# Patient Record
Sex: Female | Born: 1955 | Race: White | Hispanic: No | Marital: Married | Smoking: Former smoker
Health system: Southern US, Community
[De-identification: ages and names within clinical notes are randomized; demographics above are authoritative.]

## PROBLEM LIST (undated history)

## (undated) DIAGNOSIS — L570 Actinic keratosis: Secondary | ICD-10-CM

## (undated) DIAGNOSIS — E785 Hyperlipidemia, unspecified: Secondary | ICD-10-CM

## (undated) DIAGNOSIS — C801 Malignant (primary) neoplasm, unspecified: Secondary | ICD-10-CM

## (undated) DIAGNOSIS — K219 Gastro-esophageal reflux disease without esophagitis: Secondary | ICD-10-CM

## (undated) DIAGNOSIS — G43909 Migraine, unspecified, not intractable, without status migrainosus: Secondary | ICD-10-CM

## (undated) HISTORY — PX: CERVICAL CERCLAGE: SHX1329

## (undated) HISTORY — DX: Gastro-esophageal reflux disease without esophagitis: K21.9

## (undated) HISTORY — DX: Migraine, unspecified, not intractable, without status migrainosus: G43.909

## (undated) HISTORY — DX: Hyperlipidemia, unspecified: E78.5

## (undated) HISTORY — DX: Actinic keratosis: L57.0

---

## 1992-07-09 HISTORY — PX: CHOLECYSTECTOMY: SHX55

## 1994-07-09 HISTORY — PX: HERNIA REPAIR: SHX51

## 1994-07-09 HISTORY — PX: OTHER SURGICAL HISTORY: SHX169

## 1996-07-09 HISTORY — PX: EYE SURGERY: SHX253

## 2003-07-10 HISTORY — PX: LEFT OOPHORECTOMY: SHX1961

## 2004-04-21 ENCOUNTER — Ambulatory Visit: Payer: Self-pay | Admitting: Specialist

## 2004-04-26 ENCOUNTER — Ambulatory Visit: Payer: Self-pay | Admitting: Specialist

## 2004-06-06 ENCOUNTER — Ambulatory Visit: Payer: Self-pay | Admitting: Unknown Physician Specialty

## 2004-06-12 ENCOUNTER — Ambulatory Visit: Payer: Self-pay | Admitting: Unknown Physician Specialty

## 2005-05-22 ENCOUNTER — Ambulatory Visit: Payer: Self-pay | Admitting: Specialist

## 2006-06-06 ENCOUNTER — Ambulatory Visit: Payer: Self-pay | Admitting: Specialist

## 2007-05-13 ENCOUNTER — Ambulatory Visit: Payer: Self-pay | Admitting: Specialist

## 2007-06-10 ENCOUNTER — Ambulatory Visit: Payer: Self-pay | Admitting: Specialist

## 2008-04-13 DIAGNOSIS — C4491 Basal cell carcinoma of skin, unspecified: Secondary | ICD-10-CM

## 2008-04-13 HISTORY — DX: Basal cell carcinoma of skin, unspecified: C44.91

## 2008-06-10 ENCOUNTER — Ambulatory Visit: Payer: Self-pay | Admitting: Specialist

## 2008-06-16 ENCOUNTER — Ambulatory Visit: Payer: Self-pay | Admitting: Specialist

## 2009-03-08 DIAGNOSIS — C44310 Basal cell carcinoma of skin of unspecified parts of face: Secondary | ICD-10-CM

## 2009-03-08 HISTORY — DX: Basal cell carcinoma of skin of unspecified parts of face: C44.310

## 2009-06-14 ENCOUNTER — Ambulatory Visit: Payer: Self-pay | Admitting: Specialist

## 2010-06-27 ENCOUNTER — Ambulatory Visit: Payer: Self-pay | Admitting: Specialist

## 2011-07-05 ENCOUNTER — Ambulatory Visit: Payer: Self-pay | Admitting: Specialist

## 2012-04-11 ENCOUNTER — Ambulatory Visit: Payer: Self-pay

## 2012-07-07 ENCOUNTER — Ambulatory Visit: Payer: Self-pay | Admitting: Specialist

## 2012-07-10 ENCOUNTER — Ambulatory Visit: Payer: Self-pay | Admitting: Specialist

## 2013-07-08 ENCOUNTER — Ambulatory Visit: Payer: Self-pay | Admitting: Specialist

## 2014-03-29 DIAGNOSIS — C44519 Basal cell carcinoma of skin of other part of trunk: Secondary | ICD-10-CM

## 2014-03-29 HISTORY — DX: Basal cell carcinoma of skin of other part of trunk: C44.519

## 2014-07-20 ENCOUNTER — Ambulatory Visit: Payer: Self-pay | Admitting: Specialist

## 2014-09-13 ENCOUNTER — Ambulatory Visit: Payer: Self-pay | Admitting: Specialist

## 2014-11-16 ENCOUNTER — Other Ambulatory Visit: Payer: Self-pay | Admitting: Unknown Physician Specialty

## 2014-11-16 DIAGNOSIS — R140 Abdominal distension (gaseous): Secondary | ICD-10-CM

## 2014-11-17 ENCOUNTER — Other Ambulatory Visit: Payer: Self-pay

## 2014-11-17 ENCOUNTER — Ambulatory Visit
Admission: RE | Admit: 2014-11-17 | Discharge: 2014-11-17 | Disposition: A | Discharge status: Home / Self Care | Payer: PRIVATE HEALTH INSURANCE | Source: Ambulatory Visit | Attending: Unknown Physician Specialty | Admitting: Unknown Physician Specialty

## 2014-11-17 DIAGNOSIS — Z90721 Acquired absence of ovaries, unilateral: Secondary | ICD-10-CM | POA: Diagnosis not present

## 2014-11-17 DIAGNOSIS — R140 Abdominal distension (gaseous): Secondary | ICD-10-CM | POA: Diagnosis not present

## 2014-11-19 ENCOUNTER — Other Ambulatory Visit: Payer: Self-pay

## 2014-11-19 ENCOUNTER — Ambulatory Visit: Payer: Self-pay

## 2014-12-13 ENCOUNTER — Ambulatory Visit: Payer: PRIVATE HEALTH INSURANCE | Admitting: Anesthesiology

## 2014-12-13 ENCOUNTER — Encounter
Admission: RE | Disposition: A | Discharge status: Home / Self Care | Payer: Self-pay | Source: Ambulatory Visit | Attending: Unknown Physician Specialty

## 2014-12-13 ENCOUNTER — Encounter: Payer: Self-pay | Admitting: Anesthesiology

## 2014-12-13 ENCOUNTER — Ambulatory Visit
Admission: RE | Admit: 2014-12-13 | Discharge: 2014-12-13 | Disposition: A | Discharge status: Home / Self Care | Payer: PRIVATE HEALTH INSURANCE | Source: Ambulatory Visit | Attending: Unknown Physician Specialty | Admitting: Unknown Physician Specialty

## 2014-12-13 DIAGNOSIS — D125 Benign neoplasm of sigmoid colon: Secondary | ICD-10-CM | POA: Diagnosis not present

## 2014-12-13 DIAGNOSIS — I10 Essential (primary) hypertension: Secondary | ICD-10-CM | POA: Diagnosis not present

## 2014-12-13 DIAGNOSIS — E785 Hyperlipidemia, unspecified: Secondary | ICD-10-CM | POA: Insufficient documentation

## 2014-12-13 DIAGNOSIS — Z1211 Encounter for screening for malignant neoplasm of colon: Secondary | ICD-10-CM | POA: Insufficient documentation

## 2014-12-13 DIAGNOSIS — Z79899 Other long term (current) drug therapy: Secondary | ICD-10-CM | POA: Insufficient documentation

## 2014-12-13 DIAGNOSIS — K648 Other hemorrhoids: Secondary | ICD-10-CM | POA: Diagnosis not present

## 2014-12-13 DIAGNOSIS — Z87891 Personal history of nicotine dependence: Secondary | ICD-10-CM | POA: Diagnosis not present

## 2014-12-13 DIAGNOSIS — Z800 Family history of malignant neoplasm of digestive organs: Secondary | ICD-10-CM | POA: Insufficient documentation

## 2014-12-13 DIAGNOSIS — Z7982 Long term (current) use of aspirin: Secondary | ICD-10-CM | POA: Diagnosis not present

## 2014-12-13 DIAGNOSIS — Z91041 Radiographic dye allergy status: Secondary | ICD-10-CM | POA: Insufficient documentation

## 2014-12-13 HISTORY — PX: COLONOSCOPY: SHX5424

## 2014-12-13 SURGERY — COLONOSCOPY
Anesthesia: General

## 2014-12-13 MED ORDER — FENTANYL CITRATE (PF) 100 MCG/2ML IJ SOLN
INTRAMUSCULAR | Status: DC | PRN
Start: 2014-12-13 — End: 2014-12-13
  Administered 2014-12-13: 50 ug via INTRAVENOUS

## 2014-12-13 MED ORDER — PROPOFOL 10 MG/ML IV BOLUS
INTRAVENOUS | Status: DC | PRN
  Administered 2014-12-13: 60 mg via INTRAVENOUS

## 2014-12-13 MED ORDER — PROPOFOL INFUSION 10 MG/ML OPTIME
INTRAVENOUS | Status: DC | PRN
  Administered 2014-12-13: 120 ug/kg/min via INTRAVENOUS

## 2014-12-13 MED ORDER — MIDAZOLAM HCL 2 MG/2ML IJ SOLN
INTRAMUSCULAR | Status: DC | PRN
  Administered 2014-12-13 (×2): 1 mg via INTRAVENOUS

## 2014-12-13 MED ORDER — SODIUM CHLORIDE 0.9 % IV SOLN: INTRAVENOUS | Status: DC

## 2014-12-13 MED ORDER — SODIUM CHLORIDE 0.9 % IV SOLN
INTRAVENOUS | Status: DC
  Administered 2014-12-13: 14:00:00 via INTRAVENOUS

## 2014-12-13 MED ORDER — LIDOCAINE HCL (CARDIAC) 20 MG/ML IV SOLN
INTRAVENOUS | Status: DC | PRN
  Administered 2014-12-13: 60 mg via INTRAVENOUS

## 2014-12-13 NOTE — Op Note (Signed)
[facility] Gastroenterology Patient Name: Brianna Calhoun Procedure Date: 12/13/2014 2:09 PM MRN: [id number] Account #: [id number] Date of Birth: 10/26/1955 Admit Type: Outpatient Age: 59 Room: [facility] ENDO ROOM 1 Gender: Female Note Status: Finalized Procedure:         Colonoscopy Indications:       Screening patient at increased risk: Family history of                     1st-degree relative with colorectal cancer at age 22 years                     (or older) Providers:         Manya Silvas, MD Referring MD:      Lucas Mallow, MD (Referring MD) Medicines:         Propofol per Anesthesia Complications:     No immediate complications. Procedure:         Pre-Anesthesia Assessment:                    - After reviewing the risks and benefits, the patient was                     deemed in satisfactory condition to undergo the procedure.                    After obtaining informed consent, the colonoscope was                     passed under direct vision. Throughout the procedure, the                     patient's blood pressure, pulse, and oxygen saturations                     were monitored continuously. The Olympus PCF-H180AL                     colonoscope ( S#: [id number] ) was introduced through the                     anus and advanced to the the cecum, identified by                     appendiceal orifice and ileocecal valve. The colonoscopy                     was performed without difficulty. The patient tolerated                     the procedure well. The quality of the bowel preparation                     was excellent. Findings:      A diminutive polyp was found in the recto-sigmoid colon. The polyp was       sessile. The polyp was removed with a jumbo cold forceps. Resection and       retrieval were complete.      Internal hemorrhoids were found during endoscopy. The hemorrhoids were       medium-sized and Grade I (internal hemorrhoids that do  not prolapse).      The exam was otherwise without abnormality. Impression:        - One diminutive polyp at the recto-sigmoid  colon.                     Resected and retrieved.                    - Internal hemorrhoids.                    - The examination was otherwise normal. Recommendation:    - Await pathology results. Manya Silvas, MD 12/13/2014 2:55:55 PM This report has been signed electronically. Number of Addenda: 0 Note Initiated On: 12/13/2014 2:09 PM Scope Withdrawal Time: 0 hours 16 minutes 9 seconds  Total Procedure Duration: 0 hours 24 minutes 16 seconds       [facility]

## 2014-12-13 NOTE — H&P (Signed)
Primary Care Physician:  No PCP Per Patient Primary Gastroenterologist:  Dr. Vira Agar  Pre-Procedure History & Physical: HPI:  Brianna Calhoun is a 59 y.o. female is here for an colonoscopy.   History reviewed. No pertinent past medical history.  Past Surgical History  Procedure Laterality Date  . Cholecystectomy      Prior to Admission medications   Medication Sig Start Date End Date Taking? Authorizing Provider  ALPRAZolam Duanne Moron) 0.25 MG tablet Take 0.25 mg by mouth at bedtime as needed for anxiety.   Yes Historical Provider, MD  aspirin EC 81 MG tablet Take 81 mg by mouth 2 (two) times daily.   Yes Historical Provider, MD  butalbital-aspirin-caffeine [facility]) 50-325-40 MG per capsule Take 1-2 capsules by mouth every 4 (four) hours as needed for migraine.  12/03/13  Yes Historical Provider, MD  cycloSPORINE (RESTASIS) 0.05 % ophthalmic emulsion Place 1 drop into both eyes 2 (two) times daily. 12/31/13  Yes Historical Provider, MD  escitalopram (LEXAPRO) 10 MG tablet Take 10 mg by mouth daily.   Yes Historical Provider, MD  naproxen sodium (ANAPROX) 220 MG tablet Take 440 mg by mouth 2 (two) times daily as needed (headache).   Yes Historical Provider, MD  NON FORMULARY Take 1 capsule by mouth daily. T3-25, T4-60   Yes Historical Provider, MD  NON FORMULARY Take 25,000 Units by mouth every Monday, Wednesday, and Friday. Vitamin D from compounding pharmacy   Yes Historical Provider, MD  NON FORMULARY Apply 1 application topically 2 (two) times daily. Bi-est 70/30 5mg /ml. Estrogen supplement   Yes Historical Provider, MD  NON FORMULARY Take 1 tablet by mouth daily. Hydroxocobalamin /folic acid/ pyroxidine   Yes Historical Provider, MD  NON FORMULARY Take 2 capsules by mouth 2 (two) times daily. cylysine   Yes Historical Provider, MD  NON FORMULARY Take 6.25 mg by mouth daily. ioradol   Yes Historical Provider, MD  Nutritional Supplements (DHEA PO) Take 5 mg by mouth daily.   Yes Historical  Provider, MD  Olopatadine HCl 0.2 % SOLN Place 1 drop into both eyes daily as needed. 09/14/10  Yes Historical Provider, MD  progesterone (PROMETRIUM) 100 MG capsule Take 100 mg by mouth at bedtime.   Yes Historical Provider, MD  rosuvastatin (CRESTOR) 20 MG tablet Take 20 mg by mouth every evening.   Yes Historical Provider, MD    Allergies as of 11/15/2014  . (Not on File)    History reviewed. No pertinent family history.  History   Social History  . Marital Status: Married    Spouse Name: N/A  . Number of Children: N/A  . Years of Education: N/A   Occupational History  . Not on file.   Social History Main Topics  . Smoking status: Never Smoker   . Smokeless tobacco: Not on file  . Alcohol Use: 2.4 oz/week    4 Glasses of wine per week  . Drug Use: No  . Sexual Activity: Not on file   Other Topics Concern  . Not on file   Social History Narrative  . No narrative on file    Review of Systems: See HPI, otherwise negative ROS, adhesions, mild constipation  Physical Exam: BP 126/73 mmHg  Pulse 66  Temp(Src) 98.3 F (36.8 C) (Tympanic)  Resp 14  Ht 5' 5.5" (1.664 m)  Wt 65.772 kg (145 lb)  BMI 23.75 kg/m2  SpO2 99% General:   Alert,  pleasant and cooperative in NAD Head:  Normocephalic and atraumatic. Neck:  Supple; no masses or thyromegaly. Lungs:  Clear throughout to auscultation.    Heart:  Regular rate and rhythm. Abdomen:  Soft, nontender and nondistended. Normal bowel sounds, without guarding, and without rebound.   Neurologic:  Alert and  oriented x4;  grossly normal neurologically.  Impression/Plan: Brianna Calhoun is here for an colonoscopy to be performed for family history of colon cancer in father.  Risks, benefits, limitations, and alternatives regarding  colonoscopy have been reviewed with the patient.  Questions have been answered.  All parties agreeable.   Gaylyn Cheers, MD  12/13/2014, 2:12 PM

## 2014-12-13 NOTE — Transfer of Care (Signed)
Immediate Anesthesia Transfer of Care Note  Patient: Brianna Calhoun  Procedure(s) Performed: Procedure(s): COLONOSCOPY (N/A)  Patient Location: Endoscopy Unit  Anesthesia Type:General  Level of Consciousness: awake, alert , oriented and patient cooperative  Airway & Oxygen Therapy: Patient Spontanous Breathing and Patient connected to nasal cannula oxygen  Post-op Assessment: Report given to RN, Post -op Vital signs reviewed and stable and Patient moving all extremities X 4  Post vital signs: Reviewed and stable  Last Vitals:  Filed Vitals:   12/13/14 1502  BP: 111/67  Pulse: 69  Temp: 35.7 C  Resp: 16    Complications: No apparent anesthesia complications

## 2014-12-13 NOTE — Anesthesia Postprocedure Evaluation (Signed)
  Anesthesia Post-op Note  Patient: Brianna Calhoun  Procedure(s) Performed: Procedure(s): COLONOSCOPY (N/A)  Anesthesia type:General  Patient location: PACU  Post pain: Pain level controlled  Post assessment: Post-op Vital signs reviewed, Patient's Cardiovascular Status Stable, Respiratory Function Stable, Patent Airway and No signs of Nausea or vomiting  Post vital signs: Reviewed and stable  Last Vitals:  Filed Vitals:   12/13/14 1502  BP: 111/67  Pulse: 69  Temp: 35.7 C  Resp: 16    Level of consciousness: awake, alert  and patient cooperative  Complications: No apparent anesthesia complications

## 2014-12-13 NOTE — Anesthesia Preprocedure Evaluation (Signed)
Anesthesia Evaluation  Patient identified by MRN, date of birth, ID band Patient awake    Reviewed: Allergy & Precautions, NPO status , Patient's Chart, lab work & pertinent test results  Airway Mallampati: II  TM Distance: >3 FB     Dental  (+) Chipped   Pulmonary          Cardiovascular     Neuro/Psych    GI/Hepatic   Endo/Other    Renal/GU      Musculoskeletal   Abdominal   Peds  Hematology   Anesthesia Other Findings   Reproductive/Obstetrics                             Anesthesia Physical Anesthesia Plan  ASA: II  Anesthesia Plan:    Post-op Pain Management:    Induction:   Airway Management Planned:   Additional Equipment:   Intra-op Plan:   Post-operative Plan:   Informed Consent: I have reviewed the patients History and Physical, chart, labs and discussed the procedure including the risks, benefits and alternatives for the proposed anesthesia with the patient or authorized representative who has indicated his/her understanding and acceptance.     Plan Discussed with:   Anesthesia Plan Comments:         Anesthesia Quick Evaluation

## 2014-12-15 LAB — SURGICAL PATHOLOGY

## 2014-12-16 ENCOUNTER — Encounter: Payer: Self-pay | Admitting: Unknown Physician Specialty

## 2015-07-12 ENCOUNTER — Other Ambulatory Visit: Payer: Self-pay | Admitting: Specialist

## 2015-07-12 DIAGNOSIS — Z1231 Encounter for screening mammogram for malignant neoplasm of breast: Secondary | ICD-10-CM

## 2015-07-28 ENCOUNTER — Ambulatory Visit: Payer: PRIVATE HEALTH INSURANCE

## 2015-08-03 ENCOUNTER — Ambulatory Visit
Admission: RE | Admit: 2015-08-03 | Discharge: 2015-08-03 | Disposition: A | Discharge status: Home / Self Care | Payer: PRIVATE HEALTH INSURANCE | Source: Ambulatory Visit | Attending: Specialist | Admitting: Specialist

## 2015-08-03 DIAGNOSIS — Z1231 Encounter for screening mammogram for malignant neoplasm of breast: Secondary | ICD-10-CM | POA: Diagnosis present

## 2016-06-22 ENCOUNTER — Other Ambulatory Visit: Payer: Self-pay | Admitting: Specialist

## 2016-06-22 DIAGNOSIS — Z1231 Encounter for screening mammogram for malignant neoplasm of breast: Secondary | ICD-10-CM

## 2016-08-03 ENCOUNTER — Ambulatory Visit
Admission: RE | Admit: 2016-08-03 | Discharge: 2016-08-03 | Disposition: A | Discharge status: Home / Self Care | Payer: BLUE CROSS/BLUE SHIELD | Source: Ambulatory Visit | Attending: Specialist | Admitting: Specialist

## 2016-08-03 DIAGNOSIS — Z1231 Encounter for screening mammogram for malignant neoplasm of breast: Secondary | ICD-10-CM | POA: Diagnosis not present

## 2016-08-06 DIAGNOSIS — L820 Inflamed seborrheic keratosis: Secondary | ICD-10-CM | POA: Diagnosis not present

## 2016-08-06 DIAGNOSIS — L308 Other specified dermatitis: Secondary | ICD-10-CM | POA: Diagnosis not present

## 2016-08-06 DIAGNOSIS — L570 Actinic keratosis: Secondary | ICD-10-CM | POA: Diagnosis not present

## 2016-08-24 DIAGNOSIS — Z9889 Other specified postprocedural states: Secondary | ICD-10-CM | POA: Diagnosis not present

## 2016-08-28 DIAGNOSIS — G43909 Migraine, unspecified, not intractable, without status migrainosus: Secondary | ICD-10-CM | POA: Diagnosis not present

## 2016-08-28 DIAGNOSIS — M791 Myalgia: Secondary | ICD-10-CM | POA: Diagnosis not present

## 2016-08-28 DIAGNOSIS — M5481 Occipital neuralgia: Secondary | ICD-10-CM | POA: Diagnosis not present

## 2016-09-18 ENCOUNTER — Telehealth: Payer: Self-pay | Admitting: General Practice

## 2016-09-18 NOTE — Telephone Encounter (Signed)
Pt called and is looking to establish as a new patient. Pt is the wife of Dr. Christophe Louis. Please advise, thank you!  Call pt @ [phone].

## 2016-09-18 NOTE — Telephone Encounter (Signed)
Yes I will accept Brianna Calhoun

## 2016-09-19 NOTE — Telephone Encounter (Signed)
Call pt and left a message to call office and make a new patient appt. With Dr. Derrel Nip.

## 2016-09-19 NOTE — Telephone Encounter (Signed)
Do you mind scheduling ? See below , Dr Derrel Nip has approved.   Thanks.

## 2016-10-15 ENCOUNTER — Ambulatory Visit (INDEPENDENT_AMBULATORY_CARE_PROVIDER_SITE_OTHER): Payer: BLUE CROSS/BLUE SHIELD | Admitting: Internal Medicine

## 2016-10-15 ENCOUNTER — Encounter: Payer: Self-pay | Admitting: Internal Medicine

## 2016-10-15 VITALS — BP 138/82 | HR 73 | Temp 97.5°F | Resp 16 | Ht 64.75 in | Wt 142.2 lb

## 2016-10-15 DIAGNOSIS — Z91041 Radiographic dye allergy status: Secondary | ICD-10-CM | POA: Diagnosis not present

## 2016-10-15 DIAGNOSIS — R7303 Prediabetes: Secondary | ICD-10-CM | POA: Diagnosis not present

## 2016-10-15 DIAGNOSIS — F418 Other specified anxiety disorders: Secondary | ICD-10-CM

## 2016-10-15 DIAGNOSIS — R310 Gross hematuria: Secondary | ICD-10-CM | POA: Diagnosis not present

## 2016-10-15 DIAGNOSIS — R1031 Right lower quadrant pain: Secondary | ICD-10-CM | POA: Diagnosis not present

## 2016-10-15 DIAGNOSIS — E785 Hyperlipidemia, unspecified: Secondary | ICD-10-CM | POA: Diagnosis not present

## 2016-10-15 DIAGNOSIS — R4589 Other symptoms and signs involving emotional state: Secondary | ICD-10-CM

## 2016-10-15 NOTE — Patient Instructions (Signed)
It was very nice to meet you1   Please make an appt for fasting labs soon  I will add the CA 125 and arrange the CT of abdomen and pelvis  I will also arrange the cardiac CT through [facility]

## 2016-10-15 NOTE — Progress Notes (Addendum)
Subjective:  Patient ID: Brianna Calhoun, female    DOB: Jul 24, 1955  Age: 61 y.o. MRN: [id number]  CC: The primary encounter diagnosis was Abdominal pain, RLQ (right lower quadrant). 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  HPI Brianna Calhoun presents for establishment of care, referred by her husband Dr. Christophe Louis.   She reports a recent episode of RLQ pain that lasted over 6-7  hours, became quite severe and was accompanied by mild nausea without vomiting that resolved spontaneously.  She reports passing 3 small clots during urination  That she did not feel were due to kidney stones. She has a history of multiple bilateral  ovarian cysts  And is s/p left oophorectomy in 2005 for same. .  A tiny right sided < 1 cm cyst was noted on 2013 CT but not seen on 2016 ultrasound. She is concerned that the bleeding may have been due to a ruptured cyst,  But she has persistent abdominal and pelvic bloating and is requesting a repeat evaluation for ovarian cancer  No FH of ovarian CA . Discussed getting ct ab and pelvis with and without  contrast , has a history of hives which developed after her last CT.     1) Hyperlipidemia:  Unclear what her untreated LDL is,  Has myalgias and brain fog with statin therapy,  Stopped crestor in March .  Muscles feel better without statin . Worried about risk of CAD.  Mother smoked, had diabetes  and had CAD diagnosed in her 30's. Patient follows a careful diet endorsed by weight watchers  Low GI,  No red meats, no sugar.  Exercises vigorously for 60 minutes daily .  2) Loss of height : noticed today during first visit with vital signs. denies lower thoracic and back pain.  Some neck and shoulder pain .  Some mid thoracic back pain.  Remote baseline DEXA 10 yrs ago . Normal.   3)  GAD:  Used lexapro and xanax to  get throught grief of loss of mother , grandmother,  stopped both last year. Does not acknowledge daily anxiety although affect is clearly anxious and she does report that she "holds a lot in , then blows up at family." Sleeping well  One bathroom break.    Social History:  Happily married, husband now retired.  Helps watch grandchildren during the week. Works  part time as a Programme researcher, broadcasting/film/video at [employer]  . Relaxes at the [place] regularly.   Multiple other issues discussed today as well (Patient brought a list)   History Brianna Calhoun has a past medical history of GERD (gastroesophageal reflux disease) and Hyperlipidemia.   She has a past surgical history that includes Colonoscopy (N/A, 12/13/2014); Cholecystectomy (1994); Cesarean section (1984, 1986, [place]); Eye surgery (1998); Hernia repair (1996); and Left oophorectomy (Left, 2005).   Her family history includes Alcohol abuse in her mother; Arthritis in her mother; Breast cancer (age of onset: 55) in her maternal aunt; COPD in her mother; Colon cancer in her father; Depression in her mother; Diabetes in her brother, father, and mother; Hearing loss in her mother; Heart disease in her mother; Hyperlipidemia in her brother, father, and mother; Hypertension in her brother, father, and mother; Kidney disease in her mother; Liver disease in her paternal grandfather; Mental illness in her sister; Skin cancer in her mother; Stroke in her  maternal grandfather; Vision loss in her mother.She reports that she quit smoking about 38 years ago. Her smoking use included Cigarettes. She has never used smokeless tobacco. She reports that she drinks about 2.4 oz of alcohol per week . She reports that she does not use drugs.  Outpatient Medications Prior to Visit  Medication Sig Dispense Refill  . aspirin EC 81 MG tablet Take 81 mg by mouth 2 (two) times daily.    . butalbital-aspirin-caffeine (FIORINAL) 50-325-40 MG per capsule Take 1-2 capsules by mouth every 4  (four) hours as needed for migraine.     . cycloSPORINE (RESTASIS) 0.05 % ophthalmic emulsion Place 1 drop into both eyes 2 (two) times daily.    . naproxen sodium (ANAPROX) 220 MG tablet Take 440 mg by mouth 2 (two) times daily as needed (headache).    . NON FORMULARY Take 6.25 mg by mouth daily. ioradol    . Olopatadine HCl 0.2 % SOLN Place 1 drop into both eyes daily as needed.    . ALPRAZolam (XANAX) 0.25 MG tablet Take 0.25 mg by mouth at bedtime as needed for anxiety.    Marland Kitchen escitalopram (LEXAPRO) 10 MG tablet Take 10 mg by mouth daily.    . NON FORMULARY Take 1 capsule by mouth daily. T3-25, T4-60    . NON FORMULARY Take 25,000 Units by mouth every Monday, Wednesday, and Friday. Vitamin D from compounding pharmacy    . NON FORMULARY Apply 1 application topically 2 (two) times daily. Bi-est 70/30 5mg /ml. Estrogen supplement    . NON FORMULARY Take 1 tablet by mouth daily. Hydroxocobalamin /folic acid/ pyroxidine    . NON FORMULARY Take 2 capsules by mouth 2 (two) times daily. cylysine    . Nutritional Supplements (DHEA PO) Take 5 mg by mouth daily.    . progesterone (PROMETRIUM) 100 MG capsule Take 100 mg by mouth at bedtime.    . rosuvastatin (CRESTOR) 20 MG tablet Take 20 mg by mouth every evening.     No facility-administered medications prior to visit.     Review of Systems:  Patient denies headache, fevers, malaise, unintentional weight loss, skin rash, eye pain, sinus congestion and sinus pain, sore throat, dysphagia,  hemoptysis , cough, dyspnea, wheezing, chest pain, palpitations, orthopnea, edema, abdominal pain, nausea, melena, diarrhea, constipation, flank pain, dysuria, hematuria, urinary  Frequency, nocturia, numbness, tingling, seizures,  Focal weakness, Loss of consciousness,  Tremor, insomnia, depression, anxiety, and suicidal ideation.     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  Will recommend use of SSRI once her health concerns are addressed in full,  Hyperlipidemia LDL goal <130    She has no real risk factors for cad.  Fasting level ordered. If borderline, given her desire to avoid statins,  Discussed referring to [facility] for cardiac CT      Prediabetes    Prior a1c was 5.7 or 5.8,  Repeating now,  No change to diet or exercise,       Right lower quadrant pain    Etiology unclear.  Resolved spontaneously , was accompanied by passing of clots.   Unclear if she had hematuria or vaginal bleeding.  Last ultrasound May 2016 noted normal endometrium, surgically absent L ovary and no abnormality of right ovary. Ct abd and pelvis with and without contrast ordered for evaluation of bladder, kidneys, bowels and reporductive organs along with ca 125 .  She will require pre procedure medication with benadryl and prednisone to prevent hives which occurred with 2013 ct         Other Visit Diagnoses    Abdominal pain, RLQ (right lower quadrant)    -  Primary   Relevant Orders   CT ABDOMEN PELVIS W WO CONTRAST   Hematuria, gross       Relevant Orders   CT ABDOMEN PELVIS W WO CONTRAST     A total of 60 minutes was spent with patient more than half of which was spent in counseling patient on the above mentioned issues , reviewing and explaining recent labs and imaging studies done, and  coordination of care. I have discontinued Ms. Thibeaux's escitalopram, rosuvastatin, progesterone, Nutritional Supplements (DHEA PO), and ALPRAZolam. I am also having her start on predniSONE and diphenhydrAMINE. Additionally, I am having her maintain her aspirin EC, butalbital-aspirin-caffeine, NON FORMULARY, cycloSPORINE, Olopatadine HCl, naproxen sodium, ascorbic acid, vitamin B-12, co-enzyme Q-10, Probiotic Product (PROBIOTIC ADVANCED PO), and cholecalciferol.  Meds ordered this encounter  Medications  . ascorbic acid (VITAMIN C) 250 MG tablet    Sig: Take 250 mg by mouth daily.  . vitamin B-12 (CYANOCOBALAMIN) 1000 MCG tablet    Sig: Take 1,000 mcg by mouth daily.  Marland Kitchen co-enzyme Q-10 30 MG capsule    Sig: Take 100 mg by mouth daily.  . Probiotic Product (PROBIOTIC ADVANCED PO)    Sig: Take by mouth at bedtime.  . cholecalciferol (VITAMIN D) 1000 units tablet    Sig: Take 1,000 Units by mouth daily.  . predniSONE (DELTASONE) 50 MG tablet    Sig: One tablet by mouth 13 hours,   7 hours,  and 1 hour prior to contrasted procedure    Dispense:  3 tablet    Refill:  1  . diphenhydrAMINE (BENADRYL) 25 mg capsule    Sig: 2 capsules by mouth one hour prior to contrasted procedure    Dispense:  30 capsule    Refill:  0    Medications Discontinued During This Encounter  Medication Reason  . ALPRAZolam (XANAX) 0.25 MG tablet Patient has not taken in last 30 days  . escitalopram (LEXAPRO) 10 MG tablet Patient has not taken in last 30 days  . NON FORMULARY Patient has not taken in last 30 days  . NON FORMULARY Patient has not taken in last 30 days  . NON FORMULARY Patient has not taken in last 30 days  . NON FORMULARY Patient has not taken in last 30 days  . NON FORMULARY Patient has not taken in last 30 days  . Nutritional Supplements (DHEA PO) Patient has not taken in last 30 days  . progesterone (Gregory)  100 MG capsule Patient has not taken in last 30 days  . rosuvastatin (CRESTOR) 20 MG  tablet Patient has not taken in last 30 days    Follow-up: Return in about 3 months (around 01/14/2017).   Crecencio Mc, MD

## 2016-10-15 NOTE — Progress Notes (Signed)
Pre visit review using our clinic review tool, if applicable. No additional management support is needed unless otherwise documented below in the visit note. 

## 2016-10-16 ENCOUNTER — Other Ambulatory Visit: Payer: Self-pay | Admitting: Internal Medicine

## 2016-10-16 ENCOUNTER — Encounter: Payer: Self-pay | Admitting: Internal Medicine

## 2016-10-16 ENCOUNTER — Telehealth: Payer: Self-pay | Admitting: *Deleted

## 2016-10-16 ENCOUNTER — Other Ambulatory Visit (INDEPENDENT_AMBULATORY_CARE_PROVIDER_SITE_OTHER): Payer: BLUE CROSS/BLUE SHIELD

## 2016-10-16 DIAGNOSIS — E785 Hyperlipidemia, unspecified: Secondary | ICD-10-CM

## 2016-10-16 DIAGNOSIS — R102 Pelvic and perineal pain: Secondary | ICD-10-CM

## 2016-10-16 DIAGNOSIS — E559 Vitamin D deficiency, unspecified: Secondary | ICD-10-CM | POA: Diagnosis not present

## 2016-10-16 DIAGNOSIS — F411 Generalized anxiety disorder: Secondary | ICD-10-CM | POA: Insufficient documentation

## 2016-10-16 DIAGNOSIS — R1030 Lower abdominal pain, unspecified: Secondary | ICD-10-CM

## 2016-10-16 DIAGNOSIS — R109 Unspecified abdominal pain: Secondary | ICD-10-CM

## 2016-10-16 DIAGNOSIS — R7303 Prediabetes: Secondary | ICD-10-CM | POA: Insufficient documentation

## 2016-10-16 DIAGNOSIS — R7301 Impaired fasting glucose: Secondary | ICD-10-CM | POA: Diagnosis not present

## 2016-10-16 DIAGNOSIS — R1031 Right lower quadrant pain: Secondary | ICD-10-CM | POA: Insufficient documentation

## 2016-10-16 DIAGNOSIS — Z91041 Radiographic dye allergy status: Secondary | ICD-10-CM | POA: Insufficient documentation

## 2016-10-16 DIAGNOSIS — R5383 Other fatigue: Secondary | ICD-10-CM

## 2016-10-16 DIAGNOSIS — R7989 Other specified abnormal findings of blood chemistry: Secondary | ICD-10-CM

## 2016-10-16 LAB — COMPREHENSIVE METABOLIC PANEL
ALT: 19 U/L (ref 0–35)
AST: 19 U/L (ref 0–37)
Albumin: 4.5 g/dL (ref 3.5–5.2)
Alkaline Phosphatase: 67 U/L (ref 39–117)
BILIRUBIN TOTAL: 0.8 mg/dL (ref 0.2–1.2)
BUN: 23 mg/dL (ref 6–23)
CALCIUM: 9.5 mg/dL (ref 8.4–10.5)
CHLORIDE: 106 meq/L (ref 96–112)
CO2: 30 meq/L (ref 19–32)
Creatinine, Ser: 0.8 mg/dL (ref 0.40–1.20)
GFR: 77.02 mL/min (ref >60.00)
Glucose, Bld: 112 mg/dL — ABNORMAL HIGH (ref 70–99)
Potassium: 4.4 mEq/L (ref 3.5–5.1)
Sodium: 142 mEq/L (ref 135–145)
Total Protein: 6.7 g/dL (ref 6.0–8.3)

## 2016-10-16 LAB — LIPID PANEL
CHOL/HDL RATIO: 5
Cholesterol: 262 mg/dL — ABNORMAL HIGH (ref 0–200)
HDL: 51.3 mg/dL (ref >39.00)
LDL Cholesterol: 189 mg/dL — ABNORMAL HIGH (ref 0–99)
NonHDL: 210.41
TRIGLYCERIDES: 107 mg/dL (ref 0.0–149.0)
VLDL: 21.4 mg/dL (ref 0.0–40.0)

## 2016-10-16 LAB — HEMOGLOBIN A1C: HEMOGLOBIN A1C: 5.5 % (ref 4.6–6.5)

## 2016-10-16 LAB — VITAMIN D 25 HYDROXY (VIT D DEFICIENCY, FRACTURES): VITD: 55.72 ng/mL (ref 30.00–100.00)

## 2016-10-16 LAB — CBC WITH DIFFERENTIAL/PLATELET
BASOS ABS: 0 10*3/uL (ref 0.0–0.1)
Basophils Relative: 1 % (ref 0.0–3.0)
EOS PCT: 2.4 % (ref 0.0–5.0)
Eosinophils Absolute: 0.1 10*3/uL (ref 0.0–0.7)
HEMATOCRIT: 45.6 % (ref 36.0–46.0)
Hemoglobin: 15.3 g/dL — ABNORMAL HIGH (ref 12.0–15.0)
LYMPHS PCT: 31.3 % (ref 12.0–46.0)
Lymphs Abs: 1.4 10*3/uL (ref 0.7–4.0)
MCHC: 33.6 g/dL (ref 30.0–36.0)
MCV: 93.1 fl (ref 78.0–100.0)
MONOS PCT: 10.2 % (ref 3.0–12.0)
Monocytes Absolute: 0.4 10*3/uL (ref 0.1–1.0)
NEUTROS ABS: 2.4 10*3/uL (ref 1.4–7.7)
Neutrophils Relative %: 55.1 % (ref 43.0–77.0)
PLATELETS: 269 10*3/uL (ref 150.0–400.0)
RBC: 4.9 Mil/uL (ref 3.87–5.11)
RDW: 13 % (ref 11.5–15.5)
WBC: 4.4 10*3/uL (ref 4.0–10.5)

## 2016-10-16 LAB — TSH: TSH: 5.36 u[IU]/mL — ABNORMAL HIGH (ref 0.35–4.50)

## 2016-10-16 MED ORDER — PREDNISONE 50 MG PO TABS: ORAL_TABLET | ORAL | 1 refills | Status: DC

## 2016-10-16 MED ORDER — DIPHENHYDRAMINE HCL 25 MG PO CAPS: ORAL_CAPSULE | ORAL | 0 refills | Status: DC

## 2016-10-16 NOTE — Assessment & Plan Note (Addendum)
Etiology unclear.  Resolved spontaneously , was accompanied by passing of clots.   Unclear if she had hematuria or vaginal bleeding.  Last ultrasound May 2016 noted normal endometrium, surgically absent L ovary and no abnormality of right ovary. Ct abd and pelvis with and without contrast ordered for evaluation of bladder, kidneys, bowels and reporductive organs along with ca 125 .  She will require pre procedure medication with benadryl and prednisone to prevent hives which occurred with 2013 ct

## 2016-10-16 NOTE — Telephone Encounter (Signed)
Pt came in for labs this morning. Need "future" orders. Pt also mentioned a different type of lipid test that you may order. I drew several labs on her to hopefully cover what you may order. She also mentioned a CA125.

## 2016-10-16 NOTE — Assessment & Plan Note (Signed)
She has no real risk factors for cad.  Fasting level ordered. If borderline, given her desire to avoid statins,  Discussed referring to [place] for cardiac CT

## 2016-10-16 NOTE — Assessment & Plan Note (Signed)
She reports and acute phase reaction to last ct resulting in hives, so we will pre medicate for her upcoming CT scan with 50 mg prednisone taken 13 , 7 and 1 hour prior to CT, along with dipendyramine 50 mg one hour before procedure

## 2016-10-16 NOTE — Assessment & Plan Note (Signed)
Prior a1c was 5.7 or 5.8,  Repeating now,  No change to diet or exercise,

## 2016-10-16 NOTE — Addendum Note (Signed)
Addended by: Crecencio Mc on: 10/16/2016 09:46 AM   Modules accepted: Orders

## 2016-10-16 NOTE — Telephone Encounter (Signed)
Thank you :)

## 2016-10-16 NOTE — Assessment & Plan Note (Signed)
She has a previous trial of lexapro during grief following the death of her other,  And appears to have excessive anxiety regarding her health and the aging process.  Will recommend use of SSRI once her health concerns are addressed in full,

## 2016-10-16 NOTE — Telephone Encounter (Signed)
Labs ordered.  I do not see the utility in the "special " lipid test given her desire to avoid statins unless she has objective evidence of heart disease,  Which the lipoprotein panel will not provide.  The fasting lipid panel is enough.

## 2016-10-17 ENCOUNTER — Encounter: Payer: Self-pay | Admitting: Internal Medicine

## 2016-10-17 LAB — CA 125: CA 125: 13 U/mL (ref <35)

## 2016-10-17 NOTE — Addendum Note (Signed)
Addended by: Crecencio Mc on: 10/17/2016 01:16 PM   Modules accepted: Orders

## 2016-10-17 NOTE — Addendum Note (Signed)
Addended by: Crecencio Mc on: 10/17/2016 01:22 PM   Modules accepted: Orders

## 2016-10-17 NOTE — Assessment & Plan Note (Addendum)
Untreated lipid panel reviewd,  10 yr risk using the FRC is 12.7% . Refer to [facility] for risk stratification with cardiac ct   Lab Results  Component Value Date   CHOL 262 (H) 10/16/2016   HDL 51.30 10/16/2016   LDLCALC 189 (H) 10/16/2016   TRIG 107.0 10/16/2016   CHOLHDL 5 10/16/2016

## 2016-10-22 NOTE — Telephone Encounter (Signed)
Dr. Derrel Nip, Does she need to take the antihistamines prior to her drinking her contrast?

## 2016-10-23 ENCOUNTER — Ambulatory Visit: Payer: BLUE CROSS/BLUE SHIELD | Admitting: Cardiology

## 2016-10-24 ENCOUNTER — Telehealth: Payer: Self-pay

## 2016-10-24 DIAGNOSIS — R310 Gross hematuria: Secondary | ICD-10-CM

## 2016-10-24 DIAGNOSIS — R1031 Right lower quadrant pain: Secondary | ICD-10-CM

## 2016-10-24 NOTE — Telephone Encounter (Signed)
Spoke with Brianna Calhoun at [facility] scheduling patient has been re-scheduled order is complete.

## 2016-10-24 NOTE — Telephone Encounter (Addendum)
Per [place], CT Tech  study needs to be changed to CT /Abdomen Pelvis with Contrast  Only .Please forward message back to me so I can call Peggy at Scheduling back so they can schedule patient they have spot held for patient. Thanks.

## 2016-10-25 DIAGNOSIS — M5481 Occipital neuralgia: Secondary | ICD-10-CM | POA: Diagnosis not present

## 2016-10-28 DIAGNOSIS — J40 Bronchitis, not specified as acute or chronic: Secondary | ICD-10-CM | POA: Diagnosis not present

## 2016-10-31 ENCOUNTER — Ambulatory Visit: Payer: BLUE CROSS/BLUE SHIELD | Admitting: Internal Medicine

## 2016-11-01 ENCOUNTER — Encounter: Payer: Self-pay | Admitting: Cardiology

## 2016-11-01 ENCOUNTER — Ambulatory Visit (INDEPENDENT_AMBULATORY_CARE_PROVIDER_SITE_OTHER): Payer: BLUE CROSS/BLUE SHIELD | Admitting: Cardiology

## 2016-11-01 VITALS — BP 120/82 | HR 71 | Ht 65.0 in | Wt 138.5 lb

## 2016-11-01 DIAGNOSIS — E7849 Other hyperlipidemia: Secondary | ICD-10-CM

## 2016-11-01 DIAGNOSIS — E784 Other hyperlipidemia: Secondary | ICD-10-CM

## 2016-11-01 MED ORDER — ROSUVASTATIN CALCIUM 20 MG PO TABS: 20.0000 mg | ORAL_TABLET | ORAL | 1 refills | Status: DC

## 2016-11-01 NOTE — Patient Instructions (Signed)
Medication Instructions:  Your physician has recommended you make the following change in your medication:  1. START Crestor 20 mg every other day 2. START Coenzyme Q10 (over the counter)  Follow-Up: Your physician recommends that you schedule a follow-up appointment as needed.  It was a pleasure seeing you today here in the office. Please do not hesitate to give [place] a call back if you have any further questions. [place], BSN

## 2016-11-01 NOTE — Progress Notes (Signed)
Cardiology Office Note   Date:  11/02/2016   ID:  Brianna [place], DOB September 05, 1955, MRN [id number]  Referring Doctor:  Crecencio Mc, MD   Cardiologist:   Wende Bushy, MD   Reason for consultation:  Chief Complaint  Patient presents with  . other    Dr. Derrel Nip referred over. Cholesterol has always been elevated and tried almost ever cholesterol medicine.  Concerned for diabetes.  Patient wanting cardiac CT.  Meds verbally reviewed with patient.        History of Present Illness: Brianna [place] is a 61 y.o. female who is being seen today for the evaluation of hyperlipidemia at the request of Crecencio Mc, MD.  Pt wondering if she should do cardiac CT to assess risk  Pt has hyperlipidemia, tried several statins. Crestor 20mg  po qhs prob helped her best. Prob brought her ldl to 90s. However, she is concerned about developing mm pains due to statins. She admits however that they may have been other things going on at that time she was on statins - stress, family issues. Not so sure if symptoms were all statin related. She is also concerned about risk for DM.   She is otherwise ok and no CP, SOB, palpitations, She regularly exercises and has no exertional symptoms. She tries to stick to heart healthy diet.  ROS:  Please see the history of present illness. Aside from mentioned under HPI, all other systems are reviewed and negative.     Past Medical History:  Diagnosis Date  . GERD (gastroesophageal reflux disease)   . Hyperlipidemia     Past Surgical History:  Procedure Laterality Date  . [place]  . CHOLECYSTECTOMY  1994  . COLONOSCOPY N/A 12/13/2014   Procedure: COLONOSCOPY;  Surgeon: Manya Silvas, MD;  Location: [facility] ENDOSCOPY;  Service: Endoscopy;  Laterality: N/A;  . EYE SURGERY  1998  . HERNIA REPAIR  1996   left hernia repair  . LEFT OOPHORECTOMY Left 2005   multiple complex cysts     reports that she quit smoking about 38  years ago. Her smoking use included Cigarettes. She has never used smokeless tobacco. She reports that she drinks about 2.4 oz of alcohol per week . She reports that she does not use drugs.   family history includes Alcohol abuse in her mother; Arthritis in her mother; Breast cancer (age of onset: 20) in her maternal aunt; COPD in her mother; Colon cancer in her father; Depression in her mother; Diabetes in her brother, father, and mother; Hearing loss in her mother; Heart disease in her mother; Hyperlipidemia in her brother, father, and mother; Hypertension in her brother, father, and mother; Kidney disease in her mother; Liver disease in her paternal grandfather; Mental illness in her sister; Skin cancer in her mother; Stroke in her maternal grandfather; Vision loss in her mother.   Outpatient Medications Prior to Visit  Medication Sig Dispense Refill  . ascorbic acid (VITAMIN C) 250 MG tablet Take 250 mg by mouth daily.    Marland Kitchen aspirin EC 81 MG tablet Take 81 mg by mouth 2 (two) times daily.    . butalbital-aspirin-caffeine (FIORINAL) 50-325-40 MG per capsule Take 1-2 capsules by mouth every 4 (four) hours as needed for migraine.     . cholecalciferol (VITAMIN D) 1000 units tablet Take 1,000 Units by mouth daily.    Marland Kitchen co-enzyme Q-10 30 MG capsule Take 100 mg by mouth daily.    Marland Kitchen  cycloSPORINE (RESTASIS) 0.05 % ophthalmic emulsion Place 1 drop into both eyes 2 (two) times daily.    . diphenhydrAMINE (BENADRYL) 25 mg capsule 2 capsules by mouth one hour prior to contrasted procedure 30 capsule 0  . naproxen sodium (ANAPROX) 220 MG tablet Take 440 mg by mouth 2 (two) times daily as needed (headache).    . NON FORMULARY Take 6.25 mg by mouth daily. ioradol    . Olopatadine HCl 0.2 % SOLN Place 1 drop into both eyes daily as needed.    . predniSONE (DELTASONE) 50 MG tablet One tablet by mouth 13 hours,   7 hours,  and 1 hour prior to contrasted procedure 3 tablet 1  . Probiotic Product (PROBIOTIC ADVANCED  PO) Take by mouth at bedtime.    . vitamin B-12 (CYANOCOBALAMIN) 1000 MCG tablet Take 1,000 mcg by mouth daily.     No facility-administered medications prior to visit.      Allergies: Amoxicillin and Contrast media [iodinated diagnostic agents]    PHYSICAL EXAM: VS:  BP 120/82 (BP Location: Left Arm, Patient Position: Sitting, Cuff Size: Normal)   Pulse 71   Ht 5\' 5"  (1.651 m)   Wt 138 lb 8 oz (62.8 kg)   BMI 23.05 kg/m  , Body mass index is 23.05 kg/m. Wt Readings from Last 3 Encounters:  11/01/16 138 lb 8 oz (62.8 kg)  10/15/16 142 lb 3.2 oz (64.5 kg)  12/13/14 145 lb (65.8 kg)    GENERAL:  well developed, well nourished, not in acute distress HEENT: normocephalic, pink conjunctivae, anicteric sclerae, no xanthelasma, normal dentition, oropharynx clear NECK:  no neck vein engorgement, JVP normal, no hepatojugular reflux, carotid upstroke brisk and symmetric, no bruit, no thyromegaly, no lymphadenopathy LUNGS:  good respiratory effort, clear to auscultation bilaterally CV:  PMI not displaced, no thrills, no lifts, S1 and S2 within normal limits, no palpable S3 or S4, no murmurs, no rubs, no gallops ABD:  Soft, nontender, nondistended, normoactive bowel sounds, no abdominal aortic bruit, no hepatomegaly, no splenomegaly MS: nontender back, no kyphosis, no scoliosis, no joint deformities EXT:  2+ DP/PT pulses, no edema, no varicosities, no cyanosis, no clubbing SKIN: warm, nondiaphoretic, normal turgor, no ulcers NEUROPSYCH: alert, oriented to person, place, and time, sensory/motor grossly intact, normal mood, appropriate affect  Recent Labs: 10/16/2016: ALT 19; BUN 23; Creatinine, Ser 0.80; Hemoglobin 15.3; Platelets 269.0; Potassium 4.4; Sodium 142; TSH 5.36   Lipid Panel    Component Value Date/Time   CHOL 262 (H) 10/16/2016 0904   TRIG 107.0 10/16/2016 0904   HDL 51.30 10/16/2016 0904   CHOLHDL 5 10/16/2016 0904   VLDL 21.4 10/16/2016 0904   LDLCALC 189 (H) 10/16/2016  0904     Other studies Reviewed:  EKG:  The ekg from 11/01/2016 was personally reviewed by me and it revealed SR 71.   Additional studies/ records that were reviewed personally reviewed by me today include: none available   ASSESSMENT AND PLAN: Hyperlipidemia LDL is 189 untreated. Already on lifestyle changes. BMI is wnl. Other Risk factors for CAD include family hx, smoking hx Benefit of statin therapy outweigh risk; lab monitoring for liver tests, glucose levels will be done as standard monitoring. Previous experience of pt consistent with good response to crestor, even on qod dosing.                       Do not forsee great benefit in doing cardiac CT calcium scoring since even if  no calcium ,will still rec statin therapy.  Discussed with pt that it can stll be done, it will be out of pocket cost anyway since not covered by insurance.  Pt verbalized understanding and agreed with retrial with rosuvastatin 20mg  po qhs. Pt will be ffup with pcp, rec cmp and flp in 3 months. May take otc coenzyme q10. Continue lifestyle changes/heart healthy lifestyle.   Current medicines are reviewed at length with the patient today.  The patient does not have concerns regarding medicines.  Labs/ tests ordered today include: No orders of the defined types were placed in this encounter.   I had a lengthy and detailed discussion with the patient regarding diagnoses, prognosis, diagnostic options, treatment options , and side effects of medications.    Disposition:   FU with Cardiology after tests   Thank you for this consultation. We will forwarding this consultation to referring physician.   I spent at least 60 minutes with the patient today and more than 50% of the time was spent counseling the patient and coordinating care.    Signed, Wende Bushy, MD  11/02/2016 12:11 AM    [place]  This note was generated in part with voice recognition software and I  apologize for any typographical errors that were not detected and corrected.

## 2016-11-02 ENCOUNTER — Ambulatory Visit: Payer: BLUE CROSS/BLUE SHIELD

## 2016-11-02 ENCOUNTER — Ambulatory Visit
Admission: RE | Admit: 2016-11-02 | Discharge: 2016-11-02 | Disposition: A | Discharge status: Home / Self Care | Payer: BLUE CROSS/BLUE SHIELD | Source: Ambulatory Visit | Attending: Internal Medicine | Admitting: Internal Medicine

## 2016-11-02 DIAGNOSIS — R1031 Right lower quadrant pain: Secondary | ICD-10-CM | POA: Diagnosis not present

## 2016-11-02 DIAGNOSIS — N200 Calculus of kidney: Secondary | ICD-10-CM | POA: Insufficient documentation

## 2016-11-02 DIAGNOSIS — R310 Gross hematuria: Secondary | ICD-10-CM | POA: Diagnosis present

## 2016-11-02 HISTORY — DX: Malignant (primary) neoplasm, unspecified: C80.1

## 2016-11-02 MED ORDER — IOPAMIDOL (ISOVUE-300) INJECTION 61%
100.0000 mL | Freq: Once | INTRAVENOUS | Status: AC | PRN
  Administered 2016-11-02: 100 mL via INTRAVENOUS

## 2016-11-02 NOTE — Addendum Note (Signed)
Addended by: Kittie Plater on: 11/02/2016 11:10 AM   Modules accepted: Orders

## 2016-11-04 ENCOUNTER — Encounter: Payer: Self-pay | Admitting: Internal Medicine

## 2016-11-05 DIAGNOSIS — L821 Other seborrheic keratosis: Secondary | ICD-10-CM | POA: Diagnosis not present

## 2016-11-05 DIAGNOSIS — L578 Other skin changes due to chronic exposure to nonionizing radiation: Secondary | ICD-10-CM | POA: Diagnosis not present

## 2016-11-05 DIAGNOSIS — L570 Actinic keratosis: Secondary | ICD-10-CM | POA: Diagnosis not present

## 2016-11-05 DIAGNOSIS — L820 Inflamed seborrheic keratosis: Secondary | ICD-10-CM | POA: Diagnosis not present

## 2016-11-12 ENCOUNTER — Ambulatory Visit: Payer: BLUE CROSS/BLUE SHIELD | Admitting: Internal Medicine

## 2016-11-28 ENCOUNTER — Other Ambulatory Visit (INDEPENDENT_AMBULATORY_CARE_PROVIDER_SITE_OTHER): Payer: BLUE CROSS/BLUE SHIELD

## 2016-11-28 DIAGNOSIS — R946 Abnormal results of thyroid function studies: Secondary | ICD-10-CM

## 2016-11-28 DIAGNOSIS — R7989 Other specified abnormal findings of blood chemistry: Secondary | ICD-10-CM

## 2016-11-29 LAB — T4 AND TSH
T4 TOTAL: 5.1 ug/dL (ref 4.5–12.0)
TSH: 2.89 u[IU]/mL (ref 0.450–4.500)

## 2016-11-30 ENCOUNTER — Encounter: Payer: Self-pay | Admitting: Internal Medicine

## 2017-01-16 ENCOUNTER — Ambulatory Visit: Payer: BLUE CROSS/BLUE SHIELD | Admitting: Internal Medicine

## 2017-01-23 ENCOUNTER — Other Ambulatory Visit: Payer: Self-pay | Admitting: Neurology

## 2017-01-23 DIAGNOSIS — G43909 Migraine, unspecified, not intractable, without status migrainosus: Secondary | ICD-10-CM | POA: Diagnosis not present

## 2017-01-23 DIAGNOSIS — M791 Myalgia: Secondary | ICD-10-CM | POA: Diagnosis not present

## 2017-01-23 DIAGNOSIS — M5481 Occipital neuralgia: Secondary | ICD-10-CM | POA: Diagnosis not present

## 2017-01-23 DIAGNOSIS — M5412 Radiculopathy, cervical region: Secondary | ICD-10-CM

## 2017-01-31 ENCOUNTER — Ambulatory Visit: Payer: BLUE CROSS/BLUE SHIELD

## 2017-03-01 ENCOUNTER — Encounter: Payer: Self-pay | Admitting: Internal Medicine

## 2017-03-01 ENCOUNTER — Ambulatory Visit (INDEPENDENT_AMBULATORY_CARE_PROVIDER_SITE_OTHER): Payer: BLUE CROSS/BLUE SHIELD | Admitting: Internal Medicine

## 2017-03-01 VITALS — BP 126/78 | HR 76 | Temp 98.3°F | Resp 15 | Ht 65.0 in | Wt 139.8 lb

## 2017-03-01 DIAGNOSIS — Z13820 Encounter for screening for osteoporosis: Secondary | ICD-10-CM | POA: Diagnosis not present

## 2017-03-01 DIAGNOSIS — M6289 Other specified disorders of muscle: Secondary | ICD-10-CM

## 2017-03-01 DIAGNOSIS — Z79899 Other long term (current) drug therapy: Secondary | ICD-10-CM | POA: Diagnosis not present

## 2017-03-01 DIAGNOSIS — Z91041 Radiographic dye allergy status: Secondary | ICD-10-CM | POA: Diagnosis not present

## 2017-03-01 DIAGNOSIS — E7800 Pure hypercholesterolemia, unspecified: Secondary | ICD-10-CM | POA: Diagnosis not present

## 2017-03-01 DIAGNOSIS — R7303 Prediabetes: Secondary | ICD-10-CM

## 2017-03-01 DIAGNOSIS — R1031 Right lower quadrant pain: Secondary | ICD-10-CM | POA: Diagnosis not present

## 2017-03-01 DIAGNOSIS — R7301 Impaired fasting glucose: Secondary | ICD-10-CM | POA: Diagnosis not present

## 2017-03-01 DIAGNOSIS — E785 Hyperlipidemia, unspecified: Secondary | ICD-10-CM | POA: Diagnosis not present

## 2017-03-01 DIAGNOSIS — E2839 Other primary ovarian failure: Secondary | ICD-10-CM | POA: Diagnosis not present

## 2017-03-01 LAB — CK: Total CK: 100 U/L (ref 7–177)

## 2017-03-01 LAB — COMPREHENSIVE METABOLIC PANEL
ALK PHOS: 58 U/L (ref 39–117)
ALT: 21 U/L (ref 0–35)
AST: 22 U/L (ref 0–37)
Albumin: 4.5 g/dL (ref 3.5–5.2)
BUN: 14 mg/dL (ref 6–23)
CO2: 31 mEq/L (ref 19–32)
CREATININE: 0.82 mg/dL (ref 0.40–1.20)
Calcium: 9.6 mg/dL (ref 8.4–10.5)
Chloride: 105 mEq/L (ref 96–112)
GFR: 74.77 mL/min (ref >60.00)
GLUCOSE: 97 mg/dL (ref 70–99)
POTASSIUM: 4.2 meq/L (ref 3.5–5.1)
SODIUM: 141 meq/L (ref 135–145)
TOTAL PROTEIN: 6.9 g/dL (ref 6.0–8.3)
Total Bilirubin: 0.5 mg/dL (ref 0.2–1.2)

## 2017-03-01 LAB — LIPID PANEL
Cholesterol: 201 mg/dL — ABNORMAL HIGH (ref 0–200)
HDL: 53.8 mg/dL (ref >39.00)
LDL Cholesterol: 130 mg/dL — ABNORMAL HIGH (ref 0–99)
NONHDL: 146.74
Total CHOL/HDL Ratio: 4
Triglycerides: 85 mg/dL (ref 0.0–149.0)
VLDL: 17 mg/dL (ref 0.0–40.0)

## 2017-03-01 NOTE — Progress Notes (Addendum)
Subjective:  Patient ID: Brianna Calhoun, female    DOB: [birth year]  Age: 61 y.o. MRN: [id number]  CC: The primary encounter diagnosis was Pure hypercholesterolemia. Diagnoses of Long-term use of high-risk medication, Screening for osteoporosis, Impaired fasting glucose, Muscle fatigue, Hyperlipidemia LDL goal <130, Prediabetes, Right lower quadrant pain, and Allergy to iodinated contrast media were also pertinent to this visit.  HPI Brianna Calhoun presents for  Follow up on multiple chronic issues and concerns including GAD, mild hyperlipidemia , and history of impaired fasting glucose   She had a contrasted CT I n [place] because of concern about ovarian cancer , which was normal except for a tiny left renal stone . No ovarian masses.  No atherosclerosis . Had a mild contrast reaction despite the pretreatment with prednisone and benadryl    Thyroid function was  normal in May  fasting glucose 112   But A1c normal  In [place]   Taking Crestor but concerned about effects on muscles .  Exercising regularly , BMI normal   Lab Results  Component Value Date   HGBA1C 5.5 10/16/2016      Outpatient Medications Prior to Visit  Medication Sig Dispense Refill  . ascorbic acid (VITAMIN C) 250 MG tablet Take 250 mg by mouth daily.    . cholecalciferol (VITAMIN D) 1000 units tablet Take 1,000 Units by mouth daily.    Marland Kitchen co-enzyme Q-10 30 MG capsule Take 100 mg by mouth daily.    . cycloSPORINE (RESTASIS) 0.05 % ophthalmic emulsion Place 1 drop into both eyes 2 (two) times daily.    . meloxicam (MOBIC) 15 MG tablet   2  . methocarbamol (ROBAXIN) 500 MG tablet   2  . Olopatadine HCl 0.2 % SOLN Place 1 drop into both eyes daily as needed.    . Probiotic Product (PROBIOTIC ADVANCED PO) Take by mouth at bedtime.    . rizatriptan (MAXALT) 10 MG tablet rizatriptan 10 mg tablet    . traMADol (ULTRAM) 50 MG tablet   0  . vitamin B-12 (CYANOCOBALAMIN) 1000 MCG tablet Take 1,000 mcg by mouth daily.    Marland Kitchen  aspirin EC 81 MG tablet Take 81 mg by mouth 2 (two) times daily.    . butalbital-aspirin-caffeine (FIORINAL) 50-325-40 MG per capsule Take 1-2 capsules by mouth every 4 (four) hours as needed for migraine.     . naproxen sodium (ANAPROX) 220 MG tablet Take 440 mg by mouth 2 (two) times daily as needed (headache).    . rosuvastatin (CRESTOR) 20 MG tablet Take 1 tablet (20 mg total) by mouth every other day. 30 tablet 1  . diphenhydrAMINE (BENADRYL) 25 mg capsule 2 capsules by mouth one hour prior to contrasted procedure (Patient not taking: Reported on 03/01/2017) 30 capsule 0  . NON FORMULARY Take 6.25 mg by mouth daily. ioradol    . predniSONE (DELTASONE) 50 MG tablet One tablet by mouth 13 hours,   7 hours,  and 1 hour prior to contrasted procedure (Patient not taking: Reported on 03/01/2017) 3 tablet 1   No facility-administered medications prior to visit.     Review of Systems;  Patient denies headache, fevers, malaise, unintentional weight loss, skin rash, eye pain, sinus congestion and sinus pain, sore throat, dysphagia,  hemoptysis , cough, dyspnea, wheezing, chest pain, palpitations, orthopnea, edema, abdominal pain, nausea, melena, diarrhea, constipation, flank pain, dysuria, hematuria, urinary  Frequency, nocturia, numbness, tingling, seizures,  Focal weakness, Loss of consciousness,  Tremor, insomnia, depression, anxiety, and  suicidal ideation.      Objective:  BP 126/78 (BP Location: Left Arm, Patient Position: Sitting, Cuff Size: Normal)   Pulse 76   Temp 98.3 F (36.8 C) (Oral)   Resp 15   Ht 5\' 5"  (1.651 m)   Wt 139 lb 12.8 oz (63.4 kg)   SpO2 98%   BMI 23.26 kg/m   BP Readings from Last 3 Encounters:  03/01/17 126/78  11/01/16 120/82  10/15/16 138/82    Wt Readings from Last 3 Encounters:  03/01/17 139 lb 12.8 oz (63.4 kg)  11/01/16 138 lb 8 oz (62.8 kg)  10/15/16 142 lb 3.2 oz (64.5 kg)    General appearance: alert, cooperative and appears stated age Ears:  normal TM's and external ear canals both ears Throat: lips, mucosa, and tongue normal; teeth and gums normal Neck: no adenopathy, no carotid bruit, supple, symmetrical, trachea midline and thyroid not enlarged, symmetric, no tenderness/mass/nodules Back: symmetric, no curvature. ROM normal. No CVA tenderness. Lungs: clear to auscultation bilaterally Heart: regular rate and rhythm, S1, S2 normal, no murmur, click, rub or gallop Abdomen: soft, non-tender; bowel sounds normal; no masses,  no organomegaly Pulses: 2+ and symmetric Skin: Skin color, texture, turgor normal. No rashes or lesions Lymph nodes: Cervical, supraclavicular, and axillary nodes normal.  Lab Results  Component Value Date   HGBA1C 5.5 10/16/2016    Lab Results  Component Value Date   CREATININE 0.82 03/01/2017   CREATININE 0.80 10/16/2016    Lab Results  Component Value Date   WBC 4.4 10/16/2016   HGB 15.3 (H) 10/16/2016   HCT 45.6 10/16/2016   PLT 269.0 10/16/2016   GLUCOSE 97 03/01/2017   CHOL 201 (H) 03/01/2017   TRIG 85.0 03/01/2017   HDL 53.80 03/01/2017   LDLCALC 130 (H) 03/01/2017   ALT 21 03/01/2017   AST 22 03/01/2017   NA 141 03/01/2017   K 4.2 03/01/2017   CL 105 03/01/2017   CREATININE 0.82 03/01/2017   BUN 14 03/01/2017   CO2 31 03/01/2017   TSH 2.890 11/28/2016   HGBA1C 5.5 10/16/2016    Ct Abdomen Pelvis W Contrast  Result Date: 11/02/2016 CLINICAL DATA:  Chronic right lower quadrant pain EXAM: CT ABDOMEN AND PELVIS WITH CONTRAST TECHNIQUE: Multidetector CT imaging of the abdomen and pelvis was performed using the standard protocol following bolus administration of intravenous contrast. CONTRAST:  133mL ISOVUE-300 IOPAMIDOL (ISOVUE-300) INJECTION 61% Mild hives were noted following the contrast injection although improved over approximately 1 hour. This was despite prior pretreatment for contrast allergy. COMPARISON:  04/11/2012 FINDINGS: Lower chest: No acute abnormality. Hepatobiliary:  No focal liver abnormality is seen. Status post cholecystectomy. No biliary dilatation. Pancreas: Unremarkable. No pancreatic ductal dilatation or surrounding inflammatory changes. Spleen: Normal in size without focal abnormality. Adrenals/Urinary Tract: Adrenal glands are unremarkable. Bladder is decompressed. Tiny upper pole nonobstructing left renal stone is seen. No obstructive changes are noted. Stomach/Bowel: Stomach is within normal limits. Appendix appears normal. No evidence of bowel wall thickening, distention, or inflammatory changes. Vascular/Lymphatic: No significant vascular findings are present. No enlarged abdominal or pelvic lymph nodes. 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 The left ovary has been surgically removed. Other: Changes of prior hernia repair are noted anteriorly. Musculoskeletal: Degenerative changes of lumbar spine are seen. IMPRESSION: Tiny nonobstructing left renal stone. No acute abnormality is noted. Electronically Signed   By: Inez Catalina M.D.   On: 11/02/2016 12:27  Assessment & Plan:   Problem List Items Addressed This Visit    Allergy to iodinated contrast media    Despite pretreatment with benadryl and prednisone, she had a mild reaction (hives only)      Hyperlipidemia LDL goal <130    LDL and triglycerides are at goal on current medications. Muscle and  liver enzymes are normal. No changes today.  Lab Results  Component Value Date   CHOL 201 (H) 03/01/2017   HDL 53.80 03/01/2017   LDLCALC 130 (H) 03/01/2017   TRIG 85.0 03/01/2017   CHOLHDL 4 03/01/2017   Lab Results  Component Value Date   ALT 21 03/01/2017   AST 22 03/01/2017   ALKPHOS 58 03/01/2017   BILITOT 0.5 03/01/2017   Lab Results  Component Value Date   CKTOTAL 100 03/01/2017          Prediabetes    She has a history of an A1cin the past of 5.7 or 5.8,  But it was 5.5 in [place]  Fasting glucose is normal.   Lab  Results  Component Value Date   HGBA1C 5.5 10/16/2016         Right lower quadrant pain    Etiology is NOT an ovarian pass per recent  CT.  she did have a tiny stone on the right.  Pain has resolved spontaneously , was accompanied by passing of clots.          Other Visit Diagnoses    Pure hypercholesterolemia    -  Primary   Relevant Orders   Lipid panel (Completed)   Long-term use of high-risk medication       Relevant Orders   Comprehensive metabolic panel (Completed)   Screening for osteoporosis       Relevant Orders   DG Bone Density (Completed)   Impaired fasting glucose       Relevant Orders   Hemoglobin A1c   Muscle fatigue       Relevant Orders   CK (Creatine Kinase) (Completed)    A total of 25 minutes of face to face time was spent with patient more than half of which was spent in counselling about the above mentioned conditions  and coordination of care  I have discontinued Hulda L. Newlon's NON FORMULARY, predniSONE, and diphenhydrAMINE. I am also having her maintain her aspirin EC, butalbital-aspirin-caffeine, cycloSPORINE, Olopatadine HCl, naproxen sodium, ascorbic acid, vitamin B-12, co-enzyme Q-10, Probiotic Product (PROBIOTIC ADVANCED PO), cholecalciferol, rosuvastatin, meloxicam, methocarbamol, rizatriptan, and traMADol.  No orders of the defined types were placed in this encounter.   Medications Discontinued During This Encounter  Medication Reason  . diphenhydrAMINE (BENADRYL) 25 mg capsule Patient has not taken in last 30 days  . NON FORMULARY Patient has not taken in last 30 days  . predniSONE (DELTASONE) 50 MG tablet Patient has not taken in last 30 days    Follow-up: No Follow-up on file.   Crecencio Mc, MD

## 2017-03-01 NOTE — Patient Instructions (Signed)
Good to see you  Checking muscle enzyme,  A1c and lipids, cmet today  See you in 6 months

## 2017-03-03 ENCOUNTER — Encounter: Payer: Self-pay | Admitting: Internal Medicine

## 2017-03-03 NOTE — Assessment & Plan Note (Signed)
LDL and triglycerides are at goal on current medications. Muscle and  liver enzymes are normal. No changes today.  Lab Results  Component Value Date   CHOL 201 (H) 03/01/2017   HDL 53.80 03/01/2017   LDLCALC 130 (H) 03/01/2017   TRIG 85.0 03/01/2017   CHOLHDL 4 03/01/2017   Lab Results  Component Value Date   ALT 21 03/01/2017   AST 22 03/01/2017   ALKPHOS 58 03/01/2017   BILITOT 0.5 03/01/2017   Lab Results  Component Value Date   CKTOTAL 100 03/01/2017

## 2017-03-03 NOTE — Assessment & Plan Note (Signed)
Etiology is NOT an ovarian pass per recent  CT.  she did have a tiny stone on the right.  Pain has resolved spontaneously , was accompanied by passing of clots.

## 2017-03-03 NOTE — Assessment & Plan Note (Signed)
Despite pretreatment with benadryl and prednisone, she had a mild reaction (hives only)

## 2017-03-03 NOTE — Assessment & Plan Note (Signed)
She has a history of an A1cin the past of 5.7 or 5.8,  But it was 5.5 in April  Fasting glucose is normal.   Lab Results  Component Value Date   HGBA1C 5.5 10/16/2016

## 2017-03-21 DIAGNOSIS — L820 Inflamed seborrheic keratosis: Secondary | ICD-10-CM | POA: Diagnosis not present

## 2017-03-21 DIAGNOSIS — Z85828 Personal history of other malignant neoplasm of skin: Secondary | ICD-10-CM | POA: Diagnosis not present

## 2017-03-21 DIAGNOSIS — D1800 Hemangioma unspecified site: Secondary | ICD-10-CM | POA: Diagnosis not present

## 2017-03-21 DIAGNOSIS — D485 Neoplasm of uncertain behavior of skin: Secondary | ICD-10-CM | POA: Diagnosis not present

## 2017-03-21 DIAGNOSIS — Z1283 Encounter for screening for malignant neoplasm of skin: Secondary | ICD-10-CM | POA: Diagnosis not present

## 2017-03-21 DIAGNOSIS — D229 Melanocytic nevi, unspecified: Secondary | ICD-10-CM | POA: Diagnosis not present

## 2017-03-21 DIAGNOSIS — L570 Actinic keratosis: Secondary | ICD-10-CM | POA: Diagnosis not present

## 2017-04-15 DIAGNOSIS — M5412 Radiculopathy, cervical region: Secondary | ICD-10-CM | POA: Diagnosis not present

## 2017-04-16 DIAGNOSIS — M436 Torticollis: Secondary | ICD-10-CM | POA: Diagnosis not present

## 2017-04-16 DIAGNOSIS — M542 Cervicalgia: Secondary | ICD-10-CM | POA: Diagnosis not present

## 2017-04-18 ENCOUNTER — Ambulatory Visit
Admission: RE | Admit: 2017-04-18 | Discharge: 2017-04-18 | Disposition: A | Discharge status: Home / Self Care | Payer: BLUE CROSS/BLUE SHIELD | Source: Ambulatory Visit | Attending: Internal Medicine | Admitting: Internal Medicine

## 2017-04-18 DIAGNOSIS — Z13820 Encounter for screening for osteoporosis: Secondary | ICD-10-CM | POA: Diagnosis not present

## 2017-04-18 DIAGNOSIS — M85852 Other specified disorders of bone density and structure, left thigh: Secondary | ICD-10-CM | POA: Diagnosis not present

## 2017-04-18 DIAGNOSIS — M8588 Other specified disorders of bone density and structure, other site: Secondary | ICD-10-CM | POA: Insufficient documentation

## 2017-04-18 DIAGNOSIS — E2839 Other primary ovarian failure: Secondary | ICD-10-CM | POA: Diagnosis not present

## 2017-04-19 DIAGNOSIS — M542 Cervicalgia: Secondary | ICD-10-CM | POA: Diagnosis not present

## 2017-04-19 DIAGNOSIS — M436 Torticollis: Secondary | ICD-10-CM | POA: Diagnosis not present

## 2017-04-21 ENCOUNTER — Encounter: Payer: Self-pay | Admitting: Internal Medicine

## 2017-04-30 DIAGNOSIS — M542 Cervicalgia: Secondary | ICD-10-CM | POA: Diagnosis not present

## 2017-04-30 DIAGNOSIS — M436 Torticollis: Secondary | ICD-10-CM | POA: Diagnosis not present

## 2017-05-02 DIAGNOSIS — Z23 Encounter for immunization: Secondary | ICD-10-CM | POA: Diagnosis not present

## 2017-05-28 ENCOUNTER — Telehealth: Payer: Self-pay

## 2017-05-28 NOTE — Telephone Encounter (Signed)
Copied from [place] [phone] [phone]. Topic: Referral - Status >> May 28, 2017  2:26 PM Hewitt Shorts wrote: Reason for CRM: pt has questions about her bone density   Best number [phone]

## 2017-05-29 NOTE — Telephone Encounter (Signed)
Bone density was sent in under medical diagnosis instead of preventative. Can this be recoded to be preventative. Spoke with vanessa and she says that we will have to write a letter saying that it was miscoded and send to billing.

## 2017-05-29 NOTE — Telephone Encounter (Signed)
I do not call patients back routinely to discuss bone density results.  she can either use Mychart to relay her questions,  Or make an appointment

## 2017-06-03 ENCOUNTER — Telehealth: Payer: Self-pay | Admitting: Internal Medicine

## 2017-06-03 DIAGNOSIS — Z0000 Encounter for general adult medical examination without abnormal findings: Secondary | ICD-10-CM | POA: Insufficient documentation

## 2017-06-03 DIAGNOSIS — Z13820 Encounter for screening for osteoporosis: Secondary | ICD-10-CM

## 2017-06-03 NOTE — Telephone Encounter (Signed)
Letter requesting change in diagnosis code for DEXA scan written and signed .  In red folder

## 2017-06-03 NOTE — Assessment & Plan Note (Signed)
DExA ordered

## 2017-06-03 NOTE — Telephone Encounter (Signed)
Letter has been emailed to billing

## 2017-06-03 NOTE — Telephone Encounter (Signed)
Letter written and OV amended l.etter I blue folder

## 2017-06-03 NOTE — Telephone Encounter (Signed)
Letter has been sent to billing.

## 2017-06-20 ENCOUNTER — Other Ambulatory Visit: Payer: Self-pay | Admitting: Internal Medicine

## 2017-06-20 DIAGNOSIS — H524 Presbyopia: Secondary | ICD-10-CM | POA: Diagnosis not present

## 2017-06-20 DIAGNOSIS — Z1231 Encounter for screening mammogram for malignant neoplasm of breast: Secondary | ICD-10-CM

## 2017-06-28 ENCOUNTER — Encounter: Payer: Self-pay | Admitting: Internal Medicine

## 2017-07-25 DIAGNOSIS — L820 Inflamed seborrheic keratosis: Secondary | ICD-10-CM | POA: Diagnosis not present

## 2017-07-25 DIAGNOSIS — D229 Melanocytic nevi, unspecified: Secondary | ICD-10-CM | POA: Diagnosis not present

## 2017-07-25 DIAGNOSIS — L812 Freckles: Secondary | ICD-10-CM | POA: Diagnosis not present

## 2017-07-25 DIAGNOSIS — L570 Actinic keratosis: Secondary | ICD-10-CM | POA: Diagnosis not present

## 2017-07-25 DIAGNOSIS — L821 Other seborrheic keratosis: Secondary | ICD-10-CM | POA: Diagnosis not present

## 2017-07-25 DIAGNOSIS — L578 Other skin changes due to chronic exposure to nonionizing radiation: Secondary | ICD-10-CM | POA: Diagnosis not present

## 2017-08-06 ENCOUNTER — Ambulatory Visit: Payer: BLUE CROSS/BLUE SHIELD | Admitting: Internal Medicine

## 2017-08-07 ENCOUNTER — Encounter: Payer: Self-pay | Admitting: Internal Medicine

## 2017-08-07 ENCOUNTER — Ambulatory Visit
Admission: RE | Admit: 2017-08-07 | Discharge: 2017-08-07 | Disposition: A | Discharge status: Home / Self Care | Payer: BLUE CROSS/BLUE SHIELD | Source: Ambulatory Visit | Attending: Internal Medicine | Admitting: Internal Medicine

## 2017-08-07 ENCOUNTER — Ambulatory Visit: Payer: BLUE CROSS/BLUE SHIELD | Admitting: Internal Medicine

## 2017-08-07 VITALS — BP 136/70 | HR 63 | Temp 97.9°F | Resp 14 | Ht 65.0 in | Wt 138.4 lb

## 2017-08-07 DIAGNOSIS — R7303 Prediabetes: Secondary | ICD-10-CM | POA: Diagnosis not present

## 2017-08-07 DIAGNOSIS — L659 Nonscarring hair loss, unspecified: Secondary | ICD-10-CM | POA: Diagnosis not present

## 2017-08-07 DIAGNOSIS — Z789 Other specified health status: Secondary | ICD-10-CM | POA: Diagnosis not present

## 2017-08-07 DIAGNOSIS — Z1231 Encounter for screening mammogram for malignant neoplasm of breast: Secondary | ICD-10-CM | POA: Diagnosis not present

## 2017-08-07 DIAGNOSIS — E785 Hyperlipidemia, unspecified: Secondary | ICD-10-CM

## 2017-08-07 LAB — LIPID PANEL
CHOL/HDL RATIO: 3
Cholesterol: 129 mg/dL (ref 0–200)
HDL: 50.8 mg/dL (ref >39.00)
LDL Cholesterol: 64 mg/dL (ref 0–99)
NONHDL: 77.79
Triglycerides: 67 mg/dL (ref 0.0–149.0)
VLDL: 13.4 mg/dL (ref 0.0–40.0)

## 2017-08-07 LAB — COMPREHENSIVE METABOLIC PANEL
ALT: 20 U/L (ref 0–35)
AST: 20 U/L (ref 0–37)
Albumin: 4.6 g/dL (ref 3.5–5.2)
Alkaline Phosphatase: 58 U/L (ref 39–117)
BUN: 19 mg/dL (ref 6–23)
CO2: 29 meq/L (ref 19–32)
Calcium: 9.4 mg/dL (ref 8.4–10.5)
Chloride: 105 mEq/L (ref 96–112)
Creatinine, Ser: 0.76 mg/dL (ref 0.40–1.20)
GFR: 81.51 mL/min (ref >60.00)
GLUCOSE: 107 mg/dL — AB (ref 70–99)
POTASSIUM: 4.6 meq/L (ref 3.5–5.1)
Sodium: 141 mEq/L (ref 135–145)
Total Bilirubin: 0.6 mg/dL (ref 0.2–1.2)
Total Protein: 7.1 g/dL (ref 6.0–8.3)

## 2017-08-07 LAB — TSH: TSH: 3.59 u[IU]/mL (ref 0.35–4.50)

## 2017-08-07 LAB — HEMOGLOBIN A1C: HEMOGLOBIN A1C: 5.7 % (ref 4.6–6.5)

## 2017-08-07 MED ORDER — LEVOFLOXACIN 500 MG PO TABS: 500.0000 mg | ORAL_TABLET | Freq: Every day | ORAL | 0 refills | Status: DC

## 2017-08-07 MED ORDER — OSELTAMIVIR PHOSPHATE 75 MG PO CAPS: 75.0000 mg | ORAL_CAPSULE | Freq: Every day | ORAL | 0 refills | Status: DC

## 2017-08-07 MED ORDER — RIZATRIPTAN BENZOATE 10 MG PO TABS: ORAL_TABLET | ORAL | 2 refills | Status: DC

## 2017-08-07 MED ORDER — MECLIZINE HCL 25 MG PO TABS
25.0000 mg | ORAL_TABLET | Freq: Three times a day (TID) | ORAL | 0 refills | Status: DC | PRN
Start: 2017-08-07 — End: 2019-01-16

## 2017-08-07 MED ORDER — ROSUVASTATIN CALCIUM 20 MG PO TABS: 20.0000 mg | ORAL_TABLET | ORAL | 1 refills | Status: DC

## 2017-08-07 MED ORDER — ONDANSETRON 4 MG PO TBDP: 4.0000 mg | ORAL_TABLET | Freq: Three times a day (TID) | ORAL | 0 refills | Status: DC | PRN

## 2017-08-07 MED ORDER — TRAMADOL HCL 50 MG PO TABS: 50.0000 mg | ORAL_TABLET | Freq: Four times a day (QID) | ORAL | 0 refills | Status: DC | PRN

## 2017-08-07 NOTE — Patient Instructions (Signed)
AFTER YOU FLY:   You should use [employer] Sinus rinse ;  It is a stong sinus "flush" using water and medicated salts.  Do it over the sink because it can be a bit messy.  To prevent illness caused bu being in close contact with other passengers   Levaquin, tamiflu,  meclinizne and zofran sent [facility] to take on your trip  Given your history of statin intolerance, I would like to  Try to get you the injectible medication that has become available through pior authorization called "repatha." this medication is self administered as a subCutaneous injection either on a monthly basis or on a biweekly basis. It is a monclonal antibody so it works very differently than statins, and lowers cholesterol very rapidly (by the end of the first week . I will admit I have not prescribed the medication yet to anyone else, But the cardiologists have. The side effect profile is pretty mild (itching And runny nose are the most common, both are reported in In less than ten percent ).

## 2017-08-07 NOTE — Progress Notes (Signed)
Subjective:  Patient ID: Brianna [place], female    DOB: Nov 06, 1955  Age: 62 y.o. MRN: [id number]  CC: The primary encounter diagnosis was Hyperlipidemia LDL goal <130. Diagnoses of Prediabetes, Alopecia, and Statin intolerance were also pertinent to this visit.  HPI Brianna [place]houn presents for  6 month follow up on hyperlipidemia .  She was referred to cardiology for risk stratification prior to resuming statin therapy.  She was advised that regardless of a potentially excellent coronary calcium score,  Statin therapy would still be advised,  So she has been advised to resume crestor.   medical history reviewed.  She continues to be concerned about experiencing statin intolerance due to prior trials reports having had muscle aches,   Would like to consider alternative therapy with Repatha if it can be approved.    Follow up on DEXA scan: mild osteopenia  Of right femur T score -1.6  Noted. Discussed the current controversies surrounding the risks and benefits of calcium supplementation.  Encouraged her to increase dietary calcium through natural foods including soy,  almond/coconut milk   Lab Results  Component Value Date   LDLCALC 64 08/07/2017   Lab Results  Component Value Date   HGBA1C 5.7 08/07/2017       Outpatient Medications Prior to Visit  Medication Sig Dispense Refill  . ascorbic acid (VITAMIN C) 250 MG tablet Take 250 mg by mouth daily.    Marland Kitchen aspirin EC 81 MG tablet Take 81 mg by mouth 2 (two) times daily.    . butalbital-aspirin-caffeine (FIORINAL) 50-325-40 MG per capsule Take 1-2 capsules by mouth every 4 (four) hours as needed for migraine.     . cholecalciferol (VITAMIN D) 1000 units tablet Take 1,000 Units by mouth daily.    Marland Kitchen co-enzyme Q-10 30 MG capsule Take 100 mg by mouth daily.    . cycloSPORINE (RESTASIS) 0.05 % ophthalmic emulsion Place 1 drop into both eyes 2 (two) times daily.    . meloxicam (MOBIC) 15 MG tablet   2  . methocarbamol (ROBAXIN) 500 MG  tablet   2  . naproxen sodium (ANAPROX) 220 MG tablet Take 440 mg by mouth 2 (two) times daily as needed (headache).    . Olopatadine HCl 0.2 % SOLN Place 1 drop into both eyes daily as needed.    . Probiotic Product (PROBIOTIC ADVANCED PO) Take by mouth at bedtime.    . vitamin B-12 (CYANOCOBALAMIN) 1000 MCG tablet Take 1,000 mcg by mouth daily.    . rizatriptan (MAXALT) 10 MG tablet rizatriptan 10 mg tablet    . traMADol (ULTRAM) 50 MG tablet   0  . rosuvastatin (CRESTOR) 20 MG tablet Take 1 tablet (20 mg total) by mouth every other day. 30 tablet 1   No facility-administered medications prior to visit.     Review of Systems;  Patient denies headache, fevers, malaise, unintentional weight loss, skin rash, eye pain, sinus congestion and sinus pain, sore throat, dysphagia,  hemoptysis , cough, dyspnea, wheezing, chest pain, palpitations, orthopnea, edema, abdominal pain, nausea, melena, diarrhea, constipation, flank pain, dysuria, hematuria, urinary  Frequency, nocturia, numbness, tingling, seizures,  Focal weakness, Loss of consciousness,  Tremor, insomnia, depression, anxiety, and suicidal ideation.      Objective:  BP 136/70 (BP Location: Left Arm, Patient Position: Sitting, Cuff Size: Normal)   Pulse 63   Temp 97.9 F (36.6 C) (Oral)   Resp 14   Ht 5\' 5"  (1.651 m)   Wt 138 lb 6.4  oz (62.8 kg)   SpO2 98%   BMI 23.03 kg/m   BP Readings from Last 3 Encounters:  08/07/17 136/70  03/01/17 126/78  11/01/16 120/82    Wt Readings from Last 3 Encounters:  08/07/17 138 lb 6.4 oz (62.8 kg)  03/01/17 139 lb 12.8 oz (63.4 kg)  11/01/16 138 lb 8 oz (62.8 kg)    General appearance: alert, cooperative and appears stated age Ears: normal TM's and external ear canals both ears Throat: lips, mucosa, and tongue normal; teeth and gums normal Neck: no adenopathy, no carotid bruit, supple, symmetrical, trachea midline and thyroid not enlarged, symmetric, no tenderness/mass/nodules Back:  symmetric, no curvature. ROM normal. No CVA tenderness. Lungs: clear to auscultation bilaterally Heart: regular rate and rhythm, S1, S2 normal, no murmur, click, rub or gallop Abdomen: soft, non-tender; bowel sounds normal; no masses,  no organomegaly Pulses: 2+ and symmetric Skin: Skin color, texture, turgor normal. No rashes or lesions Lymph nodes: Cervical, supraclavicular, and axillary nodes normal.  Lab Results  Component Value Date   HGBA1C 5.7 08/07/2017   HGBA1C 5.5 10/16/2016    Lab Results  Component Value Date   CREATININE 0.76 08/07/2017   CREATININE 0.82 03/01/2017   CREATININE 0.80 10/16/2016    Lab Results  Component Value Date   WBC 4.4 10/16/2016   HGB 15.3 (H) 10/16/2016   HCT 45.6 10/16/2016   PLT 269.0 10/16/2016   GLUCOSE 107 (H) 08/07/2017   CHOL 129 08/07/2017   TRIG 67.0 08/07/2017   HDL 50.80 08/07/2017   LDLCALC 64 08/07/2017   ALT 20 08/07/2017   AST 20 08/07/2017   NA 141 08/07/2017   K 4.6 08/07/2017   CL 105 08/07/2017   CREATININE 0.76 08/07/2017   BUN 19 08/07/2017   CO2 29 08/07/2017   TSH 3.59 08/07/2017   HGBA1C 5.7 08/07/2017    Dg Bone Density  Result Date: 04/18/2017 EXAM: DUAL X-RAY ABSORPTIOMETRY (DXA) FOR BONE MINERAL DENSITY IMPRESSION: Dear Dr. Derrel Nip, Your patient Brianna Calhoun completed a BMD test on 04/18/2017 using the [place] (analysis version: 14.10) manufactured by [employer]. The following summarizes the results of our evaluation. PATIENT BIOGRAPHICAL: Name: Brianna, Calhoun Patient ID: [id number] Birth Date: 10/01/1955 Height: 65.0 in. Gender: Female Exam Date: 04/18/2017 Weight: 141.0 lbs. Indications: Caucasian, Postmenopausal Fractures: Treatments: Multi-Vitamin, Vitamin D ASSESSMENT: The BMD measured at Femur Neck Right is 0.815 g/cm2 with a T-score of -1.6. This patient is considered osteopenic according to [facility]) criteria. L-4 was excluded due to degenerative changes. Site  Region Measured Measured WHO Young Adult BMD Date       Age      Classification T-score AP Spine L1-L3 04/18/2017 63.4 Normal -0.2 1.163 g/cm2 DualFemur Neck Right 04/18/2017 63.4 Osteopenia -1.6 0.815 g/cm2 World Health Organization [facility]) criteria for post-menopausal, Caucasian Women: Normal:       T-score at or above -1 SD Osteopenia:   T-score between -1 and -2.5 SD Osteoporosis: T-score at or below -2.5 SD RECOMMENDATIONS: [place] recommends that FDA-approved medical therapies be considered in postmenopausal women and men age 74 or older with a: 1. Hip or vertebral (clinical or morphometric) fracture. 2. T-score of < -2.5 at the spine or hip. 3. Ten-year fracture probability by FRAX of 3% or greater for hip fracture or 20% or greater for major osteoporotic fracture. All treatment decisions require clinical judgment and consideration of individual patient factors, including patient preferences, co-morbidities, previous drug use, risk factors  not captured in the FRAX model (e.g. falls, vitamin D deficiency, increased bone turnover, interval significant decline in bone density) and possible under - or over-estimation of fracture risk by FRAX. All patients should ensure an adequate intake of dietary calcium (1200 mg/d) and vitamin D (800 IU daily) unless contraindicated. FOLLOW-UP: People with diagnosed cases of osteoporosis or at high risk for fracture should have regular bone mineral density tests. For patients eligible for Medicare, routine testing is allowed once every 2 years. The testing frequency can be increased to one year for patients who have rapidly progressing disease, those who are receiving or discontinuing medical therapy to restore bone mass, or have additional risk factors. I have reviewed this report, and agree with the above findings. [facility] Dear Dr. Derrel Nip, Your patient Brianna Calhoun completed a FRAX assessment on 04/18/2017 using the [place]  (analysis version: 14.10) manufactured by [employer]. The following summarizes the results of our evaluation. PATIENT BIOGRAPHICAL: Name: Trinnity, Breunig Patient ID: [id number] Birth Date: February 12, 1956 Height:    65.0 in. Gender:     Female    [age]       Weight:    141.0 lbs. Ethnicity:  White                            Exam Date: 04/18/2017 FRAX* RESULTS:  (version: 3.5) 10-year Probability of Fracture1 Major Osteoporotic Fracture2 Hip Fracture 8.8% 1.0% Population: [place] (Caucasian) Risk Factors: None Based on Femur (Right) Neck BMD 1 -The 10-year probability of fracture may be lower than reported if the patient has received treatment. 2 -Major Osteoporotic Fracture: Clinical Spine, Forearm, Hip or Shoulder *FRAX is a Materials engineer of the [employer] for Metabolic Bone Disease, a [place] (WHO) [facility]. ASSESSMENT: The probability of a major osteoporotic fracture is 8.8% within the next ten years. The probability of a hip fracture is 1.0% within the next ten years. . Electronically Signed   By: Marin Olp M.D.   On: 04/18/2017 14:15    Assessment & Plan:   Problem List Items Addressed This Visit    Hyperlipidemia LDL goal <130 - Primary    LDL is at goal , but she has persistent muscle pain on rosuvastatin and is requesting trial of Repatha. PA in progress  Lab Results  Component Value Date   CHOL 129 08/07/2017   HDL 50.80 08/07/2017   LDLCALC 64 08/07/2017   TRIG 67.0 08/07/2017   CHOLHDL 3 08/07/2017          Relevant Medications   rosuvastatin (CRESTOR) 20 MG tablet   Other Relevant Orders   Lipid panel (Completed)   Prediabetes    She has a history of an A1c in the past of 5.7 or 5.8,  But it was 5.5 in [place]  Fasting glucose is normal. continue periodic surveillance   Lab Results  Component Value Date   HGBA1C 5.7 08/07/2017         Relevant Orders   Hemoglobin A1c (Completed)   Statin intolerance     Multiple trials of statins have resulted in myalgias.  PA for Repatha in progress.        Other Visit Diagnoses    Alopecia       Relevant Orders   Comprehensive metabolic panel (Completed)   TSH (Completed)      I have  changed Sarafina L. Garrod's traMADol. I am also having her start on meclizine, levofloxacin, oseltamivir, and ondansetron. Additionally, I am having her maintain her aspirin EC, butalbital-aspirin-caffeine, cycloSPORINE, Olopatadine HCl, naproxen sodium, ascorbic acid, vitamin B-12, co-enzyme Q-10, Probiotic Product (PROBIOTIC ADVANCED PO), cholecalciferol, meloxicam, methocarbamol, rosuvastatin, and rizatriptan.  Meds ordered this encounter  Medications  . rosuvastatin (CRESTOR) 20 MG tablet    Sig: Take 1 tablet (20 mg total) by mouth every other day.    Dispense:  90 tablet    Refill:  1    Additional refills will then be done by PCP  . meclizine (ANTIVERT) 25 MG tablet    Sig: Take 1 tablet (25 mg total) by mouth 3 (three) times daily as needed for dizziness.    Dispense:  30 tablet    Refill:  0  . levofloxacin (LEVAQUIN) 500 MG tablet    Sig: Take 1 tablet (500 mg total) by mouth daily.    Dispense:  7 tablet    Refill:  0  . oseltamivir (TAMIFLU) 75 MG capsule    Sig: Take 1 capsule (75 mg total) by mouth daily.    Dispense:  10 capsule    Refill:  0  . ondansetron (ZOFRAN ODT) 4 MG disintegrating tablet    Sig: Take 1 tablet (4 mg total) by mouth every 8 (eight) hours as needed for nausea or vomiting.    Dispense:  20 tablet    Refill:  0  . traMADol (ULTRAM) 50 MG tablet    Sig: Take 1 tablet (50 mg total) by mouth every 6 (six) hours as needed.    Dispense:  30 tablet    Refill:  0    KEEP ON FILE FOR FUTURE REFILLS  . rizatriptan (MAXALT) 10 MG tablet    Sig: rizatriptan 10 mg tablet    Dispense:  10 tablet    Refill:  2  A total of 25 minutes of face to face time was spent with patient more than half of which was spent in counselling about the  above mentioned conditions  and coordination of care   Medications Discontinued During This Encounter  Medication Reason  . rosuvastatin (CRESTOR) 20 MG tablet Reorder  . traMADol (ULTRAM) 50 MG tablet Reorder  . rizatriptan (MAXALT) 10 MG tablet Reorder    Follow-up: Return in about 6 months (around 02/04/2018).   Crecencio Mc, MD

## 2017-08-08 ENCOUNTER — Encounter: Payer: Self-pay | Admitting: Internal Medicine

## 2017-08-08 DIAGNOSIS — Z789 Other specified health status: Secondary | ICD-10-CM | POA: Insufficient documentation

## 2017-08-08 NOTE — Assessment & Plan Note (Signed)
Multiple trials of statins have resulted in myalgias.  PA for Repatha in progress.

## 2017-08-08 NOTE — Assessment & Plan Note (Signed)
She has a history of an A1c in the past of 5.7 or 5.8,  But it was 5.5 in April  Fasting glucose is normal. continue periodic surveillance   Lab Results  Component Value Date   HGBA1C 5.7 08/07/2017

## 2017-08-08 NOTE — Assessment & Plan Note (Addendum)
LDL is at goal , but she has persistent muscle pain on rosuvastatin and is requesting trial of Repatha. PA in progress  Lab Results  Component Value Date   CHOL 129 08/07/2017   HDL 50.80 08/07/2017   LDLCALC 64 08/07/2017   TRIG 67.0 08/07/2017   CHOLHDL 3 08/07/2017

## 2017-08-12 MED ORDER — EVOLOCUMAB 140 MG/ML ~~LOC~~ SOAJ: 1.0000 "application " | SUBCUTANEOUS | 1 refills | Status: DC

## 2017-08-12 NOTE — Addendum Note (Signed)
Addended by: Nanci Pina on: 08/12/2017 02:44 PM   Modules accepted: Orders

## 2017-08-12 NOTE — Progress Notes (Signed)
Pa Started for Repatha on cover my meds. If approved must be ordered [facility] [phone]; [id number]

## 2017-08-13 ENCOUNTER — Telehealth: Payer: Self-pay

## 2017-08-13 NOTE — Telephone Encounter (Signed)
PA for Repatha has been submitted on covermymeds.  

## 2017-08-15 NOTE — Telephone Encounter (Signed)
My Chart message sent

## 2017-08-15 NOTE — Addendum Note (Signed)
Addended by: Crecencio Mc on: 08/15/2017 02:25 PM   Modules accepted: Orders

## 2017-08-15 NOTE — Telephone Encounter (Signed)
Repatha has been denied by insurance due to pt not having a history of cardiovascular disease and no comorbidity.

## 2017-08-22 ENCOUNTER — Other Ambulatory Visit: Payer: Self-pay | Admitting: Internal Medicine

## 2017-08-22 ENCOUNTER — Encounter: Payer: Self-pay | Admitting: Internal Medicine

## 2017-08-22 DIAGNOSIS — E785 Hyperlipidemia, unspecified: Secondary | ICD-10-CM

## 2017-08-22 DIAGNOSIS — R7303 Prediabetes: Secondary | ICD-10-CM

## 2017-08-23 ENCOUNTER — Encounter: Payer: Self-pay | Admitting: Internal Medicine

## 2017-09-04 ENCOUNTER — Telehealth: Payer: Self-pay

## 2017-09-04 NOTE — Telephone Encounter (Signed)
Sam from covermymeds is calling to find out if we have received this patients appeal for Repatha. It was sent on 08/29/17 and 09/02/2017   CB: [id number] Ref key [id number] Does require a letter of consent to be sent to the plan with the appeal forms

## 2017-09-06 NOTE — Telephone Encounter (Signed)
Dr. Derrel Nip has discontinued this medication and has advised the pt to take crestor every other day since pt does not meet qualifications for the repatha.

## 2017-11-21 ENCOUNTER — Other Ambulatory Visit: Payer: BLUE CROSS/BLUE SHIELD

## 2017-11-27 DIAGNOSIS — D1800 Hemangioma unspecified site: Secondary | ICD-10-CM | POA: Diagnosis not present

## 2017-11-27 DIAGNOSIS — L820 Inflamed seborrheic keratosis: Secondary | ICD-10-CM | POA: Diagnosis not present

## 2017-11-27 DIAGNOSIS — Z1283 Encounter for screening for malignant neoplasm of skin: Secondary | ICD-10-CM | POA: Diagnosis not present

## 2017-11-27 DIAGNOSIS — L570 Actinic keratosis: Secondary | ICD-10-CM | POA: Diagnosis not present

## 2017-11-27 DIAGNOSIS — L719 Rosacea, unspecified: Secondary | ICD-10-CM | POA: Diagnosis not present

## 2017-11-27 DIAGNOSIS — Z85828 Personal history of other malignant neoplasm of skin: Secondary | ICD-10-CM | POA: Diagnosis not present

## 2017-12-04 ENCOUNTER — Other Ambulatory Visit (INDEPENDENT_AMBULATORY_CARE_PROVIDER_SITE_OTHER): Payer: BLUE CROSS/BLUE SHIELD

## 2017-12-04 DIAGNOSIS — E785 Hyperlipidemia, unspecified: Secondary | ICD-10-CM

## 2017-12-04 DIAGNOSIS — R7303 Prediabetes: Secondary | ICD-10-CM | POA: Diagnosis not present

## 2017-12-04 LAB — COMPREHENSIVE METABOLIC PANEL
ALK PHOS: 63 U/L (ref 39–117)
ALT: 17 U/L (ref 0–35)
AST: 18 U/L (ref 0–37)
Albumin: 4.3 g/dL (ref 3.5–5.2)
BILIRUBIN TOTAL: 0.6 mg/dL (ref 0.2–1.2)
BUN: 18 mg/dL (ref 6–23)
CO2: 29 mEq/L (ref 19–32)
CREATININE: 0.76 mg/dL (ref 0.40–1.20)
Calcium: 9.3 mg/dL (ref 8.4–10.5)
Chloride: 102 mEq/L (ref 96–112)
GFR: 81.42 mL/min (ref >60.00)
GLUCOSE: 101 mg/dL — AB (ref 70–99)
Potassium: 4.4 mEq/L (ref 3.5–5.1)
Sodium: 138 mEq/L (ref 135–145)
TOTAL PROTEIN: 7 g/dL (ref 6.0–8.3)

## 2017-12-04 LAB — HEMOGLOBIN A1C: HEMOGLOBIN A1C: 5.5 % (ref 4.6–6.5)

## 2017-12-04 LAB — LIPID PANEL
Cholesterol: 248 mg/dL — ABNORMAL HIGH (ref 0–200)
HDL: 54.2 mg/dL (ref >39.00)
LDL Cholesterol: 164 mg/dL — ABNORMAL HIGH (ref 0–99)
NONHDL: 193.84
Total CHOL/HDL Ratio: 5
Triglycerides: 148 mg/dL (ref 0.0–149.0)
VLDL: 29.6 mg/dL (ref 0.0–40.0)

## 2018-01-28 ENCOUNTER — Encounter: Payer: Self-pay | Admitting: Internal Medicine

## 2018-02-05 ENCOUNTER — Encounter: Payer: Self-pay | Admitting: Internal Medicine

## 2018-02-05 ENCOUNTER — Ambulatory Visit: Payer: BLUE CROSS/BLUE SHIELD | Admitting: Internal Medicine

## 2018-02-05 DIAGNOSIS — F411 Generalized anxiety disorder: Secondary | ICD-10-CM | POA: Diagnosis not present

## 2018-02-05 DIAGNOSIS — R7303 Prediabetes: Secondary | ICD-10-CM | POA: Diagnosis not present

## 2018-02-05 DIAGNOSIS — E785 Hyperlipidemia, unspecified: Secondary | ICD-10-CM | POA: Diagnosis not present

## 2018-02-05 MED ORDER — TRAMADOL HCL 50 MG PO TABS: 50.0000 mg | ORAL_TABLET | Freq: Four times a day (QID) | ORAL | 0 refills | Status: DC | PRN

## 2018-02-05 MED ORDER — DOXYCYCLINE HYCLATE 100 MG PO TABS: 100.0000 mg | ORAL_TABLET | Freq: Two times a day (BID) | ORAL | 0 refills | Status: DC

## 2018-02-05 NOTE — Assessment & Plan Note (Signed)
10 yr risk is 8.5% untreated.  Has statin intolerance,  Repatha denied by insurance .  No treatment needed  Lab Results  Component Value Date   CHOL 248 (H) 12/04/2017   HDL 54.20 12/04/2017   LDLCALC 164 (H) 12/04/2017   TRIG 148.0 12/04/2017   CHOLHDL 5 12/04/2017

## 2018-02-05 NOTE — Assessment & Plan Note (Signed)
She has a history of an A1c in the past of 5.7 or 5.8,  But it was 5.5 in April  Fasting glucose is normal. continue periodic surveillance   Lab Results  Component Value Date   HGBA1C 5.5 12/04/2017

## 2018-02-05 NOTE — Patient Instructions (Signed)
Good to see you!  I'll see you in 6 months for  A PAP smear   Save the doxy for  Fever and headache in the setting of tick exposure.   Take a probiotic for 3 weeks if you start it

## 2018-02-05 NOTE — Assessment & Plan Note (Addendum)
Reassured that she has no active health issues.  Current stressors are due to family events. A total of 25 minutes of face to face time was spent with patient more than half of which was spent in counselling about the above mentioned conditions  and coordination of care No treatment advised

## 2018-02-05 NOTE — Progress Notes (Signed)
Subjective:  Patient ID: Brianna [place], female    DOB: Jun 01, 1956  Age: 62 y.o. MRN: [id number]  CC: Diagnoses of Hyperlipidemia LDL goal <130, Prediabetes, and Anxiety state were pertinent to this visit.  HPI DARICA GOREN presents for follow up on hyperlipidemia, MIGRAINE disorder treated with fiorinal/tramadol and prediabetes  Did not tolerate Crestor trial dure to recurrent muscle pain  and brain fog.  Untreated cholesterol repeated in late May and 10 yr risk of CAD is now 8.5% .  She exercises regularly , follows a careful diet   and has no history fo CAD,  DM or tobacco abuse or HTN   Migraine:  She is using  robaxin and aleve as first attempt to thwart,  Adds half tramadol if still in pain,. Occurs   2 / month .  Anxiety:  Intense period right now due to multiple family events she is planning including her daughter's wedding . Feels irritable,  Sleeping well.    Lab Results  Component Value Date   CHOL 248 (H) 12/04/2017   HDL 54.20 12/04/2017   LDLCALC 164 (H) 12/04/2017   TRIG 148.0 12/04/2017   CHOLHDL 5 12/04/2017    Outpatient Medications Prior to Visit  Medication Sig Dispense Refill  . cholecalciferol (VITAMIN D) 1000 units tablet Take 1,000 Units by mouth daily.    . cycloSPORINE (RESTASIS) 0.05 % ophthalmic emulsion Place 1 drop into both eyes 2 (two) times daily.    . meclizine (ANTIVERT) 25 MG tablet Take 1 tablet (25 mg total) by mouth 3 (three) times daily as needed for dizziness. 30 tablet 0  . methocarbamol (ROBAXIN) 500 MG tablet   2  . naproxen sodium (ANAPROX) 220 MG tablet Take 440 mg by mouth 2 (two) times daily as needed (headache).    . Olopatadine HCl 0.2 % SOLN Place 1 drop into both eyes daily as needed.    . ondansetron (ZOFRAN ODT) 4 MG disintegrating tablet Take 1 tablet (4 mg total) by mouth every 8 (eight) hours as needed for nausea or vomiting. 20 tablet 0  . butalbital-aspirin-caffeine (FIORINAL) 50-325-40 MG per capsule Take 1-2 capsules by  mouth every 4 (four) hours as needed for migraine.     . rizatriptan (MAXALT) 10 MG tablet rizatriptan 10 mg tablet 10 tablet 2  . traMADol (ULTRAM) 50 MG tablet Take 1 tablet (50 mg total) by mouth every 6 (six) hours as needed. 30 tablet 0  . ascorbic acid (VITAMIN C) 250 MG tablet Take 250 mg by mouth daily.    Marland Kitchen aspirin EC 81 MG tablet Take 81 mg by mouth 2 (two) times daily.    Marland Kitchen co-enzyme Q-10 30 MG capsule Take 100 mg by mouth daily.    . meloxicam (MOBIC) 15 MG tablet   2  . oseltamivir (TAMIFLU) 75 MG capsule Take 1 capsule (75 mg total) by mouth daily. (Patient not taking: Reported on 02/05/2018) 10 capsule 0  . Probiotic Product (PROBIOTIC ADVANCED PO) Take by mouth at bedtime.    . vitamin B-12 (CYANOCOBALAMIN) 1000 MCG tablet Take 1,000 mcg by mouth daily.     No facility-administered medications prior to visit.     Review of Systems;  Patient denies headache, fevers, malaise, unintentional weight loss, skin rash, eye pain, sinus congestion and sinus pain, sore throat, dysphagia,  hemoptysis , cough, dyspnea, wheezing, chest pain, palpitations, orthopnea, edema, abdominal pain, nausea, melena, diarrhea, constipation, flank pain, dysuria, hematuria, urinary  Frequency, nocturia, numbness, tingling, seizures,  Focal weakness, Loss of consciousness,  Tremor, insomnia, depression, anxiety, and suicidal ideation.      Objective:  BP (!) 144/88 (BP Location: Left Arm, Patient Position: Sitting, Cuff Size: Normal)   Pulse 66   Temp 97.7 F (36.5 C) (Oral)   Resp 14   Ht 5\' 5"  (1.651 m)   Wt 140 lb (63.5 kg)   SpO2 97%   BMI 23.30 kg/m   BP Readings from Last 3 Encounters:  02/05/18 (!) 144/88  08/07/17 136/70  03/01/17 126/78    Wt Readings from Last 3 Encounters:  02/05/18 140 lb (63.5 kg)  08/07/17 138 lb 6.4 oz (62.8 kg)  03/01/17 139 lb 12.8 oz (63.4 kg)    General appearance: alert, cooperative and appears stated age Ears: normal TM's and external ear canals  both ears Throat: lips, mucosa, and tongue normal; teeth and gums normal Neck: no adenopathy, no carotid bruit, supple, symmetrical, trachea midline and thyroid not enlarged, symmetric, no tenderness/mass/nodules Back: symmetric, no curvature. ROM normal. No CVA tenderness. Lungs: clear to auscultation bilaterally Heart: regular rate and rhythm, S1, S2 normal, no murmur, click, rub or gallop Abdomen: soft, non-tender; bowel sounds normal; no masses,  no organomegaly Pulses: 2+ and symmetric Skin: Skin color, texture, turgor normal. No rashes or lesions Lymph nodes: Cervical, supraclavicular, and axillary nodes normal.  Lab Results  Component Value Date   HGBA1C 5.5 12/04/2017   HGBA1C 5.7 08/07/2017   HGBA1C 5.5 10/16/2016    Lab Results  Component Value Date   CREATININE 0.76 12/04/2017   CREATININE 0.76 08/07/2017   CREATININE 0.82 03/01/2017    Lab Results  Component Value Date   WBC 4.4 10/16/2016   HGB 15.3 (H) 10/16/2016   HCT 45.6 10/16/2016   PLT 269.0 10/16/2016   GLUCOSE 101 (H) 12/04/2017   CHOL 248 (H) 12/04/2017   TRIG 148.0 12/04/2017   HDL 54.20 12/04/2017   LDLCALC 164 (H) 12/04/2017   ALT 17 12/04/2017   AST 18 12/04/2017   NA 138 12/04/2017   K 4.4 12/04/2017   CL 102 12/04/2017   CREATININE 0.76 12/04/2017   BUN 18 12/04/2017   CO2 29 12/04/2017   TSH 3.59 08/07/2017   HGBA1C 5.5 12/04/2017    Mm Screening Breast Tomo Bilateral  Result Date: 08/07/2017 CLINICAL DATA:  Screening. EXAM: 2D DIGITAL SCREENING BILATERAL MAMMOGRAM WITH 3D TOMO WITH CAD COMPARISON:  Previous exam(s). ACR Breast Density Category b: There are scattered areas of fibroglandular density. FINDINGS: There are no findings suspicious for malignancy. Images were processed with CAD. IMPRESSION: No mammographic evidence of malignancy. A result letter of this screening mammogram will be mailed directly to the patient. RECOMMENDATION: Screening mammogram in one year. (Code:SM-B-01Y)  BI-RADS CATEGORY  1: Negative. Electronically Signed   By: Fidela Salisbury M.D.   On: 08/07/2017 12:18    Assessment & Plan:   Problem List Items Addressed This Visit    Prediabetes    She has a history of an A1c in the past of 5.7 or 5.8,  But it was 5.5 in [place]  Fasting glucose is normal. continue periodic surveillance   Lab Results  Component Value Date   HGBA1C 5.5 12/04/2017         Hyperlipidemia LDL goal <130    10 yr risk is 8.5% untreated.  Has statin intolerance,  Repatha denied by insurance .  No treatment needed  Lab Results  Component Value Date   CHOL 248 (H) 12/04/2017  HDL 54.20 12/04/2017   LDLCALC 164 (H) 12/04/2017   TRIG 148.0 12/04/2017   CHOLHDL 5 12/04/2017          Anxiety state    Reassured that she has no active health issues.  Current stressors are due to family events. A total of 25 minutes of face to face time was spent with patient more than half of which was spent in counselling about the above mentioned conditions  and coordination of care No treatment advised          I have discontinued Sharren L. Cizek's aspirin EC, butalbital-aspirin-caffeine, ascorbic acid, vitamin B-12, co-enzyme Q-10, Probiotic Product (PROBIOTIC ADVANCED PO), meloxicam, oseltamivir, and rizatriptan. I am also having her start on doxycycline. Additionally, I am having her maintain her cycloSPORINE, Olopatadine HCl, naproxen sodium, cholecalciferol, methocarbamol, meclizine, ondansetron, and traMADol.  Meds ordered this encounter  Medications  . traMADol (ULTRAM) 50 MG tablet    Sig: Take 1 tablet (50 mg total) by mouth every 6 (six) hours as needed.    Dispense:  30 tablet    Refill:  0    KEEP ON FILE FOR FUTURE REFILLS  . doxycycline (VIBRA-TABS) 100 MG tablet    Sig: Take 1 tablet (100 mg total) by mouth 2 (two) times daily.    Dispense:  20 tablet    Refill:  0    Medications Discontinued During This Encounter  Medication Reason  . ascorbic acid  (VITAMIN C) 250 MG tablet Patient has not taken in last 30 days  . aspirin EC 81 MG tablet Patient has not taken in last 30 days  . co-enzyme Q-10 30 MG capsule Patient has not taken in last 30 days  . meloxicam (MOBIC) 15 MG tablet Patient has not taken in last 30 days  . oseltamivir (TAMIFLU) 75 MG capsule Patient has not taken in last 30 days  . Probiotic Product (PROBIOTIC ADVANCED PO) Patient has not taken in last 30 days  . vitamin B-12 (CYANOCOBALAMIN) 1000 MCG tablet Patient has not taken in last 30 days  . butalbital-aspirin-caffeine (FIORINAL) 50-325-40 MG per capsule   . rizatriptan (MAXALT) 10 MG tablet   . traMADol (ULTRAM) 50 MG tablet Reorder    Follow-up: Return in about 6 months (around 08/08/2018) for CPE PAP .   Crecencio Mc, MD

## 2018-02-07 DIAGNOSIS — H43812 Vitreous degeneration, left eye: Secondary | ICD-10-CM | POA: Diagnosis not present

## 2018-05-16 DIAGNOSIS — Z23 Encounter for immunization: Secondary | ICD-10-CM | POA: Diagnosis not present

## 2018-06-02 DIAGNOSIS — L814 Other melanin hyperpigmentation: Secondary | ICD-10-CM | POA: Diagnosis not present

## 2018-06-02 DIAGNOSIS — Z85828 Personal history of other malignant neoplasm of skin: Secondary | ICD-10-CM | POA: Diagnosis not present

## 2018-06-02 DIAGNOSIS — L821 Other seborrheic keratosis: Secondary | ICD-10-CM | POA: Diagnosis not present

## 2018-06-02 DIAGNOSIS — L578 Other skin changes due to chronic exposure to nonionizing radiation: Secondary | ICD-10-CM | POA: Diagnosis not present

## 2018-06-02 DIAGNOSIS — L570 Actinic keratosis: Secondary | ICD-10-CM | POA: Diagnosis not present

## 2018-06-02 DIAGNOSIS — L820 Inflamed seborrheic keratosis: Secondary | ICD-10-CM | POA: Diagnosis not present

## 2018-06-25 ENCOUNTER — Other Ambulatory Visit: Payer: Self-pay | Admitting: Internal Medicine

## 2018-06-25 DIAGNOSIS — Z1231 Encounter for screening mammogram for malignant neoplasm of breast: Secondary | ICD-10-CM

## 2018-06-26 DIAGNOSIS — H524 Presbyopia: Secondary | ICD-10-CM | POA: Diagnosis not present

## 2018-07-24 ENCOUNTER — Other Ambulatory Visit (HOSPITAL_COMMUNITY)
Admission: RE | Admit: 2018-07-24 | Discharge: 2018-07-24 | Disposition: A | Discharge status: Home / Self Care | Payer: BLUE CROSS/BLUE SHIELD | Source: Ambulatory Visit | Attending: Internal Medicine | Admitting: Internal Medicine

## 2018-07-24 ENCOUNTER — Ambulatory Visit (INDEPENDENT_AMBULATORY_CARE_PROVIDER_SITE_OTHER): Payer: BLUE CROSS/BLUE SHIELD | Admitting: Internal Medicine

## 2018-07-24 ENCOUNTER — Encounter: Payer: Self-pay | Admitting: Internal Medicine

## 2018-07-24 VITALS — BP 140/78 | HR 73 | Temp 97.9°F | Resp 14 | Ht 65.0 in | Wt 138.6 lb

## 2018-07-24 DIAGNOSIS — Z124 Encounter for screening for malignant neoplasm of cervix: Secondary | ICD-10-CM | POA: Diagnosis not present

## 2018-07-24 DIAGNOSIS — Z0000 Encounter for general adult medical examination without abnormal findings: Secondary | ICD-10-CM

## 2018-07-24 DIAGNOSIS — Z23 Encounter for immunization: Secondary | ICD-10-CM

## 2018-07-24 DIAGNOSIS — E785 Hyperlipidemia, unspecified: Secondary | ICD-10-CM

## 2018-07-24 DIAGNOSIS — R7303 Prediabetes: Secondary | ICD-10-CM | POA: Diagnosis not present

## 2018-07-24 LAB — COMPREHENSIVE METABOLIC PANEL
ALT: 21 U/L (ref 0–35)
AST: 23 U/L (ref 0–37)
Albumin: 4.6 g/dL (ref 3.5–5.2)
Alkaline Phosphatase: 68 U/L (ref 39–117)
BUN: 16 mg/dL (ref 6–23)
CALCIUM: 9.7 mg/dL (ref 8.4–10.5)
CHLORIDE: 104 meq/L (ref 96–112)
CO2: 29 mEq/L (ref 19–32)
CREATININE: 0.83 mg/dL (ref 0.40–1.20)
GFR: 69.06 mL/min (ref >60.00)
Glucose, Bld: 97 mg/dL (ref 70–99)
Potassium: 4.3 mEq/L (ref 3.5–5.1)
SODIUM: 140 meq/L (ref 135–145)
Total Bilirubin: 0.6 mg/dL (ref 0.2–1.2)
Total Protein: 7.2 g/dL (ref 6.0–8.3)

## 2018-07-24 LAB — LIPID PANEL
Cholesterol: 244 mg/dL — ABNORMAL HIGH (ref 0–200)
HDL: 55.8 mg/dL (ref >39.00)
LDL CALC: 170 mg/dL — AB (ref 0–99)
NonHDL: 187.77
Total CHOL/HDL Ratio: 4
Triglycerides: 91 mg/dL (ref 0.0–149.0)
VLDL: 18.2 mg/dL (ref 0.0–40.0)

## 2018-07-24 LAB — HEMOGLOBIN A1C: Hgb A1c MFr Bld: 5.5 % (ref 4.6–6.5)

## 2018-07-24 LAB — TSH: TSH: 2.86 u[IU]/mL (ref 0.35–4.50)

## 2018-07-24 MED ORDER — RIZATRIPTAN BENZOATE 10 MG PO TBDP: 10.0000 mg | ORAL_TABLET | ORAL | 5 refills | Status: DC | PRN

## 2018-07-24 MED ORDER — AZITHROMYCIN 250 MG PO TABS: ORAL_TABLET | ORAL | 0 refills | Status: DC

## 2018-07-24 MED ORDER — SCOPOLAMINE 1 MG/3DAYS TD PT72: 1.0000 | MEDICATED_PATCH | TRANSDERMAL | 0 refills | Status: DC

## 2018-07-24 MED ORDER — METHOCARBAMOL 500 MG PO TABS: 500.0000 mg | ORAL_TABLET | Freq: Four times a day (QID) | ORAL | 2 refills | Status: DC | PRN

## 2018-07-24 MED ORDER — OSELTAMIVIR PHOSPHATE 75 MG PO CAPS: 75.0000 mg | ORAL_CAPSULE | Freq: Every day | ORAL | 0 refills | Status: DC

## 2018-07-24 MED ORDER — ONDANSETRON 4 MG PO TBDP: 4.0000 mg | ORAL_TABLET | Freq: Three times a day (TID) | ORAL | 6 refills | Status: DC | PRN

## 2018-07-24 NOTE — Patient Instructions (Signed)
Health Maintenance for Postmenopausal Women Menopause is a normal process in which your reproductive ability comes to an end. This process happens gradually over a span of months to years, usually between the ages of 62 and 89. Menopause is complete when you have missed 12 consecutive menstrual periods. It is important to talk with your health care provider about some of the most common conditions that affect postmenopausal women, such as heart disease, cancer, and bone loss (osteoporosis). Adopting a healthy lifestyle and getting preventive care can help to promote your health and wellness. Those actions can also lower your chances of developing some of these common conditions. What should I know about menopause? During menopause, you may experience a number of symptoms, such as:  Moderate-to-severe hot flashes.  Night sweats.  Decrease in sex drive.  Mood swings.  Headaches.  Tiredness.  Irritability.  Memory problems.  Insomnia. Choosing to treat or not to treat menopausal changes is an individual decision that you make with your health care provider. What should I know about hormone replacement therapy and supplements? Hormone therapy products are effective for treating symptoms that are associated with menopause, such as hot flashes and night sweats. Hormone replacement carries certain risks, especially as you become older. If you are thinking about using estrogen or estrogen with progestin treatments, discuss the benefits and risks with your health care provider. What should I know about heart disease and stroke? Heart disease, heart attack, and stroke become more likely as you age. This may be due, in part, to the hormonal changes that your body experiences during menopause. These can affect how your body processes dietary fats, triglycerides, and cholesterol. Heart attack and stroke are both medical emergencies. There are many things that you can do to help prevent heart disease  and stroke:  Have your blood pressure checked at least every 1-2 years. High blood pressure causes heart disease and increases the risk of stroke.  If you are 79-72 years old, ask your health care provider if you should take aspirin to prevent a heart attack or a stroke.  Do not use any tobacco products, including cigarettes, chewing tobacco, or electronic cigarettes. If you need help quitting, ask your health care provider.  It is important to eat a healthy diet and maintain a healthy weight. ? Be sure to include plenty of vegetables, fruits, low-fat dairy products, and lean protein. ? Avoid eating foods that are high in solid fats, added sugars, or salt (sodium).  Get regular exercise. This is one of the most important things that you can do for your health. ? Try to exercise for at least 150 minutes each week. The type of exercise that you do should increase your heart rate and make you sweat. This is known as moderate-intensity exercise. ? Try to do strengthening exercises at least twice each week. Do these in addition to the moderate-intensity exercise.  Know your numbers.Ask your health care provider to check your cholesterol and your blood glucose. Continue to have your blood tested as directed by your health care provider.  What should I know about cancer screening? There are several types of cancer. Take the following steps to reduce your risk and to catch any cancer development as early as possible. Breast Cancer  Practice breast self-awareness. ? This means understanding how your breasts normally appear and feel. ? It also means doing regular breast self-exams. Let your health care provider know about any changes, no matter how small.  If you are 40 or  older, have a clinician do a breast exam (clinical breast exam or CBE) every year. Depending on your age, family history, and medical history, it may be recommended that you also have a yearly breast X-ray (mammogram).  If you  have a family history of breast cancer, talk with your health care provider about genetic screening.  If you are at high risk for breast cancer, talk with your health care provider about having an MRI and a mammogram every year.  Breast cancer (BRCA) gene test is recommended for women who have family members with BRCA-related cancers. Results of the assessment will determine the need for genetic counseling and BRCA1 and for BRCA2 testing. BRCA-related cancers include these types: ? Breast. This occurs in males or females. ? Ovarian. ? Tubal. This may also be called fallopian tube cancer. ? Cancer of the abdominal or pelvic lining (peritoneal cancer). ? Prostate. ? Pancreatic. Cervical, Uterine, and Ovarian Cancer Your health care provider may recommend that you be screened regularly for cancer of the pelvic organs. These include your ovaries, uterus, and vagina. This screening involves a pelvic exam, which includes checking for microscopic changes to the surface of your cervix (Pap test).  For women ages 21-65, health care providers may recommend a pelvic exam and a Pap test every three years. For women ages 39-65, they may recommend the Pap test and pelvic exam, combined with testing for human papilloma virus (HPV), every five years. Some types of HPV increase your risk of cervical cancer. Testing for HPV may also be done on women of any age who have unclear Pap test results.  Other health care providers may not recommend any screening for nonpregnant women who are considered low risk for pelvic cancer and have no symptoms. Ask your health care provider if a screening pelvic exam is right for you.  If you have had past treatment for cervical cancer or a condition that could lead to cancer, you need Pap tests and screening for cancer for at least 20 years after your treatment. If Pap tests have been discontinued for you, your risk factors (such as having a new sexual partner) need to be reassessed  to determine if you should start having screenings again. Some women have medical problems that increase the chance of getting cervical cancer. In these cases, your health care provider may recommend that you have screening and Pap tests more often.  If you have a family history of uterine cancer or ovarian cancer, talk with your health care provider about genetic screening.  If you have vaginal bleeding after reaching menopause, tell your health care provider.  There are currently no reliable tests available to screen for ovarian cancer. Lung Cancer Lung cancer screening is recommended for adults 57-50 years old who are at high risk for lung cancer because of a history of smoking. A yearly low-dose CT scan of the lungs is recommended if you:  Currently smoke.  Have a history of at least 30 pack-years of smoking and you currently smoke or have quit within the past 15 years. A pack-year is smoking an average of one pack of cigarettes per day for one year. Yearly screening should:  Continue until it has been 15 years since you quit.  Stop if you develop a health problem that would prevent you from having lung cancer treatment. Colorectal Cancer  This type of cancer can be detected and can often be prevented.  Routine colorectal cancer screening usually begins at age 12 and continues through  age 75.  If you have risk factors for colon cancer, your health care provider may recommend that you be screened at an earlier age.  If you have a family history of colorectal cancer, talk with your health care provider about genetic screening.  Your health care provider may also recommend using home test kits to check for hidden blood in your stool.  A small camera at the end of a tube can be used to examine your colon directly (sigmoidoscopy or colonoscopy). This is done to check for the earliest forms of colorectal cancer.  Direct examination of the colon should be repeated every 5-10 years until  age 75. However, if early forms of precancerous polyps or small growths are found or if you have a family history or genetic risk for colorectal cancer, you may need to be screened more often. Skin Cancer  Check your skin from head to toe regularly.  Monitor any moles. Be sure to tell your health care provider: ? About any new moles or changes in moles, especially if there is a change in a mole's shape or color. ? If you have a mole that is larger than the size of a pencil eraser.  If any of your family members has a history of skin cancer, especially at a young age, talk with your health care provider about genetic screening.  Always use sunscreen. Apply sunscreen liberally and repeatedly throughout the day.  Whenever you are outside, protect yourself by wearing long sleeves, pants, a wide-brimmed hat, and sunglasses. What should I know about osteoporosis? Osteoporosis is a condition in which bone destruction happens more quickly than new bone creation. After menopause, you may be at an increased risk for osteoporosis. To help prevent osteoporosis or the bone fractures that can happen because of osteoporosis, the following is recommended:  If you are 19-50 years old, get at least 1,000 mg of calcium and at least 600 mg of vitamin D per day.  If you are older than age 50 but younger than age 70, get at least 1,200 mg of calcium and at least 600 mg of vitamin D per day.  If you are older than age 70, get at least 1,200 mg of calcium and at least 800 mg of vitamin D per day. Smoking and excessive alcohol intake increase the risk of osteoporosis. Eat foods that are rich in calcium and vitamin D, and do weight-bearing exercises several times each week as directed by your health care provider. What should I know about how menopause affects my mental health? Depression may occur at any age, but it is more common as you become older. Common symptoms of depression include:  Low or sad  mood.  Changes in sleep patterns.  Changes in appetite or eating patterns.  Feeling an overall lack of motivation or enjoyment of activities that you previously enjoyed.  Frequent crying spells. Talk with your health care provider if you think that you are experiencing depression. What should I know about immunizations? It is important that you get and maintain your immunizations. These include:  Tetanus, diphtheria, and pertussis (Tdap) booster vaccine.  Influenza every year before the flu season begins.  Pneumonia vaccine.  Shingles vaccine. Your health care provider may also recommend other immunizations. This information is not intended to replace advice given to you by your health care provider. Make sure you discuss any questions you have with your health care provider. Document Released: 08/17/2005 Document Revised: 01/13/2016 Document Reviewed: 03/29/2015 Elsevier Interactive Patient Education    2019 [place].

## 2018-07-24 NOTE — Progress Notes (Signed)
Patient ID: Brianna Calhoun, female    DOB: February 06, 1956  Age: 63 y.o. MRN: [id number]  The patient is here for annual  preventive examination and management of other chronic and acute problems.   The risk factors are reflected in the social history.  The roster of all physicians providing medical care to patient - is listed in the Snapshot section of the chart.  Activities of daily living:  The patient is 100% independent in all ADLs: dressing, toileting, feeding as well as independent mobility  Home safety : The patient has smoke detectors in the home. They wear seatbelts.  There are no firearms at home. There is no violence in the home.   There is no risks for hepatitis, STDs or HIV. There is no   history of blood transfusion. They have no travel history to infectious disease endemic areas of the world.  The patient has seen their dentist in the last six month. They have seen their eye doctor in the last year. They admit to slight hearing difficulty with regard to whispered voices and some television programs.  They have deferred audiologic testing in the last year.  They do not  have excessive sun exposure. Discussed the need for sun protection: hats, long sleeves and use of sunscreen if there is significant sun exposure.   Diet: the importance of a healthy diet is discussed. They do have a healthy diet.  The benefits of regular aerobic exercise were discussed. She exercises 6 days  per week ,  60 minutes.   Depression screen: there are no signs or vegative symptoms of depression- irritability, change in appetite, anhedonia, sadness/tearfullness.  Cognitive assessment: the patient manages all their financial and personal affairs and is actively engaged. They could relate day,date,year and events; recalled 2/3 objects at 3 minutes; performed clock-face test normally.  The following portions of the patient's history were reviewed and updated as appropriate: allergies, current medications,  past family history, past medical history,  past surgical history, past social history  and problem list.  Visual acuity was not assessed per patient preference since she has regular follow up with her ophthalmologist. Hearing and body mass index were assessed and reviewed.   During the course of the visit the patient was educated and counseled about appropriate screening and preventive services including : fall prevention , diabetes screening, nutrition counseling, colorectal cancer screening, and recommended immunizations.    CC: The primary encounter diagnosis was Hyperlipidemia LDL goal <130. Diagnoses of Prediabetes, Cervical cancer screening, Need for tetanus, diphtheria, and acellular pertussis (Tdap) vaccine, and Encounter for preventive health examination were also pertinent to this visit.   Requesting meds for prophylaxis to take on her cruise next week.  Uses tramadol prn for migraine headaches not relieved with advil and maxalt.  Still has some left. Averaging 7-8 days of headaches per month  Not all are migraines, some due to cerivcal disease . uses maxalt 4 times per month.  She has not had any ER visits  And has not requested any early refills.  Her Refill history was confirmed via [place] Controlled Substance database by me today during her visit and there have been no prescriptions of controlled substances filled from any providers other than me. . Last refill of tramadol was in July 2019  #30 qty   History Lachandra has a past medical history of Cancer ([place]), GERD (gastroesophageal reflux disease), and Hyperlipidemia.   She has a past surgical history that includes Colonoscopy (N/A, 12/13/2014); Cholecystectomy (  1994); Cesarean section (1984, 1986, [place]); Eye surgery (1998); Hernia repair (1996); and Left oophorectomy (Left, 2005).   Her family history includes Alcohol abuse in her mother; Arthritis in her mother; Breast cancer (age of onset: 5) in her maternal aunt; COPD in her  mother; Colon cancer in her father; Depression in her mother; Diabetes in her brother, father, and mother; Hearing loss in her mother; Heart disease in her mother; Hyperlipidemia in her brother, father, and mother; Hypertension in her brother, father, and mother; Kidney disease in her mother; Liver disease in her paternal grandfather; Mental illness in her sister; Skin cancer in her mother; Stroke in her maternal grandfather; Vision loss in her mother.She reports that she quit smoking about 40 years ago. Her smoking use included cigarettes. She has never used smokeless tobacco. She reports current alcohol use of about 4.0 standard drinks of alcohol per week. She reports that she does not use drugs.  Outpatient Medications Prior to Visit  Medication Sig Dispense Refill  . Ascorbic Acid (VITAMIN C) 100 MG tablet Take 100 mg by mouth daily.    . B Complex Vitamins (VITAMIN B COMPLEX PO) Take by mouth.    . cholecalciferol (VITAMIN D) 1000 units tablet Take 1,000 Units by mouth daily.    . cycloSPORINE (RESTASIS) 0.05 % ophthalmic emulsion Place 1 drop into both eyes 2 (two) times daily.    . Dermatological Products, Misc. (HYLATOPIC PLUS) CREA 1 application apply on the skin daily    . naproxen sodium (ANAPROX) 220 MG tablet Take 440 mg by mouth 2 (two) times daily as needed (headache).    . Olopatadine HCl 0.2 % SOLN Place 1 drop into both eyes daily as needed.    Marland Kitchen PICATO 0.015 % GEL APPLY TO FACE EVERY NIGHT AT BEDTIME    . rizatriptan (MAXALT) 10 MG tablet rizatriptan 10 mg tablet    . traMADol (ULTRAM) 50 MG tablet Take 1 tablet (50 mg total) by mouth every 6 (six) hours as needed. 30 tablet 0  . methocarbamol (ROBAXIN) 500 MG tablet   2  . doxycycline (VIBRA-TABS) 100 MG tablet Take 1 tablet (100 mg total) by mouth 2 (two) times daily. (Patient not taking: Reported on 07/24/2018) 20 tablet 0  . meclizine (ANTIVERT) 25 MG tablet Take 1 tablet (25 mg total) by mouth 3 (three) times daily as needed  for dizziness. (Patient not taking: Reported on 07/24/2018) 30 tablet 0  . ondansetron (ZOFRAN ODT) 4 MG disintegrating tablet Take 1 tablet (4 mg total) by mouth every 8 (eight) hours as needed for nausea or vomiting. (Patient not taking: Reported on 07/24/2018) 20 tablet 0   No facility-administered medications prior to visit.     Review of Systems   Patient denies headache, fevers, malaise, unintentional weight loss, skin rash, eye pain, sinus congestion and sinus pain, sore throat, dysphagia,  hemoptysis , cough, dyspnea, wheezing, chest pain, palpitations, orthopnea, edema, abdominal pain, nausea, melena, diarrhea, constipation, flank pain, dysuria, hematuria, urinary  Frequency, nocturia, numbness, tingling, seizures,  Focal weakness, Loss of consciousness,  Tremor, insomnia, depression, anxiety, and suicidal ideation.      Objective:  BP 140/78 (BP Location: Left Arm, Patient Position: Sitting, Cuff Size: Normal)   Pulse 73   Temp 97.9 F (36.6 C) (Oral)   Resp 14   Ht 5\' 5"  (1.651 m)   Wt 138 lb 9.6 oz (62.9 kg)   SpO2 95%   BMI 23.06 kg/m   Physical Exam  General  Appearance:    Alert, cooperative, no distress, appears stated age  Head:    Normocephalic, without obvious abnormality, atraumatic  Eyes:    PERRL, conjunctiva/corneas clear, EOM's intact, fundi    benign, both eyes  Ears:    Normal TM's and external ear canals, both ears  Nose:   Nares normal, septum midline, mucosa normal, no drainage    or sinus tenderness  Throat:   Lips, mucosa, and tongue normal; teeth and gums normal  Neck:   Supple, symmetrical, trachea midline, no adenopathy;    thyroid:  no enlargement/tenderness/nodules; no carotid   bruit or JVD  Back:     Symmetric, no curvature, ROM normal, no CVA tenderness  Lungs:     Clear to auscultation bilaterally, respirations unlabored  Chest Wall:    No tenderness or deformity   Heart:    Regular rate and rhythm, S1 and S2 normal, no murmur, rub   or  gallop  Breast Exam:    No tenderness, masses, or nipple abnormality  Abdomen:     Soft, non-tender, bowel sounds active all four quadrants,    no masses, no organomegaly  Genitalia:    Pelvic: cervix normal in appearance, external genitalia normal, no adnexal masses or tenderness, no cervical motion tenderness, rectovaginal septum normal, uterus normal size, shape, and consistency and vagina normal without discharge  Extremities:   Extremities normal, atraumatic, no cyanosis or edema  Pulses:   2+ and symmetric all extremities  Skin:   Skin color, texture, turgor normal, no rashes or lesions  Lymph nodes:   Cervical, supraclavicular, and axillary nodes normal  Neurologic:   CNII-XII intact, normal strength, sensation and reflexes    throughout   Assessment & Plan:   Problem List Items Addressed This Visit    Encounter for preventive health examination    age appropriate education and counseling updated, referrals for preventative services and immunizations addressed, dietary and smoking counseling addressed, most recent labs reviewed.  I have personally reviewed and have noted:  1) the patient's medical and social history 2) The pt's use of alcohol, tobacco, and illicit drugs 3) The patient's current medications and supplements 4) Functional ability including ADL's, fall risk, home safety risk, hearing and visual impairment 5) Diet and physical activities 6) Evidence for depression or mood disorder 7) The patient's height, weight, and BMI have been recorded in the chart  I have made referrals, and provided counseling and education based on review of the above      Hyperlipidemia LDL goal <130 - Primary    10 yr risk is 8.5% untreated.  Has statin intolerance,  Repatha has been denied by insurance .  No treatment needed  Lab Results  Component Value Date   CHOL 244 (H) 07/24/2018   HDL 55.80 07/24/2018   LDLCALC 170 (H) 07/24/2018   TRIG 91.0 07/24/2018   CHOLHDL 4 07/24/2018           Relevant Orders   Lipid panel (Completed)   TSH (Completed)   Prediabetes    She has a history of an A1c in the past of 5.7 or 5.8,  But it has been  5.5 since Calhoun 2019   Fasting glucose is normal. continue periodic surveillance   Lab Results  Component Value Date   HGBA1C 5.5 07/24/2018         Relevant Orders   Comprehensive metabolic panel (Completed)   Hemoglobin A1c (Completed)    Other Visit Diagnoses    Cervical  cancer screening       Relevant Orders   Cytology - PAP( [place])   Need for tetanus, diphtheria, and acellular pertussis (Tdap) vaccine       Relevant Orders   Tdap vaccine greater than or equal to 7yo IM (Completed)      I have changed Dotti L. Magloire's methocarbamol. I am also having her start on scopolamine, oseltamivir, azithromycin, and rizatriptan. Additionally, I am having her maintain her cycloSPORINE, Olopatadine HCl, naproxen sodium, cholecalciferol, meclizine, traMADol, doxycycline, HYLATOPIC PLUS, PICATO, rizatriptan, vitamin C, B Complex Vitamins (VITAMIN B COMPLEX PO), and ondansetron.  Meds ordered this encounter  Medications  . ondansetron (ZOFRAN ODT) 4 MG disintegrating tablet    Sig: Take 1 tablet (4 mg total) by mouth every 8 (eight) hours as needed for nausea or vomiting.    Dispense:  20 tablet    Refill:  6  . scopolamine (TRANSDERM-SCOP, 1.5 MG,) 1 MG/3DAYS    Sig: Place 1 patch (1.5 mg total) onto the skin every 3 (three) days.    Dispense:  4 patch    Refill:  0  . oseltamivir (TAMIFLU) 75 MG capsule    Sig: Take 1 capsule (75 mg total) by mouth daily.    Dispense:  10 capsule    Refill:  0  . azithromycin (ZITHROMAX) 250 MG tablet    Sig: 2 tablets one day 1,  One tablet daily until gone    Dispense:  6 tablet    Refill:  0  . methocarbamol (ROBAXIN) 500 MG tablet    Sig: Take 1 tablet (500 mg total) by mouth every 6 (six) hours as needed for muscle spasms.    Dispense:  60 tablet    Refill:  2  .  rizatriptan (MAXALT-MLT) 10 MG disintegrating tablet    Sig: Take 1 tablet (10 mg total) by mouth as needed for migraine. May repeat in 2 hours if needed    Dispense:  10 tablet    Refill:  5    Medications Discontinued During This Encounter  Medication Reason  . ondansetron (ZOFRAN ODT) 4 MG disintegrating tablet Reorder  . methocarbamol (ROBAXIN) 500 MG tablet Reorder    Follow-up: No follow-ups on file.   Crecencio Mc, MD

## 2018-07-26 NOTE — Assessment & Plan Note (Signed)
She has a history of an A1c in the past of 5.7 or 5.8,  But it has been  5.5 since April 2019   Fasting glucose is normal. continue periodic surveillance   Lab Results  Component Value Date   HGBA1C 5.5 07/24/2018

## 2018-07-26 NOTE — Assessment & Plan Note (Signed)

## 2018-07-26 NOTE — Assessment & Plan Note (Signed)
10 yr risk is 8.5% untreated.  Has statin intolerance,  Repatha has been denied by insurance .  No treatment needed  Lab Results  Component Value Date   CHOL 244 (H) 07/24/2018   HDL 55.80 07/24/2018   LDLCALC 170 (H) 07/24/2018   TRIG 91.0 07/24/2018   CHOLHDL 4 07/24/2018

## 2018-07-28 LAB — CYTOLOGY - PAP
Diagnosis: NEGATIVE
HPV (WINDOPATH): NOT DETECTED

## 2018-08-13 ENCOUNTER — Encounter: Payer: BLUE CROSS/BLUE SHIELD | Admitting: Internal Medicine

## 2018-08-14 ENCOUNTER — Ambulatory Visit
Admission: RE | Admit: 2018-08-14 | Discharge: 2018-08-14 | Disposition: A | Discharge status: Home / Self Care | Payer: BLUE CROSS/BLUE SHIELD | Source: Ambulatory Visit | Attending: Internal Medicine | Admitting: Internal Medicine

## 2018-08-14 DIAGNOSIS — Z1231 Encounter for screening mammogram for malignant neoplasm of breast: Secondary | ICD-10-CM | POA: Diagnosis not present

## 2018-09-01 DIAGNOSIS — L821 Other seborrheic keratosis: Secondary | ICD-10-CM | POA: Diagnosis not present

## 2018-09-01 DIAGNOSIS — Z85828 Personal history of other malignant neoplasm of skin: Secondary | ICD-10-CM | POA: Diagnosis not present

## 2018-09-01 DIAGNOSIS — L570 Actinic keratosis: Secondary | ICD-10-CM | POA: Diagnosis not present

## 2018-09-01 DIAGNOSIS — L820 Inflamed seborrheic keratosis: Secondary | ICD-10-CM | POA: Diagnosis not present

## 2019-01-16 ENCOUNTER — Other Ambulatory Visit: Payer: Self-pay

## 2019-01-16 ENCOUNTER — Encounter: Payer: Self-pay | Admitting: Internal Medicine

## 2019-01-16 ENCOUNTER — Ambulatory Visit (INDEPENDENT_AMBULATORY_CARE_PROVIDER_SITE_OTHER): Payer: Medicare Other | Admitting: Internal Medicine

## 2019-01-16 DIAGNOSIS — Z7189 Other specified counseling: Secondary | ICD-10-CM | POA: Insufficient documentation

## 2019-01-16 DIAGNOSIS — N6324 Unspecified lump in the left breast, lower inner quadrant: Secondary | ICD-10-CM | POA: Diagnosis not present

## 2019-01-16 NOTE — Assessment & Plan Note (Signed)
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  as Educated patient on the newly broadened list of signs and symptoms of COVID-19 infection and ways to avoid the viral infection including washing hands frequently with soap and water,  using hand sanitizer if unable to wash, avoiding touching face,  staying at home and limiting visitors,  and avoiding contact with people coming in and out of home.  Discussed the potential ineffectiveness of hand sanitizer if left in environments > 110 degrees (ie , the car).  Reminded patient to call office with questions/concerns.  The importance of social distancing was discussed today

## 2019-01-16 NOTE — Progress Notes (Addendum)
Virtual Visit via [email or web address]  This visit type was conducted due to national recommendations for restrictions regarding the COVID-19 pandemic (e.g. social distancing).  This format is felt to be most appropriate for this patient at this time.  All issues noted in this document were discussed and addressed.  No physical exam was performed (except for noted visual exam findings with Video Visits).   I connected with@ on 01/16/19 at  8:00 AM EDT by a video enabled telemedicine application or telephone and verified that I am speaking with the correct person using two identifiers. Location patient: home Location provider: work or home office Persons participating in the virtual visit: patient, provider  I discussed the limitations, risks, security and privacy concerns of performing an evaluation and management service by telephone and the availability of in person appointments. I also discussed with the patient that there may be a patient responsible charge related to this service. The patient expressed understanding and agreed to proceed.  Reason for visit: follow up , new breast lump  HPI:   63 yr old female with history   Of migraines , GERD, hyperlipidemia presents with recent finding of a pea sized palpable lump , non tender in her left breast medial to the nipple.  Mobile,  Present for several days  ,  Normal mammogram in February.  No recent URI  Symptoms, but states that one week earlier she had some mild chest pain vs GERD symptoms.   She has  been staying out at [facility] with husband Gerald Stabs and extended family for the past 3 weeks .  No unmasked contact except with immediate family  .  The patient has no signs or symptoms of COVID 19 infection (fever, cough, sore throat  or shortness of breath beyond what is typical for patient).  Patient denies contact with other persons with the above mentioned symptoms or with anyone confirmed to have COVID 19   ROS: Patient denies headache, fevers,  malaise, unintentional weight loss, skin rash, eye pain, sinus congestion and sinus pain, sore throat, dysphagia,  hemoptysis , cough, dyspnea, wheezing, chest pain, palpitations, orthopnea, edema, abdominal pain, nausea, melena, diarrhea, constipation, flank pain, dysuria, hematuria, urinary  Frequency, nocturia, numbness, tingling, seizures,  Focal weakness, Loss of consciousness,  Tremor, insomnia, depression, anxiety, and suicidal ideation.      Past Medical History:  Diagnosis Date  . Cancer ([place])    skin  . GERD (gastroesophageal reflux disease)   . Hyperlipidemia     Past Surgical History:  Procedure Laterality Date  . [place]  . CHOLECYSTECTOMY  1994  . COLONOSCOPY N/A 12/13/2014   Procedure: COLONOSCOPY;  Surgeon: Manya Silvas, MD;  Location: [facility] ENDOSCOPY;  Service: Endoscopy;  Laterality: N/A;  . EYE SURGERY  1998  . HERNIA REPAIR  1996   left hernia repair  . LEFT OOPHORECTOMY Left 2005   multiple complex cysts    Family History  Problem Relation Age of Onset  . Breast cancer Maternal Aunt 65  . Alcohol abuse Mother   . Arthritis Mother   . Skin cancer Mother   . COPD Mother   . Depression Mother   . Diabetes Mother   . Hearing loss Mother   . Heart disease Mother   . Hyperlipidemia Mother   . Hypertension Mother   . Kidney disease Mother   . Vision loss Mother   . Colon cancer Father   . Diabetes Father   .  Hyperlipidemia Father   . Hypertension Father   . Mental illness Sister   . Hyperlipidemia Brother   . Hypertension Brother   . Diabetes Brother   . Stroke Maternal Grandfather   . Liver disease Paternal Grandfather     SOCIAL HX:  reports that she quit smoking about 40 years ago. Her smoking use included cigarettes. She has never used smokeless tobacco. She reports current alcohol use of about 4.0 standard drinks of alcohol per week. She reports that she does not use drugs.   Current Outpatient Medications:   .  Ascorbic Acid (VITAMIN C) 100 MG tablet, Take 100 mg by mouth daily., Disp: , Rfl:  .  B Complex Vitamins (VITAMIN B COMPLEX PO), Take by mouth., Disp: , Rfl:  .  cholecalciferol (VITAMIN D) 1000 units tablet, Take 1,000 Units by mouth daily., Disp: , Rfl:  .  cycloSPORINE (RESTASIS) 0.05 % ophthalmic emulsion, Place 1 drop into both eyes 2 (two) times daily., Disp: , Rfl:  .  Dermatological Products, Misc. (HYLATOPIC PLUS) CREA, 1 application apply on the skin daily, Disp: , Rfl:  .  methocarbamol (ROBAXIN) 500 MG tablet, Take 1 tablet (500 mg total) by mouth every 6 (six) hours as needed for muscle spasms., Disp: 60 tablet, Rfl: 2 .  naproxen sodium (ANAPROX) 220 MG tablet, Take 440 mg by mouth 2 (two) times daily as needed (headache)., Disp: , Rfl:  .  Olopatadine HCl 0.2 % SOLN, Place 1 drop into both eyes daily as needed., Disp: , Rfl:  .  ondansetron (ZOFRAN ODT) 4 MG disintegrating tablet, Take 1 tablet (4 mg total) by mouth every 8 (eight) hours as needed for nausea or vomiting., Disp: 20 tablet, Rfl: 6 .  rizatriptan (MAXALT-MLT) 10 MG disintegrating tablet, Take 1 tablet (10 mg total) by mouth as needed for migraine. May repeat in 2 hours if needed, Disp: 10 tablet, Rfl: 5 .  traMADol (ULTRAM) 50 MG tablet, Take 1 tablet (50 mg total) by mouth every 6 (six) hours as needed., Disp: 30 tablet, Rfl: 0  EXAM:  VITALS per patient if applicable:  GENERAL: alert, oriented, appears well and in no acute distress  HEENT: atraumatic, conjunttiva clear, no obvious abnormalities on inspection of external nose and ears  NECK: normal movements of the head and neck  LUNGS: on inspection no signs of respiratory distress, breathing rate appears normal, no obvious gross SOB, gasping or wheezing  CV: no obvious cyanosis  MS: moves all visible extremities without noticeable abnormality  PSYCH/NEURO: pleasant and cooperative, no obvious depression or anxiety, speech and thought processing  grossly intact  ASSESSMENT AND PLAN:   Breast lump on left side at 7 o'clock position Advised to monitor for 1-2 weeks,  If still present will refer to [place] for ultrasound/biopsy  Educated About Covid-19 Virus Infection Spent 20 of 25 minute visit discussing the current pandemic ,  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  as Educated patient on the newly broadened list of signs and symptoms of COVID-19 infection and ways to avoid the viral infection including washing hands frequently with soap and water,  using hand sanitizer if unable to wash, avoiding touching face,  staying at home and limiting visitors,  and avoiding contact with people coming in and out of home.  Discussed the potential ineffectiveness of hand sanitizer  if left in environments > 110 degrees (ie , the car).  Reminded patient to call office with questions/concerns.  The importance of social distancing was discussed today    I discussed the assessment and treatment plan with the patient. The patient was provided an opportunity to ask questions and all were answered. The patient agreed with the plan and demonstrated an understanding of the instructions.   The patient was advised to call back or seek an in-person evaluation if the symptoms worsen or if the condition fails to improve as anticipated.  I provided 25 minutes of non-face-to-face time during this encounter.   Crecencio Mc, MD

## 2019-01-16 NOTE — Assessment & Plan Note (Signed)
Advised to monitor for 1-2 weeks,  If still present will refer to [place] for ultrasound/biopsy

## 2019-01-19 ENCOUNTER — Other Ambulatory Visit: Payer: Self-pay

## 2019-01-19 ENCOUNTER — Encounter: Payer: Self-pay | Admitting: General Surgery

## 2019-01-19 ENCOUNTER — Ambulatory Visit (INDEPENDENT_AMBULATORY_CARE_PROVIDER_SITE_OTHER): Payer: Medicare Other

## 2019-01-19 ENCOUNTER — Ambulatory Visit (INDEPENDENT_AMBULATORY_CARE_PROVIDER_SITE_OTHER): Payer: Medicare Other | Admitting: General Surgery

## 2019-01-19 VITALS — BP 140/80 | HR 74 | Temp 98.1°F | Ht 65.0 in | Wt 140.0 lb

## 2019-01-19 DIAGNOSIS — N6320 Unspecified lump in the left breast, unspecified quadrant: Secondary | ICD-10-CM | POA: Diagnosis not present

## 2019-01-19 NOTE — Progress Notes (Signed)
Patient ID: Brianna Calhoun, female   DOB: [birth year], 63 y.o.   MRN: [id number]  Chief Complaint  Patient presents with  . Other    HPI Brianna Calhoun is a 63 y.o. female who presents for a breast evaluation. The most recent mammogram was done on 2/20 . Patient noticed a lump in her left breast on 01/13/2019.  Patient does perform regular self breast checks and gets regular mammograms done.    HPI  Past Medical History:  Diagnosis Date  . Cancer ([place])    skin  . GERD (gastroesophageal reflux disease)   . Hyperlipidemia     Past Surgical History:  Procedure Laterality Date  . [place]  . CHOLECYSTECTOMY  1994  . COLONOSCOPY N/A 12/13/2014   Procedure: COLONOSCOPY;  Surgeon: Manya Silvas, MD;  Location: [facility] ENDOSCOPY;  Service: Endoscopy;  Laterality: N/A;  . EYE SURGERY  1998  . HERNIA REPAIR  1996   left hernia repair  . LEFT OOPHORECTOMY Left 2005   multiple complex cysts    Family History  Problem Relation Age of Onset  . Breast cancer Maternal Aunt 65  . Alcohol abuse Mother   . Arthritis Mother   . Skin cancer Mother   . COPD Mother   . Depression Mother   . Diabetes Mother   . Hearing loss Mother   . Heart disease Mother   . Hyperlipidemia Mother   . Hypertension Mother   . Kidney disease Mother   . Vision loss Mother   . Colon cancer Father   . Diabetes Father   . Hyperlipidemia Father   . Hypertension Father   . Mental illness Sister   . Hyperlipidemia Brother   . Hypertension Brother   . Diabetes Brother   . Stroke Maternal Grandfather   . Liver disease Paternal Grandfather     Social History Social History   Tobacco Use  . Smoking status: Former Smoker    Types: Cigarettes    Quit date: 1980    Years since quitting: 40.5  . Smokeless tobacco: Never Used  Substance Use Topics  . Alcohol use: Yes    Alcohol/week: 4.0 standard drinks    Types: 4 Glasses of wine per week  . Drug use: No    Allergies   Allergen Reactions  . Amoxicillin Rash  . Contrast Media [Iodinated Diagnostic Agents] Hives and Rash    Break through reaction with pre-medication - Hives    Current Outpatient Medications  Medication Sig Dispense Refill  . Ascorbic Acid (VITAMIN C) 100 MG tablet Take 100 mg by mouth daily.    . B Complex Vitamins (VITAMIN B COMPLEX PO) Take by mouth.    . cholecalciferol (VITAMIN D) 1000 units tablet Take 1,000 Units by mouth daily.    . cycloSPORINE (RESTASIS) 0.05 % ophthalmic emulsion Place 1 drop into both eyes 2 (two) times daily.    . Dermatological Products, Misc. (HYLATOPIC PLUS) CREA 1 application apply on the skin daily    . methocarbamol (ROBAXIN) 500 MG tablet Take 1 tablet (500 mg total) by mouth every 6 (six) hours as needed for muscle spasms. 60 tablet 2  . naproxen sodium (ANAPROX) 220 MG tablet Take 440 mg by mouth 2 (two) times daily as needed (headache).    . Olopatadine HCl 0.2 % SOLN Place 1 drop into both eyes daily as needed.    . ondansetron (ZOFRAN ODT) 4 MG disintegrating tablet Take 1 tablet (  4 mg total) by mouth every 8 (eight) hours as needed for nausea or vomiting. 20 tablet 6  . rizatriptan (MAXALT-MLT) 10 MG disintegrating tablet Take 1 tablet (10 mg total) by mouth as needed for migraine. May repeat in 2 hours if needed 10 tablet 5  . traMADol (ULTRAM) 50 MG tablet Take 1 tablet (50 mg total) by mouth every 6 (six) hours as needed. 30 tablet 0   No current facility-administered medications for this visit.     Review of Systems Review of Systems  Constitutional: Negative.   Respiratory: Negative.   Cardiovascular: Negative.     Blood pressure 140/80, pulse 74, temperature 98.1 F (36.7 C), temperature source Skin, height 5\' 5"  (1.651 m), weight 140 lb (63.5 kg), SpO2 98 %.  Physical Exam Physical Exam Exam conducted with a chaperone present.  Constitutional:      Appearance: She is well-developed.  Eyes:     General: No scleral icterus.     Conjunctiva/sclera: Conjunctivae normal.  Neck:     Musculoskeletal: Neck supple.  Cardiovascular:     Rate and Rhythm: Normal rate and regular rhythm.     Heart sounds: Normal heart sounds.  Pulmonary:     Effort: Pulmonary effort is normal.     Breath sounds: Normal breath sounds.  Chest:     Breasts:        Right: Normal.        Left: No nipple discharge, skin change or tenderness.    Lymphadenopathy:     Cervical: No cervical adenopathy.     Upper Body:     Right upper body: No supraclavicular or axillary adenopathy.     Left upper body: No supraclavicular or axillary adenopathy.  Skin:    General: Skin is warm and dry.  Neurological:     Mental Status: She is alert and oriented to person, place, and time.     Data Reviewed August 14, 2018 bilateral screening mammograms were independently reviewed.  Moderately dense scattered residual breast parenchyma.  BI-RADS-1.  Ultrasound examination of the lower inner quadrant of the left breast was undertaken.  There was perhaps a focal density measuring 0.5 x 0.6 x 0.7cm corresponding the area of palpable thickening there was isoechoic with the adjacent adipose tissue.  This may be a focal lipoma-like area.  No increased vascular flow on duplex imaging.  BI-RADS-3.  Assessment Recently noted left breast mass.  Benign clinical exam.  Normal mammogram was 5 months ago.  Plan My suspicions for a pathologic process are small.  The patient had spent 3 weeks in the late riding in a boat and tubing with grandchildren prior to the addition of this lesion, and may be a small area of bruise tissue.  She has been asked to avoid manipulating the area and plan on a final recheck in 6 weeks.  The patient is aware to call back for any questions or concerns.   HPI, Physical Exam, Assessment and Plan have been scribed under the direction and in the presence of Brianna Ard, MD.  Brianna Calhoun, CMA I have completed the exam and reviewed  the above documentation for accuracy and completeness.  I agree with the above.  Haematologist has been used and any errors in dictation or transcription are unintentional.  Brianna Calhoun, M.D., F.A.C.S.   Forest Gleason Guynell Kleiber 01/21/2019, 6:08 PM

## 2019-01-19 NOTE — Patient Instructions (Signed)
Return in 6 weeks. The patient is aware to call back for any questions or concerns.

## 2019-03-26 ENCOUNTER — Ambulatory Visit: Payer: Medicare Other | Admitting: General Surgery

## 2019-04-29 ENCOUNTER — Encounter: Payer: Self-pay | Admitting: *Deleted

## 2019-06-02 LAB — SURGICAL PATHOLOGY

## 2019-06-16 ENCOUNTER — Other Ambulatory Visit: Payer: Self-pay | Admitting: General Surgery

## 2019-06-16 DIAGNOSIS — Z1231 Encounter for screening mammogram for malignant neoplasm of breast: Secondary | ICD-10-CM

## 2019-06-16 DIAGNOSIS — Z0000 Encounter for general adult medical examination without abnormal findings: Secondary | ICD-10-CM

## 2019-06-16 NOTE — Progress Notes (Signed)
img 

## 2019-07-29 ENCOUNTER — Encounter: Payer: Self-pay | Admitting: Internal Medicine

## 2019-07-29 ENCOUNTER — Other Ambulatory Visit: Payer: Self-pay

## 2019-07-29 ENCOUNTER — Ambulatory Visit (INDEPENDENT_AMBULATORY_CARE_PROVIDER_SITE_OTHER): Payer: Medicare Other | Admitting: Internal Medicine

## 2019-07-29 VITALS — BP 130/76 | Ht 65.0 in | Wt 137.4 lb

## 2019-07-29 DIAGNOSIS — E785 Hyperlipidemia, unspecified: Secondary | ICD-10-CM | POA: Diagnosis not present

## 2019-07-29 DIAGNOSIS — Z1211 Encounter for screening for malignant neoplasm of colon: Secondary | ICD-10-CM

## 2019-07-29 DIAGNOSIS — R7303 Prediabetes: Secondary | ICD-10-CM

## 2019-07-29 DIAGNOSIS — Z0000 Encounter for general adult medical examination without abnormal findings: Secondary | ICD-10-CM

## 2019-07-29 DIAGNOSIS — N6324 Unspecified lump in the left breast, lower inner quadrant: Secondary | ICD-10-CM

## 2019-07-29 DIAGNOSIS — E559 Vitamin D deficiency, unspecified: Secondary | ICD-10-CM

## 2019-07-29 DIAGNOSIS — Z8669 Personal history of other diseases of the nervous system and sense organs: Secondary | ICD-10-CM

## 2019-07-29 DIAGNOSIS — R030 Elevated blood-pressure reading, without diagnosis of hypertension: Secondary | ICD-10-CM

## 2019-07-29 NOTE — Progress Notes (Signed)
Virtual Visit via [email or web address]  This visit type was conducted due to national recommendations for restrictions regarding the COVID-19 pandemic (e.g. social distancing).  This format is felt to be most appropriate for this patient at this time.  All issues noted in this document were discussed and addressed.  No physical exam was performed (except for noted visual exam findings with Video Visits).   I connected with@ on 07/29/19 at  8:00 AM EST by a video enabled telemedicine application and verified that I am speaking with the correct person using two identifiers. Location patient: home Location provider: work or home office Persons participating in  the virtual visit: patient, provider  I discussed the limitations, risks, security and privacy concerns of performing an evaluation and management service by telephone and the availability of in person appointments. I also discussed with the patient that there may be a patient responsible charge related to this service. The patient expressed understanding and agreed to proceed.  Reason for visit: follow up on multiple issues   HPI:  64 yr old female with mild hyperlipidemia, recurrent migraine headaches,  prediabetes here for follow up  Headaches:  She has been using robaxin and tramadol prn for headaches. Occurring almost daily . History of migraines,  But having chronic neck pain and other types of headaches as well.  No recent neurology evaluation.  Discussed potential use of Botox injections for headache management.  .   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  Past  Medical History:  Diagnosis Date  . Cancer ([place])    skin  . GERD (gastroesophageal reflux disease)   . Hyperlipidemia      Past Surgical History:  Procedure Laterality Date  . [place]  . CHOLECYSTECTOMY  1994  . COLONOSCOPY N/A 12/13/2014   Procedure: COLONOSCOPY;  Surgeon: Manya Silvas, MD;  Location: [facility] ENDOSCOPY;  Service: Endoscopy;  Laterality: N/A;  . EYE SURGERY  1998  . HERNIA REPAIR  1996   left hernia repair  . LEFT OOPHORECTOMY Left 2005   multiple complex cysts    Family History  Problem Relation Age of Onset  . Breast cancer Maternal Aunt 65  . Alcohol abuse Mother   . Arthritis Mother   . Skin cancer Mother   . COPD Mother   . Depression Mother   . Diabetes Mother   . Hearing loss Mother   . Heart disease Mother   . Hyperlipidemia Mother   . Hypertension Mother   . Kidney disease Mother   . Vision loss Mother   . Colon cancer Father   . Diabetes Father   . Hyperlipidemia Father   . Hypertension Father   . Mental illness Sister   . Hyperlipidemia Brother   . Hypertension Brother   . Diabetes Brother   . Stroke Maternal Grandfather   . Liver disease Paternal Grandfather     SOCIAL HX:  reports that she quit smoking about 41 years ago. Her smoking use included cigarettes. She has never used smokeless tobacco. She reports current alcohol use of about 4.0 standard drinks  of alcohol per week. She reports that she does not use drugs.   Current Outpatient Medications:  .  Ascorbic Acid (VITAMIN C) 100 MG tablet, Take 100 mg by mouth daily., Disp: , Rfl:  .  B Complex Vitamins (VITAMIN B COMPLEX PO), Take by mouth., Disp: , Rfl:  .  cholecalciferol (VITAMIN D) 1000 units tablet, Take 1,000 Units by mouth daily., Disp: , Rfl:  .  cycloSPORINE (RESTASIS) 0.05 % ophthalmic emulsion, Place 1 drop into both eyes 2 (two) times daily., Disp: , Rfl:  .  Dermatological Products, Misc. (HYLATOPIC PLUS) CREA, 1 application apply  on the skin daily, Disp: , Rfl:  .  methocarbamol (ROBAXIN) 500 MG tablet, Take 1 tablet (500 mg total) by mouth every 6 (six) hours as needed for muscle spasms., Disp: 60 tablet, Rfl: 2 .  naproxen sodium (ANAPROX) 220 MG tablet, Take 440 mg by mouth 2 (two) times daily as needed (headache)., Disp: , Rfl:  .  Olopatadine HCl 0.2 % SOLN, Place 1 drop into both eyes daily as needed., Disp: , Rfl:  .  ondansetron (ZOFRAN ODT) 4 MG disintegrating tablet, Take 1 tablet (4 mg total) by mouth every 8 (eight) hours as needed for nausea or vomiting., Disp: 20 tablet, Rfl: 6 .  rizatriptan (MAXALT-MLT) 10 MG disintegrating tablet, Take 1 tablet (10 mg total) by mouth as needed for migraine. May repeat in 2 hours if needed, Disp: 10 tablet, Rfl: 5 .  traMADol (ULTRAM) 50 MG tablet, Take 1 tablet (50 mg total) by mouth every 6 (six) hours as needed., Disp: 30 tablet, Rfl: 0  EXAM:  VITALS per patient if applicable:  GENERAL: alert, oriented, appears well and in no acute distress  HEENT: atraumatic, conjunttiva clear, no obvious abnormalities on inspection of external nose and ears  NECK: normal movements of the head and neck  LUNGS: on inspection no signs of respiratory distress, breathing rate appears normal, no obvious gross SOB, gasping or wheezing  CV: no obvious cyanosis  MS: moves all visible extremities without noticeable abnormality  PSYCH/NEURO: pleasant and cooperative, no obvious depression or anxiety, speech and thought processing grossly intact  ASSESSMENT AND PLAN:  Discussed the following assessment and plan:  Prediabetes - Plan: Hemoglobin A1c  Hyperlipidemia LDL goal <130 - Plan: Lipid panel, TSH  Elevated blood pressure reading in office without diagnosis of hypertension - Plan: Comprehensive metabolic panel, Microalbumin / creatinine urine ratio  Vitamin D insufficiency - Plan: VITAMIN D 25 Hydroxy (Vit-D Deficiency, Fractures)  Encounter for preventive health  examination  History of migraine headaches  Breast lump on left side at 7 o'clock position  Colon cancer screening - Plan: Ambulatory referral to General Surgery  Prediabetes She has a history of an A1c in the past of 5.7 or 5.8,  But it has been  5.5 since April 2019   Fasting glucose is normal. continue periodic surveillance   Lab Results  Component Value Date   HGBA1C 5.5 07/24/2018     Hyperlipidemia LDL goal <130 10 yr risk is 8.5%.  lipids are  untreated.  Has statin intolerance (prior trial of Crestor in 2016 advised by Serafina Royals for $% 10 yr risk)  And  Repatha has been denied by insurance .  No treatment needed as she has no other risk factors for CAD and no atherosclerosis was noted on 2018 CT abd and pelvis. Continue annual surveillance. Has deferred cardiac CT   Lab Results  Component Value Date   CHOL  244 (H) 07/24/2018   HDL 55.80 07/24/2018   LDLCALC 170 (H) 07/24/2018   TRIG 91.0 07/24/2018   CHOLHDL 4 07/24/2018      Encounter for preventive health examination age appropriate education and counseling updated, referrals for preventative services and immunizations addressed, dietary and smoking counseling addressed, most recent labs reviewed.  I have personally reviewed and have noted:  1) the patient's medical and social history 2) The pt's use of alcohol, tobacco, and illicit drugs 3) The patient's current medications and supplements 4) Functional ability including ADL's, fall risk, home safety risk, hearing and visual impairment 5) Diet and physical activities 6) Evidence for depression or mood disorder 7) The patient's height, weight, and BMI have been recorded in the chart  I have made referrals, and provided counseling and education based on review of the above  History of migraine headaches Referral to neurology for consideration of f Botox injections to reduce frequency   Breast lump on left side at 7 o'clock position Referred to to [place] for  ultrasound/biopsy and removed.  Lipoma by path report     I discussed the assessment and treatment plan with the patient. The patient was provided an opportunity to ask questions and all were answered. The patient agreed with the plan and demonstrated an understanding of the instructions.   The patient was advised to call back or seek an in-person evaluation if the symptoms worsen or if the condition fails to improve as anticipated.    I provided  30 minutes of non-face-to-face time during this encounter reviewing patient's current problems and past procedures/imaging studies, providing counseling on the above mentioned problems , and coordination  of care .   Crecencio Mc, MD

## 2019-07-30 ENCOUNTER — Telehealth: Payer: Self-pay | Admitting: Internal Medicine

## 2019-07-30 DIAGNOSIS — Z8669 Personal history of other diseases of the nervous system and sense organs: Secondary | ICD-10-CM | POA: Insufficient documentation

## 2019-07-30 NOTE — Assessment & Plan Note (Signed)

## 2019-07-30 NOTE — Assessment & Plan Note (Signed)
Referral to neurology for consideration of f Botox injections to reduce frequency

## 2019-07-30 NOTE — Assessment & Plan Note (Signed)
She has a history of an A1c in the past of 5.7 or 5.8,  But it has been  5.5 since April 2019   Fasting glucose is normal. continue periodic surveillance   Lab Results  Component Value Date   HGBA1C 5.5 07/24/2018

## 2019-07-30 NOTE — Assessment & Plan Note (Signed)
Referred to to [facility] for ultrasound/biopsy and removed.  Lipoma by path report

## 2019-07-30 NOTE — Assessment & Plan Note (Signed)
10 yr risk is 8.5%.  lipids are  untreated.  Has statin intolerance (prior trial of Crestor in 2016 advised by Serafina Royals for $% 10 yr risk)  And  Repatha has been denied by insurance .  No treatment needed as she has no other risk factors for CAD and no atherosclerosis was noted on 2018 CT abd and pelvis. Continue annual surveillance. Has deferred cardiac CT   Lab Results  Component Value Date   CHOL 244 (H) 07/24/2018   HDL 55.80 07/24/2018   LDLCALC 170 (H) 07/24/2018   TRIG 91.0 07/24/2018   CHOLHDL 4 07/24/2018

## 2019-08-05 ENCOUNTER — Encounter: Payer: Self-pay | Admitting: Internal Medicine

## 2019-08-15 ENCOUNTER — Ambulatory Visit: Payer: Medicare Other

## 2019-08-24 ENCOUNTER — Other Ambulatory Visit: Payer: Medicare Other

## 2019-08-24 ENCOUNTER — Other Ambulatory Visit: Payer: Self-pay

## 2019-08-24 ENCOUNTER — Other Ambulatory Visit (INDEPENDENT_AMBULATORY_CARE_PROVIDER_SITE_OTHER): Payer: Medicare Other

## 2019-08-24 ENCOUNTER — Ambulatory Visit (INDEPENDENT_AMBULATORY_CARE_PROVIDER_SITE_OTHER): Payer: Medicare Other | Admitting: *Deleted

## 2019-08-24 VITALS — BP 140/82 | HR 78 | Resp 16

## 2019-08-24 DIAGNOSIS — E559 Vitamin D deficiency, unspecified: Secondary | ICD-10-CM | POA: Diagnosis not present

## 2019-08-24 DIAGNOSIS — E785 Hyperlipidemia, unspecified: Secondary | ICD-10-CM

## 2019-08-24 DIAGNOSIS — R7303 Prediabetes: Secondary | ICD-10-CM | POA: Diagnosis not present

## 2019-08-24 DIAGNOSIS — R030 Elevated blood-pressure reading, without diagnosis of hypertension: Secondary | ICD-10-CM

## 2019-08-24 LAB — COMPREHENSIVE METABOLIC PANEL
ALT: 21 U/L (ref 0–35)
AST: 19 U/L (ref 0–37)
Albumin: 4.3 g/dL (ref 3.5–5.2)
Alkaline Phosphatase: 79 U/L (ref 39–117)
BUN: 14 mg/dL (ref 6–23)
CO2: 28 mEq/L (ref 19–32)
Calcium: 9.1 mg/dL (ref 8.4–10.5)
Chloride: 101 mEq/L (ref 96–112)
Creatinine, Ser: 0.78 mg/dL (ref 0.40–1.20)
GFR: 73.95 mL/min (ref >60.00)
Glucose, Bld: 98 mg/dL (ref 70–99)
Potassium: 4.1 mEq/L (ref 3.5–5.1)
Sodium: 136 mEq/L (ref 135–145)
Total Bilirubin: 0.5 mg/dL (ref 0.2–1.2)
Total Protein: 6.5 g/dL (ref 6.0–8.3)

## 2019-08-24 LAB — LIPID PANEL
Cholesterol: 220 mg/dL — ABNORMAL HIGH (ref 0–200)
HDL: 43.3 mg/dL (ref >39.00)
LDL Cholesterol: 156 mg/dL — ABNORMAL HIGH (ref 0–99)
NonHDL: 176.45
Total CHOL/HDL Ratio: 5
Triglycerides: 101 mg/dL (ref 0.0–149.0)
VLDL: 20.2 mg/dL (ref 0.0–40.0)

## 2019-08-24 LAB — VITAMIN D 25 HYDROXY (VIT D DEFICIENCY, FRACTURES): VITD: 48.57 ng/mL (ref 30.00–100.00)

## 2019-08-24 LAB — MICROALBUMIN / CREATININE URINE RATIO
Creatinine,U: 109.7 mg/dL
Microalb Creat Ratio: 0.6 mg/g (ref 0.0–30.0)
Microalb, Ur: <0.7 mg/dL (ref 0.0–1.9)

## 2019-08-24 LAB — HEMOGLOBIN A1C: Hgb A1c MFr Bld: 5.8 % (ref 4.6–6.5)

## 2019-08-24 LAB — TSH: TSH: 6.61 u[IU]/mL — ABNORMAL HIGH (ref 0.35–4.50)

## 2019-08-24 NOTE — Progress Notes (Signed)
Patient in office for labs ask if BP check could be done while in office added to NV schedule.Patient has taken BP reading X 3 back to back with home cuff reading was 153/83 on home cuff, advised this could be due to not letting arm fully decompress between pressure. I waited 12 minutes and re-evaluated BP and attained as below 140/82 pulse 78. Patient ask nurse to report BP has ben running 127/78 at home.  Patient here for nurse visit BP check per order from 07/30/19.   Patient reports compliance with prescribed BP medications: yes, No BP meds prescribed   Last dose of BP medication:   BP Readings from Last 3 Encounters:  08/24/19 140/82  07/29/19 130/76  01/19/19 140/80   Pulse Readings from Last 3 Encounters:  08/24/19 78  01/19/19 74  07/24/18 73      Patient verbalized understanding of instructions.   Kerin Salen, RN

## 2019-08-26 ENCOUNTER — Ambulatory Visit: Payer: Medicare Other

## 2019-08-31 ENCOUNTER — Ambulatory Visit: Payer: Medicare Other

## 2019-09-20 NOTE — Progress Notes (Signed)
[id number]    Provider:  Dr Jaynee Eagles Requesting Provider: Crecencio Mc, MD Primary Care Provider:  Crecencio Mc, MD  CC:  migraines  HPI:  Brianna Calhoun is a 64 y.o. female here as requested by Crecencio Mc, MD for migraine headaches.  Past medical history hyperlipidemia, prediabetes, recurrent migraine headaches.  I reviewed Dr. Lupita Dawn notes, she has been using Robaxin and tramadol.  For headache, almost daily.  She has a history of migraines.  Also chronic neck pain and other headaches as well.  She was referred to neurology for consideration of Botox injections to reduce frequency of migraines.   Patient is here for migraines. She has been to [place] [facility] for trigger point injections for her cervical muscle pain. She has a knot in the neck that gets inflamed, then she gets pain in the right shoulder and it travels up the right side of the head to behind the eyes, pulsating/pounding/throbbing, no aura, smell sensitivity is terrible if she puts a cream on it will trigger it, she has photophobia/phonophobia,nausea. She has shoulder and right-sided neck pain all the time, she has at least 15 migraine days a month that last 24-72 hours. She used to get massages. She has never had dry needling. No aura. No medication overuse. 15 moderately severe to severe migraines a month for > 1 year. No other focal neurologic deficits, associated symptoms, inciting events or modifiable factors.  Reviewed notes, labs and imaging from outside physicians, which showed:  Medications tried: Robaxin, Zofran, Maxalt, tramadol, Mobic, meclizine, prednisone,alleve, advil, fiorinol, flexeril(caused sedation), maxalt and imitrex make her feel strange. Propranolol with side effects, topiramate  Review of Systems: Patient complains of symptoms per HPI as well as the following symptoms: neck pain, muscle aches. Pertinent negatives and positives per HPI. All others negative.   Social History    Socioeconomic History  . Marital status: Married    Spouse name: Not on file  . Number of children: 4  . Years of education: Not on file  . Highest education level: Bachelor's degree (e.g., BA, AB, BS)  Occupational History  . Not on file  Tobacco Use  . Smoking status: Former Smoker    Types: Cigarettes    Quit date: 1980    Years since quitting: 41.2  . Smokeless tobacco: Never Used  Substance and Sexual Activity  . Alcohol use: Yes    Alcohol/week: 2.0 - 4.0 standard drinks    Types: 2 - 4 Glasses of wine per week  . Drug use: No  . Sexual activity: Yes  Other Topics Concern  . Not on file  Social History Narrative   Lives at home with husband    Right handed   Caffeine; 2-3 servings a day   Social Determinants of Health   Financial Resource Strain:   . Difficulty of Paying Living Expenses:   Food Insecurity:   . Worried About Charity fundraiser in the Last Year:   . Arboriculturist in the Last Year:   Transportation Needs:   . Film/video editor (Medical):   Marland Kitchen Lack of Transportation (Non-Medical):   Physical Activity:   . Days of Exercise per Week:   . Minutes of Exercise per Session:   Stress:   . Feeling of Stress :   Social Connections:   . Frequency of Communication with Friends and Family:   . Frequency of Social Gatherings with Friends and Family:   . Attends Religious Services:   .  Active Member of Clubs or Organizations:   . Attends Archivist Meetings:   Marland Kitchen Marital Status:   Intimate Partner Violence:   . Fear of Current or Ex-Partner:   . Emotionally Abused:   Marland Kitchen Physically Abused:   . Sexually Abused:     Family History  Problem Relation Age of Onset  . Breast cancer Maternal Aunt 65  . Alcohol abuse Mother   . Arthritis Mother   . Skin cancer Mother   . COPD Mother   . Depression Mother   . Diabetes Mother   . Hearing loss Mother   . Heart disease Mother   . Hyperlipidemia Mother   . Hypertension Mother   . Kidney  disease Mother   . Vision loss Mother   . Colon cancer Father   . Diabetes Father   . Hyperlipidemia Father   . Hypertension Father   . Mental illness Sister   . Hyperlipidemia Brother   . Hypertension Brother   . Diabetes Brother   . Stroke Maternal Grandfather   . Liver disease Paternal Grandfather   . Migraines Maternal Grandmother        "had them horribly"  . Migraines Daughter     Past Medical History:  Diagnosis Date  . Actinic keratosis   . Basal cell carcinoma 04/13/2008   Right temple  . Basal cell carcinoma (BCC) of face 03/08/2009   Right temple, recurrent  . Basal cell carcinoma (BCC) of face 11/28/2015   Right temple, hairline. Nodular pattern  . Basal cell carcinoma of trunk 03/29/2014   Right paraspinal upper back. Superficial   . Cancer ([place])    skin  . GERD (gastroesophageal reflux disease)   . Hyperlipidemia   . Migraine     Patient Active Problem List   Diagnosis Date Noted  . History of migraine headaches 07/30/2019  . Breast lump on left side at 7 o'clock position 01/16/2019  . Educated about COVID-19 virus infection 01/16/2019  . Statin intolerance 08/08/2017  . Encounter for preventive health examination 06/03/2017  . Prediabetes 10/16/2016  . Hyperlipidemia LDL goal <130 10/16/2016  . Anxiety state 10/16/2016  . Allergy to iodinated contrast media 10/16/2016    Past Surgical History:  Procedure Laterality Date  . CERVICAL CERCLAGE     6/84, 1/90, 8/93 under epidurals  . [place]  . CHOLECYSTECTOMY  1994  . COLONOSCOPY N/A 12/13/2014   Procedure: COLONOSCOPY;  Surgeon: Manya Silvas, MD;  Location[facility] END[facility]COPY;  Service: Endoscopy;  Laterality: N/A;  . EYE SURGERY  1998  . HERNIA REPAIR  1996   left hernia repair  . LEFT OOPHORECTOMY Left 2005   multiple complex cysts  . llq incisional hernia repair & mini-tummy tuck  07/1994  . nasal surgeries     9/87, 9/00, 11/04; deviated septum,  ethmoidectomy, rhinoplasty, rhinoplasty revision    Current Outpatient Medications  Medication Sig Dispense Refill  . Butalbital-Aspirin-Caffeine (FIORINAL PO) Take by mouth as needed (migraines).    . cycloSPORINE (RESTASIS) 0.05 % ophthalmic emulsion     . Ibuprofen (ADVIL PO) Take by mouth as needed.    . meloxicam (MOBIC) 15 MG tablet Take 15 mg by mouth daily.    . methocarbamol (ROBAXIN) 500 MG tablet Take 1 tablet (500 mg total) by mouth every 6 (six) hours as needed for muscle spasms. (Patient taking differently: Take 500 mg by mouth in the morning and at bedtime. ) 60 tablet 2  .  Naproxen Sodium (ALEVE PO) Take by mouth as needed.    . Olopatadine HCl (PATADAY OP) Apply to eye.    Marland Kitchen OVER THE COUNTER MEDICATION Vitamin B, C, D3 5,000 in/K25mcg 2-3 times per week    . Probiotic Product (PROBIOTIC PO) Take 30-50,000,000 Units by mouth at bedtime.    . rizatriptan (MAXALT-MLT) 10 MG disintegrating tablet Take 1 tablet (10 mg total) by mouth as needed for migraine. May repeat in 2 hours if needed (Patient taking differently: Take 10 mg by mouth as needed for migraine. May repeat in 2 hours if needed. Max 2/24 hours; 6/week.) 10 tablet 5  . traMADol (ULTRAM) 50 MG tablet Take 1 tablet (50 mg total) by mouth every 6 (six) hours as needed. (Patient taking differently: Take 50 mg by mouth every 8 (eight) hours as needed. ) 30 tablet 0  . UNABLE TO FIND Med Name: Allergy Drops     No current facility-administered medications for this visit.    Allergies as of 09/21/2019 - Review Complete 09/21/2019  Allergen Reaction Noted  . Amoxicillin Rash 10/15/2016  . Contrast media [iodinated diagnostic agents] Hives and Rash 11/02/2016    Vitals: BP 134/74 (BP Location: Left Arm, Patient Position: Sitting)   Pulse 74   Temp 97.7 F (36.5 C) Comment: taken at front  Ht 5\' 5"  (1.651 m)   Wt 136 lb (61.7 kg)   BMI 22.63 kg/m  Last Weight:  Wt Readings from Last 1 Encounters:  09/21/19 136 lb  (61.7 kg)   Last Height:   Ht Readings from Last 1 Encounters:  09/21/19 5\' 5"  (1.651 m)     Physical exam: Exam: Gen: NAD, conversant, well nourised, well groomed                     CV: RRR, no MRG. No Carotid Bruits. No peripheral edema, warm, nontender Eyes: Conjunctivae clear without exudates or hemorrhage  Neuro: Detailed Neurologic Exam  Speech:    Speech is normal; fluent and spontaneous with normal comprehension.  Cognition:    The patient is oriented to person, place, and time;     recent and remote memory intact;     language fluent;     normal attention, concentration,     fund of knowledge Cranial Nerves:    The pupils are equal, round, and reactive to light. Pupils too small to visualize fundi. Visual fields are full to finger confrontation. Extraocular movements are intact. Trigeminal sensation is intact and the muscles of mastication are normal. The face is symmetric. The palate elevates in the midline. Hearing intact. Voice is normal. Shoulder shrug is normal. The tongue has normal motion without fasciculations.   Coordination:    No dysmetria  Gait:    Normal native gait  Motor Observation:    No asymmetry, no atrophy, and no involuntary movements noted. Tone:    Normal muscle tone.    Posture:    Posture is normal. normal erect    Strength:    Strength is V/V in the upper and lower limbs.      Sensation: intact to LT     Reflex Exam:  DTR's:    Deep tendon reflexes in the upper and lower extremities are normal bilaterally.   Toes:    The toes are downgoing bilaterally.   Clonus:    Clonus is absent.    Assessment/Plan:  64 year old lovely female here with chronic migraines, failed multiple classes of medications, will  start botox.  Preventative: Botox Acute management: Discuss at botox appointment, the triptans have made her feel poorly, may try nurtec.  Discussed: To prevent or relieve headaches, try the following: Cool Compress. Lie  down and place a cool compress on your head.  Avoid headache triggers. If certain foods or odors seem to have triggered your migraines in the past, avoid them. A headache diary might help you identify triggers.  Include physical activity in your daily routine. Try a daily walk or other moderate aerobic exercise.  Manage stress. Find healthy ways to cope with the stressors, such as delegating tasks on your to-do list.  Practice relaxation techniques. Try deep breathing, yoga, massage and visualization.  Eat regularly. Eating regularly scheduled meals and maintaining a healthy diet might help prevent headaches. Also, drink plenty of fluids.  Follow a regular sleep schedule. Sleep deprivation might contribute to headaches Consider biofeedback. With this mind-body technique, you learn to control certain bodily functions -- such as muscle tension, heart rate and blood pressure -- to prevent headaches or reduce headache pain.    Proceed to emergency room if you experience new or worsening symptoms or symptoms do not resolve, if you have new neurologic symptoms or if headache is severe, or for any concerning symptom.   Provided education and documentation from American headache Society toolbox including articles on: chronic migraine medication overuse headache, chronic migraines, prevention of migraines, behavioral and other nonpharmacologic treatments for headache.   No orders of the defined types were placed in this encounter.  No orders of the defined types were placed in this encounter.   Cc: Crecencio Mc, MD,  Crecencio Mc, MD  Sarina Ill, MD  [facility] [address] [place],  [address]  Phone [phone] Fax [phone]  I spent 45 minutes of face-to-face and non-face-to-face time with patient on the  1. Chronic migraine without aura without status migrainosus, not intractable    diagnosis.  This included previsit chart review, lab  review, study review, order entry, electronic health record documentation, patient education on the different diagnostic and therapeutic options, counseling and coordination of care, risks and benefits of management, compliance, or risk factor reduction

## 2019-09-21 ENCOUNTER — Ambulatory Visit (INDEPENDENT_AMBULATORY_CARE_PROVIDER_SITE_OTHER): Payer: Medicare Other | Admitting: Neurology

## 2019-09-21 ENCOUNTER — Encounter: Payer: Self-pay | Admitting: Neurology

## 2019-09-21 ENCOUNTER — Other Ambulatory Visit: Payer: Self-pay

## 2019-09-21 VITALS — BP 134/74 | HR 74 | Temp 97.7°F | Ht 65.0 in | Wt 136.0 lb

## 2019-09-21 DIAGNOSIS — G43709 Chronic migraine without aura, not intractable, without status migrainosus: Secondary | ICD-10-CM | POA: Insufficient documentation

## 2019-09-21 NOTE — Patient Instructions (Signed)
OnabotulinumtoxinA injection (Medical Use) What is this medicine? ONABOTULINUMTOXINA (o na BOTT you lye num tox in eh) is a neuro-muscular blocker. This medicine is used to treat crossed eyes, eyelid spasms, severe neck muscle spasms, ankle and toe muscle spasms, and elbow, wrist, and finger muscle spasms. It is also used to treat excessive underarm sweating, to prevent chronic migraine headaches, and to treat loss of bladder control due to neurologic conditions such as multiple sclerosis or spinal cord injury. This medicine may be used for other purposes; ask your health care provider or pharmacist if you have questions. COMMON BRAND NAME(S): Botox What should I tell my health care provider before I take this medicine? They need to know if you have any of these conditions:  breathing problems  cerebral palsy spasms  difficulty urinating  heart problems  history of surgery where this medicine is going to be used  infection at the site where this medicine is going to be used  myasthenia gravis or other neurologic disease  nerve or muscle disease  surgery plans  take medicines that treat or prevent blood clots  thyroid problems  an unusual or allergic reaction to botulinum toxin, albumin, other medicines, foods, dyes, or preservatives  pregnant or trying to get pregnant  breast-feeding How should I use this medicine? This medicine is for injection into a muscle. It is given by a health care professional in a hospital or clinic setting. Talk to your pediatrician regarding the use of this medicine in children. While this drug may be prescribed for children as young as 11 years old for selected conditions, precautions do apply. Overdosage: If you think you have taken too much of this medicine contact a poison control center or emergency room at once. NOTE: This medicine is only for you. Do not share this medicine with others. What if I miss a dose? This does not apply. What may  interact with this medicine?  aminoglycoside antibiotics like gentamicin, neomycin, tobramycin  muscle relaxants  other botulinum toxin injections This list may not describe all possible interactions. Give your health care provider a list of all the medicines, herbs, non-prescription drugs, or dietary supplements you use. Also tell them if you smoke, drink alcohol, or use illegal drugs. Some items may interact with your medicine. What should I watch for while using this medicine? Visit your doctor for regular check ups. This medicine will cause weakness in the muscle where it is injected. Tell your doctor if you feel unusually weak in other muscles. Get medical help right away if you have problems with breathing, swallowing, or talking. This medicine might make your eyelids droop or make you see blurry or double. If you have weak muscles or trouble seeing do not drive a car, use machinery, or do other dangerous activities. This medicine contains albumin from human blood. It may be possible to pass an infection in this medicine, but no cases have been reported. Talk to your doctor about the risks and benefits of this medicine. If your activities have been limited by your condition, go back to your regular routine slowly after treatment with this medicine. What side effects may I notice from receiving this medicine? Side effects that you should report to your doctor or health care professional as soon as possible:  allergic reactions like skin rash, itching or hives, swelling of the face, lips, or tongue  breathing problems  changes in vision  chest pain or tightness  eye irritation, pain  fast, irregular heartbeat  infection  numbness  speech problems  swallowing problems  unusual weakness Side effects that usually do not require medical attention (report to your doctor or health care professional if they continue or are bothersome):  bruising or pain at site where  injected  drooping eyelid  dry eyes or mouth  headache  muscles aches, pains  sensitivity to light  tearing This list may not describe all possible side effects. Call your doctor for medical advice about side effects. You may report side effects to FDA at 1-800-FDA-1088. Where should I keep my medicine? This drug is given in a hospital or clinic and will not be stored at home. NOTE: This sheet is a summary. It may not cover all possible information. If you have questions about this medicine, talk to your doctor, pharmacist, or health care provider.  2020 Elsevier/Gold Standard (2017-12-30 14:21:42)  

## 2019-09-22 ENCOUNTER — Other Ambulatory Visit: Payer: Self-pay | Admitting: Neurology

## 2019-09-22 DIAGNOSIS — M7918 Myalgia, other site: Secondary | ICD-10-CM

## 2019-09-30 ENCOUNTER — Telehealth: Payer: Self-pay | Admitting: Neurology

## 2019-09-30 NOTE — Telephone Encounter (Signed)
09/30/2019 BOTOX B/B patient is medicare pull from [place]

## 2019-10-06 NOTE — Progress Notes (Signed)
Consent Form Botulism Toxin Injection For Chronic Migraine   10/07/2019: This is our first botox. She has a picture, I did not perform some of the forehead injections because she is going to a wedding soon, will do next time.   Reviewed orally with patient, additionally signature is on file:  Botulism toxin has been approved by the Federal drug administration for treatment of chronic migraine. Botulism toxin does not cure chronic migraine and it may not be effective in some patients.  The administration of botulism toxin is accomplished by injecting a small amount of toxin into the muscles of the neck and head. Dosage must be titrated for each individual. Any benefits resulting from botulism toxin tend to wear off after 3 months with a repeat injection required if benefit is to be maintained. Injections are usually done every 3-4 months with maximum effect peak achieved by about 2 or 3 weeks. Botulism toxin is expensive and you should be sure of what costs you will incur resulting from the injection.  The side effects of botulism toxin use for chronic migraine may include:   -Transient, and usually mild, facial weakness with facial injections  -Transient, and usually mild, head or neck weakness with head/neck injections  -Reduction or loss of forehead facial animation due to forehead muscle weakness  -Eyelid drooping  -Dry eye  -Pain at the site of injection or bruising at the site of injection  -Double vision  -Potential unknown long term risks  Contraindications: You should not have Botox if you are pregnant, nursing, allergic to albumin, have an infection, skin condition, or muscle weakness at the site of the injection, or have myasthenia gravis, Lambert-Eaton syndrome, or ALS.  It is also possible that as with any injection, there may be an allergic reaction or no effect from the medication. Reduced effectiveness after repeated injections is sometimes seen and rarely infection at the  injection site may occur. All care will be taken to prevent these side effects. If therapy is given over a long time, atrophy and wasting in the muscle injected may occur. Occasionally the patient's become refractory to treatment because they develop antibodies to the toxin. In this event, therapy needs to be modified.  I have read the above information and consent to the administration of botulism toxin.    BOTOX PROCEDURE NOTE FOR MIGRAINE HEADACHE    Contraindications and precautions discussed with patient(above). Aseptic procedure was observed and patient tolerated procedure. Procedure performed by Dr. Georgia Dom  The condition has existed for more than 6 months, and pt does not have a diagnosis of ALS, Myasthenia Gravis or Lambert-Eaton Syndrome.  Risks and benefits of injections discussed and pt agrees to proceed with the procedure.  Written consent obtained  These injections are medically necessary. Pt  receives good benefits from these injections. These injections do not cause sedations or hallucinations which the oral therapies may cause.  Description of procedure:  The patient was placed in a sitting position. The standard protocol was used for Botox as follows, with 5 units of Botox injected at each site:   -Procerus muscle, midline injection  -Corrugator muscle, bilateral injection  -Frontalis muscle, bilateral injection, with 2 sites each side, medial injection was performed in the upper one third of the frontalis muscle, in the region vertical from the medial inferior edge of the superior orbital rim. The lateral injection was again in the upper one third of the forehead vertically above the lateral limbus of the cornea, 1.5 cm lateral  to the medial injection site.  -Temporalis muscle injection, 4 sites, bilaterally. The first injection was 3 cm above the tragus of the ear, second injection site was 1.5 cm to 3 cm up from the first injection site in line with the tragus of  the ear. The third injection site was 1.5-3 cm forward between the first 2 injection sites. The fourth injection site was 1.5 cm posterior to the second injection site.   -Occipitalis muscle injection, 3 sites, bilaterally. The first injection was done one half way between the occipital protuberance and the tip of the mastoid process behind the ear. The second injection site was done lateral and superior to the first, 1 fingerbreadth from the first injection. The third injection site was 1 fingerbreadth superiorly and medially from the first injection site.  -Cervical paraspinal muscle injection, 2 sites, bilateral knee first injection site was 1 cm from the midline of the cervical spine, 3 cm inferior to the lower border of the occipital protuberance. The second injection site was 1.5 cm superiorly and laterally to the first injection site.  -Trapezius muscle injection was performed at 3 sites, bilaterally. The first injection site was in the upper trapezius muscle halfway between the inflection point of the neck, and the acromion. The second injection site was one half way between the acromion and the first injection site. The third injection was done between the first injection site and the inflection point of the neck.   Will return for repeat injection in 3 months.   200 units of Botox was used, any Botox not injected was wasted. The patient tolerated the procedure well, there were no complications of the above procedure.

## 2019-10-07 ENCOUNTER — Ambulatory Visit (INDEPENDENT_AMBULATORY_CARE_PROVIDER_SITE_OTHER): Payer: Medicare Other | Admitting: Neurology

## 2019-10-07 ENCOUNTER — Ambulatory Visit (INDEPENDENT_AMBULATORY_CARE_PROVIDER_SITE_OTHER): Payer: Medicare Other | Admitting: Dermatology

## 2019-10-07 ENCOUNTER — Other Ambulatory Visit: Payer: Self-pay

## 2019-10-07 VITALS — Temp 97.8°F

## 2019-10-07 DIAGNOSIS — B079 Viral wart, unspecified: Secondary | ICD-10-CM | POA: Diagnosis not present

## 2019-10-07 DIAGNOSIS — C44519 Basal cell carcinoma of skin of other part of trunk: Secondary | ICD-10-CM | POA: Diagnosis not present

## 2019-10-07 DIAGNOSIS — D1800 Hemangioma unspecified site: Secondary | ICD-10-CM

## 2019-10-07 DIAGNOSIS — G43709 Chronic migraine without aura, not intractable, without status migrainosus: Secondary | ICD-10-CM | POA: Diagnosis not present

## 2019-10-07 DIAGNOSIS — L578 Other skin changes due to chronic exposure to nonionizing radiation: Secondary | ICD-10-CM

## 2019-10-07 DIAGNOSIS — Z85828 Personal history of other malignant neoplasm of skin: Secondary | ICD-10-CM

## 2019-10-07 DIAGNOSIS — L720 Epidermal cyst: Secondary | ICD-10-CM

## 2019-10-07 DIAGNOSIS — D2230 Melanocytic nevi of unspecified part of face: Secondary | ICD-10-CM

## 2019-10-07 DIAGNOSIS — D2261 Melanocytic nevi of right upper limb, including shoulder: Secondary | ICD-10-CM | POA: Diagnosis not present

## 2019-10-07 DIAGNOSIS — L820 Inflamed seborrheic keratosis: Secondary | ICD-10-CM | POA: Diagnosis not present

## 2019-10-07 DIAGNOSIS — L814 Other melanin hyperpigmentation: Secondary | ICD-10-CM | POA: Diagnosis not present

## 2019-10-07 DIAGNOSIS — Z1283 Encounter for screening for malignant neoplasm of skin: Secondary | ICD-10-CM

## 2019-10-07 DIAGNOSIS — D229 Melanocytic nevi, unspecified: Secondary | ICD-10-CM

## 2019-10-07 DIAGNOSIS — L821 Other seborrheic keratosis: Secondary | ICD-10-CM

## 2019-10-07 DIAGNOSIS — L988 Other specified disorders of the skin and subcutaneous tissue: Secondary | ICD-10-CM

## 2019-10-07 DIAGNOSIS — D485 Neoplasm of uncertain behavior of skin: Secondary | ICD-10-CM

## 2019-10-07 DIAGNOSIS — D225 Melanocytic nevi of trunk: Secondary | ICD-10-CM | POA: Diagnosis not present

## 2019-10-07 NOTE — Progress Notes (Signed)
Follow-Up Visit   Subjective  Brianna Calhoun is a 64 y.o. female who presents for the following: Annual Exam (patient has circled areas on her body of concern. ). She presents for skin cancer screening.  She has several new spots of concern.  She has history of multiple skin cancers and Pre-Cancers treated in the past. The following portions of the chart were reviewed this encounter and updated as appropriate:     Review of Systems: No other skin or systemic complaints.  Objective  Well appearing patient in no apparent distress; mood and affect are within normal limits.  A full examination was performed including scalp, head, eyes, ears, nose, lips, neck, chest, axillae, abdomen, back, buttocks, bilateral upper extremities, bilateral lower extremities, hands, feet, fingers, toes, fingernails, and toenails. All findings within normal limits unless otherwise noted below.  Objective  Face, trunk, extremities: Diffuse scaly erythematous macules with underlying dyspigmentation.   Objective  Face: Rhytides and volume loss.   Objective  Trunk, extremities: Red papules.   Objective  numerous see history: Well healed scar with no evidence of recurrence.   Objective  Left Abdomen (side) - Upper: Scattered tan macules.   Objective  Head - Anterior (Face): Smooth white papule(s).   Objective  R chest: 0.6 cm pearly pink papule   Objective  R mid lat bicep: 0.5 cm flat red papule   Objective  Face, trunk, extremities: Tan-brown and/or pink-flesh-colored symmetric macules and papules.   Objective  Face, trunk, extremities: Stuck-on, waxy, tan-brown papule or plaque --Discussed benign etiology and prognosis.   Objective  R thumb periungual x 1: Verrucous papules -- Discussed viral etiology and contagion.   Assessment & Plan  Actinic skin damage Face, trunk, extremities  Recommend daily broad spectrum sunscreen SPF 30+ to sun-exposed areas, reapply every 2 hours as  needed. Call for new or changing lesions.   Elastosis of skin Face  Discussed in detail facial fillers. Recommend Restylane Refyne to the nasolabial creases, chin, and oral commisures.  Hemangioma, unspecified site Trunk, extremities  Benign, observe.    History of basal cell carcinoma (BCC) numerous see history  Clear. Observe for recurrence. Call clinic for new or changing lesions.  Recommend regular skin exams, daily broad-spectrum spf 30+ sunscreen use, and photoprotection.     Inflamed seborrheic keratosis (20) L glabella x 1, R ant shoulder x 1, chest and abdomen x 12, L thigh x 2, R calf x 1, R pretibial x 1, L ankle x 2  Lentigines Left Abdomen (side) - Upper  Benign, observe.    Milia Head - Anterior (Face)  Benign, observe.    Neoplasm of uncertain behavior of skin (2) R chest  Epidermal / dermal shaving  Lesion length (cm):  0.6 Lesion width (cm):  0.6 Margin per side (cm):  0.2 Total excision diameter (cm):  1 Informed consent: discussed and consent obtained   Timeout: patient name, date of birth, surgical site, and procedure verified   Procedure prep:  Patient was prepped and draped in usual sterile fashion Prep type:  Isopropyl alcohol Anesthesia: the lesion was anesthetized in a standard fashion   Anesthetic:  1% lidocaine w/ epinephrine 1-100,000 buffered w/ 8.4% NaHCO3 Instrument used: flexible razor blade   Hemostasis achieved with: pressure, aluminum chloride and electrodesiccation   Outcome: patient tolerated procedure well   Post-procedure details: sterile dressing applied and wound care instructions given   Dressing type: bandage and petrolatum    Destruction of lesion Complexity: extensive  Destruction method: electrodesiccation and curettage   Informed consent: discussed and consent obtained   Timeout:  patient name, date of birth, surgical site, and procedure verified Procedure prep:  Patient was prepped and draped in usual sterile  fashion Prep type:  Isopropyl alcohol Anesthesia: the lesion was anesthetized in a standard fashion   Anesthetic:  1% lidocaine w/ epinephrine 1-100,000 buffered w/ 8.4% NaHCO3 Curettage performed in three different directions: Yes   Electrodesiccation performed over the curetted area: Yes   Lesion length (cm):  0.6 Lesion width (cm):  0.6 Margin per side (cm):  0.2 Final wound size (cm):  1 Hemostasis achieved with:  pressure, aluminum chloride and electrodesiccation Outcome: patient tolerated procedure well with no complications   Post-procedure details: sterile dressing applied and wound care instructions given   Dressing type: bandage and petrolatum    Specimen 1 - Surgical pathology Differential Diagnosis: D48.5 r/o BCC ED&C today  Check Margins: No 0.6 cm pearly pink papule  R mid lat bicep  Epidermal / dermal shaving  Lesion length (cm):  0.5 Lesion width (cm):  0.5 Margin per side (cm):  0.2 Total excision diameter (cm):  0.9 Informed consent: discussed and consent obtained   Timeout: patient name, date of birth, surgical site, and procedure verified   Procedure prep:  Patient was prepped and draped in usual sterile fashion Prep type:  Isopropyl alcohol Anesthesia: the lesion was anesthetized in a standard fashion   Anesthetic:  1% lidocaine w/ epinephrine 1-100,000 buffered w/ 8.4% NaHCO3 Instrument used: flexible razor blade   Hemostasis achieved with: pressure, aluminum chloride and electrodesiccation   Outcome: patient tolerated procedure well   Post-procedure details: sterile dressing applied and wound care instructions given   Dressing type: bandage and petrolatum    Specimen 2 - Surgical pathology Differential Diagnosis: D48.5 irritated nevus r/o dysplasia Check Margins: No 0.5 cm flat red papule  Nevus Face, trunk, extremities  Benign, observe.    Seborrheic keratosis Face, trunk, extremities  Benign, observe.    Viral warts, unspecified type R  thumb periungual x 1  Destruction of lesion - R thumb periungual x 1 Complexity: simple   Destruction method: cryotherapy   Informed consent: discussed and consent obtained   Timeout:  patient name, date of birth, surgical site, and procedure verified Lesion destroyed using liquid nitrogen: Yes   Region frozen until ice ball extended beyond lesion: Yes   Outcome: patient tolerated procedure well with no complications   Post-procedure details: wound care instructions given    Return in about 1 year (around 10/06/2020) for TBSE.   Tanja Port, MD, am acting as scribe for Sarina Ser, MD .

## 2019-10-07 NOTE — Progress Notes (Signed)
Patient signed Botox consents  Botox- 200 units x 1 vial Lot: [id number] Expiration: 06/2022 NDC: [id number]  Bacteriostatic 0.9% Sodium Chloride- 78mL total Lot: [id number] Expiration: 10/08/2019 NDC: [id number]  Dx: [id number] B/B

## 2019-10-07 NOTE — Patient Instructions (Signed)
Shave Excision Benign Lesion Wound Care Instructions  . Leave the original bandage on for 24 hours if possible.  If the bandage becomes soaked or soiled before that time, it is OK to remove it and examine the wound.  A small amount of post-operative bleeding is normal.  If excessive bleeding occurs, remove the bandage, place gauze over the site and apply continuous pressure (no peeking) over the area for 20-30 minutes.  If this does not stop the bleeding, try again for 40 minutes.  If this does not work, please call our clinic as soon as possible (even if after-hours).    . Twice a day, cleanse the wound with soap and water.  If a thick crust develops you may use a Q-tip dipped into dilute hydrogen peroxide (mix 1:1 with water) to dissolve it.  Hydrogen peroxide can slow the healing process, so use it only as needed.  After washing, apply Vaseline jelly or Polysporin ointment.  For best healing, the wound should be covered with a layer of ointment at all times.  This may mean re-applying the ointment several times a day.  For open wounds, continue until it has healed.    . If you have any swelling, keep the area elevated.  . Some redness, tenderness and white or yellow material in the wound is normal healing.  If the area becomes very sore and red, or develops a thick yellow-green material (pus), it may be infected; please notify us.    . Wound healing continues for up to one year following surgery.  It is not unusual to experience pain in the scar from time to time during the interval.  If the pain becomes severe or the scar thickens, you should notify the office.  A slight amount of redness in a scar is expected for the first six months.  After six months, the redness subsides and the scar will soften and fade.  The color difference becomes less noticeable with time.  If there are any problems, return for a post-op surgery check at your earliest convenience.  . Please call our office for any questions  or concerns.   Electrodesiccation and Curettage ("Scrape and Burn") Wound Care Instructions  1. Leave the original bandage on for 24 hours if possible.  If the bandage becomes soaked or soiled before that time, it is OK to remove it and examine the wound.  A small amount of post-operative bleeding is normal.  If excessive bleeding occurs, remove the bandage, place gauze over the site and apply continuous pressure (no peeking) over the area for 30 minutes. If this does not work, please call our clinic as soon as possible or page your doctor if it is after hours.   2. Once a day, cleanse the wound with soap and water. It is fine to shower. If a thick crust develops you may use a Q-tip dipped into dilute hydrogen peroxide (mix 1:1 with water) to dissolve it.  Hydrogen peroxide can slow the healing process, so use it only as needed.    3. After washing, apply petroleum jelly (Vaseline) or an antibiotic ointment if your doctor prescribed one for you, followed by a bandage.    4. For best healing, the wound should be covered with a layer of ointment at all times. If you are not able to keep the area covered with a bandage to hold the ointment in place, this may mean re-applying the ointment several times a day.  Continue this wound care   until the wound has healed and is no longer open. It may take several weeks for the wound to heal and close.  Itching and mild discomfort is normal during the healing process.  If you have any discomfort, you can take Tylenol (acetaminophen) or ibuprofen as directed on the bottle. (Please do not take these if you have an allergy to them or cannot take them for another reason).  Some redness, tenderness and white or yellow material in the wound is normal healing.  If the area becomes very sore and red, or develops a thick yellow-green material (pus), it may be infected; please notify us.    Wound healing continues for up to one year following surgery. It is not unusual to  experience pain in the scar from time to time during the interval.  If the pain becomes severe or the scar thickens, you should notify the office.    A slight amount of redness in a scar is expected for the first six months.  After six months, the redness will fade and the scar will soften and fade.  The color difference becomes less noticeable with time.  If there are any problems, return for a post-op surgery check at your earliest convenience.  To improve the appearance of the scar, you can use silicone scar gel, cream, or sheets (such as Mederma or Serica) every night for up to one year. These are available over the counter (without a prescription).  Please call our office at [phone] for any questions or concerns.  

## 2019-10-09 ENCOUNTER — Ambulatory Visit
Admission: RE | Admit: 2019-10-09 | Discharge: 2019-10-09 | Disposition: A | Discharge status: Home / Self Care | Payer: Medicare Other | Source: Ambulatory Visit | Attending: General Surgery | Admitting: General Surgery

## 2019-10-09 ENCOUNTER — Other Ambulatory Visit: Payer: Self-pay | Admitting: General Surgery

## 2019-10-09 DIAGNOSIS — N6489 Other specified disorders of breast: Secondary | ICD-10-CM

## 2019-10-09 DIAGNOSIS — Z1231 Encounter for screening mammogram for malignant neoplasm of breast: Secondary | ICD-10-CM | POA: Diagnosis not present

## 2019-10-09 DIAGNOSIS — R928 Other abnormal and inconclusive findings on diagnostic imaging of breast: Secondary | ICD-10-CM

## 2019-10-09 DIAGNOSIS — Z0000 Encounter for general adult medical examination without abnormal findings: Secondary | ICD-10-CM | POA: Insufficient documentation

## 2019-10-13 ENCOUNTER — Telehealth: Payer: Self-pay

## 2019-10-13 NOTE — Telephone Encounter (Signed)
Biopsy results discussed with pt  

## 2019-10-20 ENCOUNTER — Ambulatory Visit: Payer: Self-pay | Admitting: Neurology

## 2019-10-22 ENCOUNTER — Ambulatory Visit
Admission: RE | Admit: 2019-10-22 | Discharge: 2019-10-22 | Disposition: A | Discharge status: Home / Self Care | Payer: Medicare Other | Source: Ambulatory Visit | Attending: General Surgery | Admitting: General Surgery

## 2019-10-22 ENCOUNTER — Telehealth: Payer: Self-pay | Admitting: Neurology

## 2019-10-22 DIAGNOSIS — R928 Other abnormal and inconclusive findings on diagnostic imaging of breast: Secondary | ICD-10-CM

## 2019-10-22 DIAGNOSIS — N6489 Other specified disorders of breast: Secondary | ICD-10-CM | POA: Insufficient documentation

## 2019-10-22 NOTE — Telephone Encounter (Signed)
I left a voicemail for patient to call back. She is scheduled for 2 botox appointments I just need to know which one she would like to keep and which one she wants to cancel.

## 2019-10-23 ENCOUNTER — Ambulatory Visit: Payer: Medicare Other | Admitting: Dermatology

## 2019-11-23 ENCOUNTER — Other Ambulatory Visit: Payer: Self-pay | Admitting: Internal Medicine

## 2019-12-22 ENCOUNTER — Ambulatory Visit (INDEPENDENT_AMBULATORY_CARE_PROVIDER_SITE_OTHER): Payer: Medicare Other

## 2019-12-22 VITALS — BP 115/67 | HR 64 | Ht 65.0 in | Wt 136.0 lb

## 2019-12-22 DIAGNOSIS — Z0000 Encounter for general adult medical examination without abnormal findings: Secondary | ICD-10-CM

## 2019-12-22 DIAGNOSIS — Z1159 Encounter for screening for other viral diseases: Secondary | ICD-10-CM | POA: Diagnosis not present

## 2019-12-22 NOTE — Patient Instructions (Addendum)
  Ms. Amison , Thank you for taking time to come for your Medicare Wellness Visit. I appreciate your ongoing commitment to your health goals. Please review the following plan we discussed and let me know if I can assist you in the future.   These are the goals we discussed: Goals      Patient Stated   .  Increase physical activity (pt-stated)      Weight goal 135lb       This is a list of the screening recommended for you and due dates:  Health Maintenance  Topic Date Due  .  Hepatitis C: One time screening is recommended by Center for Disease Control  (CDC) for  adults born from [birth year] through 1967.   Never done  . Pneumonia vaccines (1 of 2 - PCV13) Never done  . Flu Shot  02/07/2020  . Mammogram  10/21/2020  . Colon Cancer Screening  12/12/2024  . Tetanus Vaccine  07/24/2028  . DEXA scan (bone density measurement)  Completed  . COVID-19 Vaccine  Completed

## 2019-12-22 NOTE — Progress Notes (Addendum)
Subjective:   Brianna Calhoun is a 64 y.o. female who presents for an Initial Medicare Annual Wellness Visit.  Review of Systems    No ROS.  Medicare Wellness Virtual Visit.   Cardiac Risk Factors include: advanced age (>80men, >18 women)     Objective:    Today's Vitals   12/22/19 1242  BP: 115/67  Pulse: 64  Weight: 136 lb (61.7 kg)  Height: 5\' 5"  (1.651 m)   Body mass index is 22.63 kg/m.  Advanced Directives 12/22/2019 12/13/2014  Does Patient Have a Medical Advance Directive? Yes Yes  Type of Advance Directive [place] will  Does patient want to make changes to medical advance directive? No - Patient declined -  Copy of [place] in Chart? No - copy requested No - copy requested    Current Medications (verified) Outpatient Encounter Medications as of 12/22/2019  Medication Sig  . Butalbital-Aspirin-Caffeine (FIORINAL PO) Take by mouth as needed (migraines).  . cycloSPORINE (RESTASIS) 0.05 % ophthalmic emulsion   . Ibuprofen (ADVIL PO) Take by mouth as needed.  . meloxicam (MOBIC) 15 MG tablet Take 15 mg by mouth daily.  . methocarbamol (ROBAXIN) 500 MG tablet Take 500 mg by mouth 4 (four) times daily.  . Naproxen Sodium (ALEVE PO) Take by mouth as needed.  . Olopatadine HCl (PATADAY OP) Apply to eye.  Marland Kitchen OVER THE COUNTER MEDICATION Vitamin B, C, D3 5,000 in/K64mcg 2-3 times per week  . Probiotic Product (PROBIOTIC PO) Take 30-50,000,000 Units by mouth at bedtime.  . rizatriptan (MAXALT-MLT) 10 MG disintegrating tablet TAKE ONE TABLET AS NEEDED FOR MIGRAINE MAY REPEAT IN 2 HOURS IF NEEDED  . traMADol (ULTRAM) 50 MG tablet Take 1 tablet (50 mg total) by mouth every 6 (six) hours as needed. (Patient taking differently: Take 50 mg by mouth every 8 (eight) hours as needed. )  . UNABLE TO FIND Med Name: Allergy Drops   No facility-administered encounter medications on file as of 12/22/2019.    Allergies (verified) Amoxicillin and  Contrast media [iodinated diagnostic agents]   History: Past Medical History:  Diagnosis Date  . Actinic keratosis   . Basal cell carcinoma 04/13/2008   Right temple  . Basal cell carcinoma (BCC) of face 03/08/2009   Right temple, recurrent  . Basal cell carcinoma (BCC) of face 11/28/2015   Right temple, hairline. Nodular pattern  . Basal cell carcinoma of trunk 03/29/2014   Right paraspinal upper back. Superficial   . Cancer ([place])    skin  . GERD (gastroesophageal reflux disease)   . Hyperlipidemia   . Migraine    Past Surgical History:  Procedure Laterality Date  . CERVICAL CERCLAGE     6/84, 1/90, 8/93 under epidurals  . [place]  . CHOLECYSTECTOMY  1994  . COLONOSCOPY N/A 12/13/2014   Procedure: COLONOSCOPY;  Surgeon: Manya Silvas, MD;  Location[facility] ENDOSCOPY;  Service: Endoscopy;  Laterality: N/A;  . EYE SURGERY  1998  . HERNIA REPAIR  1996   left hernia repair  . LEFT OOPHORECTOMY Left 2005   multiple complex cysts  . llq incisional hernia repair & mini-tummy tuck  07/1994  . nasal surgeries     9/87, 9/00, 11/04; deviated septum, ethmoidectomy, rhinoplasty, rhinoplasty revision   Family History  Problem Relation Age of Onset  . Breast cancer Maternal Aunt 65  . Alcohol abuse Mother   . Arthritis Mother   . Skin  cancer Mother   . COPD Mother   . Depression Mother   . Diabetes Mother   . Hearing loss Mother   . Heart disease Mother   . Hyperlipidemia Mother   . Hypertension Mother   . Kidney disease Mother   . Vision loss Mother   . Colon cancer Father   . Diabetes Father   . Hyperlipidemia Father   . Hypertension Father   . Mental illness Sister   . Hyperlipidemia Brother   . Hypertension Brother   . Diabetes Brother   . Stroke Maternal Grandfather   . Liver disease Paternal Grandfather   . Migraines Maternal Grandmother        "had them horribly"  . Migraines Daughter    Social History   Socioeconomic  History  . Marital status: Married    Spouse name: Not on file  . Number of children: 4  . Years of education: Not on file  . Highest education level: Bachelor's degree (e.g., BA, AB, BS)  Occupational History  . Not on file  Tobacco Use  . Smoking status: Former Smoker    Types: Cigarettes    Quit date: 1980    Years since quitting: 41.4  . Smokeless tobacco: Never Used  Substance and Sexual Activity  . Alcohol use: Yes    Alcohol/week: 2.0 - 4.0 standard drinks    Types: 2 - 4 Glasses of wine per week  . Drug use: No  . Sexual activity: Yes  Other Topics Concern  . Not on file  Social History Narrative   Lives at home with husband    Right handed   Caffeine; 2-3 servings a day   Social Determinants of Health   Financial Resource Strain:   . Difficulty of Paying Living Expenses:   Food Insecurity:   . Worried About Charity fundraiser in the Last Year:   . Arboriculturist in the Last Year:   Transportation Needs:   . Film/video editor (Medical):   Marland Kitchen Lack of Transportation (Non-Medical):   Physical Activity:   . Days of Exercise per Week:   . Minutes of Exercise per Session:   Stress:   . Feeling of Stress :   Social Connections:   . Frequency of Communication with Friends and Family:   . Frequency of Social Gatherings with Friends and Family:   . Attends Religious Services:   . Active Member of Clubs or Organizations:   . Attends Archivist Meetings:   Marland Kitchen Marital Status:     Tobacco Counseling Counseling given: Not Answered   Clinical Intake:  Pre-visit preparation completed: Yes        Diabetes: No  How often do you need to have someone help you when you read instructions, pamphlets, or other written materials from your doctor or pharmacy?: 1 - Never  Interpreter Needed?: No      Activities of Daily Living In your present state of health, do you have any difficulty performing the following activities: 12/22/2019  Hearing? N   Vision? N  Difficulty concentrating or making decisions? N  Walking or climbing stairs? N  Dressing or bathing? N  Doing errands, shopping? N  Preparing Food and eating ? N  Using the Toilet? N  In the past six months, have you accidently leaked urine? N  Do you have problems with loss of bowel control? N  Managing your Medications? N  Managing your Finances? N  Housekeeping or managing  your Housekeeping? N  Some recent data might be hidden     Immunizations and Health Maintenance Immunization History  Administered Date(s) Administered  . Influenza, High Dose Seasonal PF 05/01/2019  . Influenza-Unspecified 05/02/2017, 05/16/2018  . Tdap 02/05/2010, 07/24/2018  . Zoster Recombinat (Shingrix) 01/29/2018, 04/09/2018   Health Maintenance Due  Topic Date Due  . Hepatitis C Screening  Never done  . PNA vac Low Risk Adult (1 of 2 - PCV13) Never done    Patient Care Team: Crecencio Mc, MD as PCP - General (Internal Medicine)  Indicate any recent Medical Services you may have received from other than [facility] providers in the past year (date may be approximate).     Assessment:   This is a routine wellness examination for Brianna Calhoun.  I connected with Brianna Calhoun today by telephone and verified that I am speaking with the correct person using two identifiers. Location patient: home Location provider: work Persons participating in the virtual visit: patient, Marine scientist.    I discussed the limitations, risks, security and privacy concerns of performing an evaluation and management service by telephone and the availability of in person appointments. The patient expressed understanding and verbally consented to this telephonic visit.    Interactive audio and video telecommunications were attempted between this provider and patient, however failed, due to patient having technical difficulties OR patient did not have access to video capability.  We continued and completed visit with audio  only.  Some vital signs may be absent or patient reported.   Health Maintenance Due: -PNA vaccine- discussed; to be completed with doctor in visit or local pharmacy.  -Hep C Screening- consent given  -See completed HM at the end of note.   Eye: Visual acuity not assessed. Virtual visit. Followed by their ophthalmologist.  Dental: Visits every 6 months.    Hearing: Demonstrates normal hearing during visit.  Safety:  Patient feels safe at home- yes Patient does have smoke detectors at home- yes Patient does wear sunscreen or protective clothing when in direct sunlight - yes Patient does wear seat belt when in a moving vehicle - yes Patient drives- yes Adequate lighting in walkways free from debris- yes Grab bars and handrails used as appropriate- yes Ambulates with an assistive device- no Cell phone on person when ambulating outside of the home- yes  Social: Alcohol intake - yes      Smoking history- former  Smokers in home? none Illicit drug use? none  Medication: Taking as directed and without issues.  Pill box in use -yes  Self managed - yes   Covid-19: Precautions and sickness symptoms discussed. Wears mask, social distancing, hand hygiene as appropriate.   Activities of Daily Living Patient denies needing assistance with: household chores, feeding themselves, getting from bed to chair, getting to the toilet, bathing/showering, dressing, managing money, or preparing meals.   Discussed the importance of a healthy diet, water intake and the benefits of aerobic exercise.  Physical activity-   Diet:  Regular Water: good intake Caffeine: 1-2 cups of coffee/tea  Other Providers Patient Care Team: Crecencio Mc, MD as PCP - General (Internal Medicine)  Hearing/Vision screen  Hearing Screening   125Hz  250Hz  500Hz  1000Hz  2000Hz  3000Hz  4000Hz  6000Hz  8000Hz   Right ear:           Left ear:           Comments: Patient is able to hear conversational tones without  difficulty.  No issues reported.  Vision Screening Comments: Wears corrective  lenses Visual acuity not assessed, virtual visit.  They have seen their ophthalmologist in the last 12 months.     Dietary issues and exercise activities discussed: Current Exercise Habits: Home exercise routine, Type of exercise: strength training/weights;walking, Intensity: Mild  Goals      Patient Stated   .  Increase physical activity (pt-stated)      Weight goal 135lb      Depression Screen PHQ 2/9 Scores 12/22/2019 07/29/2019 07/24/2018 03/01/2017  PHQ - 2 Score 0 0 0 0  PHQ- 9 Score - 0 0 0    Fall Risk Fall Risk  12/22/2019  Falls in the past year? 0  Number falls in past yr: 0  Follow up Falls evaluation completed    Is the patient's home free of loose throw rugs in walkways, pet beds, electrical cords, etc?         Grab bars in the bathroom?       Handrails on the stairs?         Adequate lighting?     Timed Get Up and Go Performed No, virtual visit  Cognitive Function:  Patient is alert and oriented x3. Patient denies difficulty focusing or concentrating. Patient likes to read, attends Bible Study and learning a new language for brain health.     Screening Tests Health Maintenance  Topic Date Due  . Hepatitis C Screening  Never done  . PNA vac Low Risk Adult (1 of 2 - PCV13) Never done  . INFLUENZA VACCINE  02/07/2020  . MAMMOGRAM  10/21/2020  . COLONOSCOPY  12/12/2024  . TETANUS/TDAP  07/24/2028  . DEXA SCAN  Completed  . COVID-19 Vaccine  Completed     Plan:   Keep all routine maintenance appointments.   Follow up 01/15/20 @ 8:30  Hep C Screening- consent given to future order lab  Medicare Attestation I have personally reviewed: The patient's medical and social history Their use of alcohol, tobacco or illicit drugs Their current medications and supplements The patient's functional ability including ADLs,fall risks, home safety risks, cognitive, and hearing and  visual impairment Diet and physical activities Evidence for depression   I have reviewed and discussed with patient certain preventive protocols, quality metrics, and best practice recommendations.      Calhoun, Brianna Dede L, LPN   [id number]/85/9292    I have reviewed the above information and agree with above.   Deborra Medina, MD

## 2019-12-27 LAB — HM COLONOSCOPY

## 2020-01-08 ENCOUNTER — Encounter: Payer: Self-pay | Admitting: Internal Medicine

## 2020-01-12 ENCOUNTER — Ambulatory Visit: Payer: Medicare Other | Admitting: Neurology

## 2020-01-15 ENCOUNTER — Ambulatory Visit (INDEPENDENT_AMBULATORY_CARE_PROVIDER_SITE_OTHER): Payer: Medicare Other | Admitting: Internal Medicine

## 2020-01-15 ENCOUNTER — Other Ambulatory Visit: Payer: Self-pay

## 2020-01-15 ENCOUNTER — Encounter: Payer: Self-pay | Admitting: Internal Medicine

## 2020-01-15 VITALS — BP 132/80 | HR 72 | Temp 98.0°F | Resp 14 | Ht 65.0 in | Wt 139.6 lb

## 2020-01-15 DIAGNOSIS — J00 Acute nasopharyngitis [common cold]: Secondary | ICD-10-CM

## 2020-01-15 DIAGNOSIS — R5383 Other fatigue: Secondary | ICD-10-CM

## 2020-01-15 DIAGNOSIS — Z1159 Encounter for screening for other viral diseases: Secondary | ICD-10-CM

## 2020-01-15 DIAGNOSIS — K635 Polyp of colon: Secondary | ICD-10-CM

## 2020-01-15 DIAGNOSIS — R300 Dysuria: Secondary | ICD-10-CM

## 2020-01-15 DIAGNOSIS — Z9189 Other specified personal risk factors, not elsewhere classified: Secondary | ICD-10-CM

## 2020-01-15 DIAGNOSIS — R7303 Prediabetes: Secondary | ICD-10-CM | POA: Diagnosis not present

## 2020-01-15 DIAGNOSIS — K5730 Diverticulosis of large intestine without perforation or abscess without bleeding: Secondary | ICD-10-CM | POA: Diagnosis not present

## 2020-01-15 DIAGNOSIS — R7989 Other specified abnormal findings of blood chemistry: Secondary | ICD-10-CM

## 2020-01-15 DIAGNOSIS — K648 Other hemorrhoids: Secondary | ICD-10-CM | POA: Insufficient documentation

## 2020-01-15 DIAGNOSIS — E785 Hyperlipidemia, unspecified: Secondary | ICD-10-CM

## 2020-01-15 DIAGNOSIS — L989 Disorder of the skin and subcutaneous tissue, unspecified: Secondary | ICD-10-CM

## 2020-01-15 LAB — COMPREHENSIVE METABOLIC PANEL
ALT: 19 U/L (ref 0–35)
AST: 20 U/L (ref 0–37)
Albumin: 4.6 g/dL (ref 3.5–5.2)
Alkaline Phosphatase: 72 U/L (ref 39–117)
BUN: 16 mg/dL (ref 6–23)
CO2: 28 mEq/L (ref 19–32)
Calcium: 9.5 mg/dL (ref 8.4–10.5)
Chloride: 102 mEq/L (ref 96–112)
Creatinine, Ser: 0.78 mg/dL (ref 0.40–1.20)
GFR: 73.86 mL/min (ref >60.00)
Glucose, Bld: 101 mg/dL — ABNORMAL HIGH (ref 70–99)
Potassium: 4.4 mEq/L (ref 3.5–5.1)
Sodium: 138 mEq/L (ref 135–145)
Total Bilirubin: 0.5 mg/dL (ref 0.2–1.2)
Total Protein: 6.7 g/dL (ref 6.0–8.3)

## 2020-01-15 LAB — CBC WITH DIFFERENTIAL/PLATELET
Basophils Absolute: 0.1 10*3/uL (ref 0.0–0.1)
Basophils Relative: 2.5 % (ref 0.0–3.0)
Eosinophils Absolute: 0.1 10*3/uL (ref 0.0–0.7)
Eosinophils Relative: 3 % (ref 0.0–5.0)
HCT: 43.4 % (ref 36.0–46.0)
Hemoglobin: 14.7 g/dL (ref 12.0–15.0)
Lymphocytes Relative: 25.9 % (ref 12.0–46.0)
Lymphs Abs: 1 10*3/uL (ref 0.7–4.0)
MCHC: 33.9 g/dL (ref 30.0–36.0)
MCV: 92.5 fl (ref 78.0–100.0)
Monocytes Absolute: 0.4 10*3/uL (ref 0.1–1.0)
Monocytes Relative: 8.8 % (ref 3.0–12.0)
Neutro Abs: 2.4 10*3/uL (ref 1.4–7.7)
Neutrophils Relative %: 59.8 % (ref 43.0–77.0)
Platelets: 264 10*3/uL (ref 150.0–400.0)
RBC: 4.7 Mil/uL (ref 3.87–5.11)
RDW: 12.4 % (ref 11.5–15.5)
WBC: 4.1 10*3/uL (ref 4.0–10.5)

## 2020-01-15 LAB — LIPID PANEL
Cholesterol: 247 mg/dL — ABNORMAL HIGH (ref 0–200)
HDL: 47 mg/dL (ref >39.00)
LDL Cholesterol: 166 mg/dL — ABNORMAL HIGH (ref 0–99)
NonHDL: 200.38
Total CHOL/HDL Ratio: 5
Triglycerides: 173 mg/dL — ABNORMAL HIGH (ref 0.0–149.0)
VLDL: 34.6 mg/dL (ref 0.0–40.0)

## 2020-01-15 LAB — URINALYSIS, ROUTINE W REFLEX MICROSCOPIC
Bilirubin Urine: NEGATIVE
Hgb urine dipstick: NEGATIVE
Ketones, ur: NEGATIVE
Nitrite: NEGATIVE
Specific Gravity, Urine: 1.01 (ref 1.000–1.030)
Total Protein, Urine: NEGATIVE
Urine Glucose: NEGATIVE
Urobilinogen, UA: 0.2 (ref 0.0–1.0)
pH: 6 (ref 5.0–8.0)

## 2020-01-15 LAB — TSH: TSH: 4.06 u[IU]/mL (ref 0.35–4.50)

## 2020-01-15 LAB — HEMOGLOBIN A1C: Hgb A1c MFr Bld: 5.5 % (ref 4.6–6.5)

## 2020-01-15 MED ORDER — AZITHROMYCIN 250 MG PO TABS: ORAL_TABLET | ORAL | 0 refills | Status: DC

## 2020-01-15 MED ORDER — ESTROGENS, CONJUGATED 0.625 MG/GM VA CREA: 1.0000 | TOPICAL_CREAM | Freq: Every day | VAGINAL | 12 refills | Status: DC

## 2020-01-15 NOTE — Progress Notes (Signed)
Subjective:  Patient ID: Brianna Calhoun, female    DOB: 02/08/1956  Age: 64 y.o. MRN: [id number]  CC: The primary encounter diagnosis was Prediabetes. Diagnoses of Hyperplastic polyp of large intestine, Diverticulosis of colon, Internal hemorrhoid, bleeding, Hyperlipidemia LDL goal <130, Abnormal thyroid blood test, Encounter for hepatitis C screening test for low risk patient, At increased risk of exposure to COVID-19 virus, Fatigue, unspecified type, Dysuria, Perineal irritation in female, and Acute nasopharyngitis were also pertinent to this visit.  HPI Brianna Calhoun presents for follow up on several chronic issues   This visit occurred during the SARS-CoV-2 public health emergency.  Safety protocols were in place, including screening questions prior to the visit, additional usage of staff PPE, and extensive cleaning of exam room while observing appropriate contact time as indicated for disinfecting solutions.   Scratchy throat and runny nose started 2 days ago. Husband now also now having pharyngitis.  Both have been vaccinated but have been around an unvaccinated 66 yr old pregnant daughter.   perineal irritation  Noted at the end of a urinary void, lasts only a few seconds,  But recurrent.   Hemorrhoids have been intermittently bleeding and bothersome despite banding.  Awaiting infrared procedure to be done next month      Outpatient Medications Prior to Visit  Medication Sig Dispense Refill  . acetaminophen (TYLENOL) 325 MG tablet Take 650 mg by mouth every 6 (six) hours as needed.    . Butalbital-Aspirin-Caffeine (FIORINAL PO) Take by mouth as needed (migraines).    . cycloSPORINE (RESTASIS) 0.05 % ophthalmic emulsion     . Ibuprofen (ADVIL PO) Take by mouth as needed.    . meloxicam (MOBIC) 15 MG tablet Take 15 mg by mouth daily.    . methocarbamol (ROBAXIN) 500 MG tablet Take 500 mg by mouth 4 (four) times daily.    . Naproxen Sodium (ALEVE PO) Take by mouth as needed.    .  Olopatadine HCl (PATADAY OP) Apply to eye.    Marland Kitchen OVER THE COUNTER MEDICATION Vitamin B, C, D3 5,000 in/K77mcg 2-3 times per week    . Probiotic Product (PROBIOTIC PO) Take 30-50,000,000 Units by mouth at bedtime.    . rizatriptan (MAXALT-MLT) 10 MG disintegrating tablet TAKE ONE TABLET AS NEEDED FOR MIGRAINE MAY REPEAT IN 2 HOURS IF NEEDED 10 tablet 5  . traMADol (ULTRAM) 50 MG tablet Take 1 tablet (50 mg total) by mouth every 6 (six) hours as needed. (Patient taking differently: Take 50 mg by mouth every 8 (eight) hours as needed. ) 30 tablet 0  . UNABLE TO FIND Med Name: Allergy Drops     No facility-administered medications prior to visit.    Review of Systems;  Patient denies headache, fevers, malaise, unintentional weight loss, skin rash, eye pain, sinus congestion and sinus pain, sore throat, dysphagia,  hemoptysis , cough, dyspnea, wheezing, chest pain, palpitations, orthopnea, edema, abdominal pain, nausea, melena, diarrhea, constipation, flank pain, dysuria, hematuria, urinary  Frequency, nocturia, numbness, tingling, seizures,  Focal weakness, Loss of consciousness,  Tremor, insomnia, depression, anxiety, and suicidal ideation.      Objective:  BP 132/80 (BP Location: Left Arm, Patient Position: Sitting, Cuff Size: Normal)   Pulse 72   Temp 98 F (36.7 C) (Temporal)   Resp 14   Ht 5\' 5"  (1.651 m)   Wt 139 lb 9.6 oz (63.3 kg)   SpO2 97%   BMI 23.23 kg/m   BP Readings from Last 3 Encounters:  01/15/20  132/80  12/22/19 115/67  09/21/19 134/74    Wt Readings from Last 3 Encounters:  01/15/20 139 lb 9.6 oz (63.3 kg)  12/22/19 136 lb (61.7 kg)  09/21/19 136 lb (61.7 kg)    General appearance: alert, cooperative and appears stated age Ears: normal TM's and external ear canals both ears Throat: lips, mucosa, and tongue normal; teeth and gums normal Neck: no adenopathy, no carotid bruit, supple, symmetrical, trachea midline and thyroid not enlarged, symmetric, no  tenderness/mass/nodules Back: symmetric, no curvature. ROM normal. No CVA tenderness. Lungs: clear to auscultation bilaterally Heart: regular rate and rhythm, S1, S2 normal, no murmur, click, rub or gallop Abdomen: soft, non-tender; bowel sounds normal; no masses,  no organomegaly Pulses: 2+ and symmetric Skin: Skin color, texture, turgor normal. No rashes or lesions Lymph nodes: Cervical, supraclavicular, and axillary nodes normal.  Lab Results  Component Value Date   HGBA1C 5.5 01/15/2020   HGBA1C 5.8 08/24/2019   HGBA1C 5.5 07/24/2018    Lab Results  Component Value Date   CREATININE 0.78 01/15/2020   CREATININE 0.78 08/24/2019   CREATININE 0.83 07/24/2018    Lab Results  Component Value Date   WBC 4.1 01/15/2020   HGB 14.7 01/15/2020   HCT 43.4 01/15/2020   PLT 264.0 01/15/2020   GLUCOSE 101 (H) 01/15/2020   CHOL 247 (H) 01/15/2020   TRIG 173.0 (H) 01/15/2020   HDL 47.00 01/15/2020   LDLCALC 166 (H) 01/15/2020   ALT 19 01/15/2020   AST 20 01/15/2020   NA 138 01/15/2020   K 4.4 01/15/2020   CL 102 01/15/2020   CREATININE 0.78 01/15/2020   BUN 16 01/15/2020   CO2 28 01/15/2020   TSH 4.06 01/15/2020   HGBA1C 5.5 01/15/2020   MICROALBUR <0.7 08/24/2019    MM DIAG BREAST TOMO BILATERAL  Result Date: 10/22/2019 CLINICAL DATA:  Patient recalled from screening for right breast asymmetry and left breast possible distortion. EXAM: DIGITAL DIAGNOSTIC BILATERAL MAMMOGRAM WITH CAD AND TOMO COMPARISON:  Previous exam(s). ACR Breast Density Category b: There are scattered areas of fibroglandular density. FINDINGS: Questioned asymmetries within the retroareolar right breast resolved with additional imaging compatible with dense fibroglandular tissue. Questioned distortion within the left breast resolved with additional imaging compatible with dense fibroglandular tissue. No suspicious findings on additional imaging. Mammographic images were processed with CAD. IMPRESSION: No  mammographic evidence for malignancy. RECOMMENDATION: Screening mammogram in one year.(Code:SM-B-01Y) I have discussed the findings and recommendations with the patient. If applicable, a reminder letter will be sent to the patient regarding the next appointment. BI-RADS CATEGORY  1: Negative. Electronically Signed   By: Lovey Newcomer M.D.   On: 10/22/2019 10:28    Assessment & Plan:   Problem List Items Addressed This Visit      Unprioritized   Hyperlipidemia LDL goal <130    10 yr risk is 8.5%.  lipids are  Untreated due to tatin intolerance (prior trial of Crestor in 2016 advised by Serafina Royals for 4% 10 yr risk)  And  Repatha has been denied by insurance .  No treatment needed as she has no other risk factors for CAD and no atherosclerosis was noted on 2018 CT abd and pelvis. Continue annual surveillance. Has deferred cardiac CT   Lab Results  Component Value Date   CHOL 247 (H) 01/15/2020   HDL 47.00 01/15/2020   LDLCALC 166 (H) 01/15/2020   TRIG 173.0 (H) 01/15/2020   CHOLHDL 5 01/15/2020  Relevant Orders   Lipid panel (Completed)   Hyperplastic polyp of large intestine   Diverticulosis of colon   Internal hemorrhoid, bleeding    requring repeat procedures to manage prolapse . Banding   Now awaiting infra red procedure next week       Perineal irritation in female    UTI ruled out.  Trial of vaginal estrogen       URI (upper respiratory infection)    Patient advised to get tested for COVID even though her symptoms are mild and she has been vaccinated.  Azithromycin prescribed       Relevant Medications   azithromycin (ZITHROMAX) 250 MG tablet   RESOLVED: Prediabetes - Primary    She has a history of an A1c in the past of 5.7 or 5.8,  But it has been  5.5 since Calhoun 2019   Fasting glucose is normal. continue periodic surveillance   Lab Results  Component Value Date   HGBA1C 5.5 01/15/2020         Relevant Orders   Comprehensive metabolic panel  (Completed)   Hemoglobin A1c (Completed)    Other Visit Diagnoses    Abnormal thyroid blood test       Encounter for hepatitis C screening test for low risk patient       Relevant Orders   Hepatitis C antibody   At increased risk of exposure to COVID-19 virus       Relevant Orders   SARS-CoV-2 Semi-Quantitative Total Antibody, Spike   Fatigue, unspecified type       Relevant Orders   TSH (Completed)   CBC with Differential/Platelet (Completed)   Dysuria       Relevant Orders   Urinalysis, Routine w reflex microscopic (Completed)   Urine Culture (Completed)      I am having Catelynn L. Carrigan start on azithromycin and conjugated estrogens. I am also having her maintain her cycloSPORINE, traMADol, OVER THE COUNTER MEDICATION, UNABLE TO FIND, Olopatadine HCl (PATADAY OP), Probiotic Product (PROBIOTIC PO), Naproxen Sodium (ALEVE PO), Ibuprofen (ADVIL PO), Butalbital-Aspirin-Caffeine (FIORINAL PO), meloxicam, methocarbamol, rizatriptan, and acetaminophen.  Meds ordered this encounter  Medications  . azithromycin (ZITHROMAX) 250 MG tablet    Sig: 2 tablets one day 1,  One tablet daily until gone    Dispense:  6 tablet    Refill:  0  . conjugated estrogens (PREMARIN) vaginal cream    Sig: Place 1 Applicatorful vaginally daily.    Dispense:  42.5 g    Refill:  12    There are no discontinued medications.  Follow-up: No follow-ups on file.   Crecencio Mc, MD

## 2020-01-15 NOTE — Patient Instructions (Signed)
Your vaginal pain may be due to atrophic vaginitis  Once infection is ruled out,  A trial of vaginal estrogen applied to the area that feels irritated may help Apply nightly x 2 weeks,  Then reduce use to 2/week  Get out of chlorinated water/suits asap

## 2020-01-15 NOTE — Assessment & Plan Note (Signed)
requring repeat procedures to manage prolapse . Banding   Now awaiting infra red procedure next week

## 2020-01-17 DIAGNOSIS — J069 Acute upper respiratory infection, unspecified: Secondary | ICD-10-CM | POA: Insufficient documentation

## 2020-01-17 DIAGNOSIS — L989 Disorder of the skin and subcutaneous tissue, unspecified: Secondary | ICD-10-CM | POA: Insufficient documentation

## 2020-01-17 LAB — SARS-COV-2 SEMI-QUANTITATIVE TOTAL ANTIBODY, SPIKE: SARS COV2 AB, Total Spike Semi QN: 1213 U/mL — ABNORMAL HIGH (ref <0.8)

## 2020-01-17 LAB — URINE CULTURE

## 2020-01-17 NOTE — Assessment & Plan Note (Signed)
She has a history of an A1c in the past of 5.7 or 5.8,  But it has been  5.5 since April 2019   Fasting glucose is normal. continue periodic surveillance   Lab Results  Component Value Date   HGBA1C 5.5 01/15/2020

## 2020-01-17 NOTE — Assessment & Plan Note (Signed)
UTI ruled out.  Trial of vaginal estrogen

## 2020-01-17 NOTE — Assessment & Plan Note (Signed)
Patient advised to get tested for COVID even though her symptoms are mild and she has been vaccinated.  Azithromycin prescribed

## 2020-01-17 NOTE — Assessment & Plan Note (Signed)
10 yr risk is 8.5%.  lipids are  Untreated due to tatin intolerance (prior trial of Crestor in 2016 advised by Serafina Royals for 4% 10 yr risk)  And  Repatha has been denied by insurance .  No treatment needed as she has no other risk factors for CAD and no atherosclerosis was noted on 2018 CT abd and pelvis. Continue annual surveillance. Has deferred cardiac CT   Lab Results  Component Value Date   CHOL 247 (H) 01/15/2020   HDL 47.00 01/15/2020   LDLCALC 166 (H) 01/15/2020   TRIG 173.0 (H) 01/15/2020   CHOLHDL 5 01/15/2020

## 2020-01-18 LAB — HEPATITIS C ANTIBODY
Hepatitis C Ab: NONREACTIVE
SIGNAL TO CUT-OFF: 0.01 (ref <1.00)

## 2020-01-18 NOTE — Progress Notes (Signed)
Consent Form Botulism Toxin Injection For Chronic Migraine   01/19/2020:2nd botox, did great, > 60% improvement in frequency, she has a picture  Reviewed orally with patient, additionally signature is on file:  Botulism toxin has been approved by the Federal drug administration for treatment of chronic migraine. Botulism toxin does not cure chronic migraine and it may not be effective in some patients.  The administration of botulism toxin is accomplished by injecting a small amount of toxin into the muscles of the neck and head. Dosage must be titrated for each individual. Any benefits resulting from botulism toxin tend to wear off after 3 months with a repeat injection required if benefit is to be maintained. Injections are usually done every 3-4 months with maximum effect peak achieved by about 2 or 3 weeks. Botulism toxin is expensive and you should be sure of what costs you will incur resulting from the injection.  The side effects of botulism toxin use for chronic migraine may include:   -Transient, and usually mild, facial weakness with facial injections  -Transient, and usually mild, head or neck weakness with head/neck injections  -Reduction or loss of forehead facial animation due to forehead muscle weakness  -Eyelid drooping  -Dry eye  -Pain at the site of injection or bruising at the site of injection  -Double vision  -Potential unknown long term risks  Contraindications: You should not have Botox if you are pregnant, nursing, allergic to albumin, have an infection, skin condition, or muscle weakness at the site of the injection, or have myasthenia gravis, Lambert-Eaton syndrome, or ALS.  It is also possible that as with any injection, there may be an allergic reaction or no effect from the medication. Reduced effectiveness after repeated injections is sometimes seen and rarely infection at the injection site may occur. All care will be taken to prevent these side effects. If  therapy is given over a long time, atrophy and wasting in the muscle injected may occur. Occasionally the patient's become refractory to treatment because they develop antibodies to the toxin. In this event, therapy needs to be modified.  I have read the above information and consent to the administration of botulism toxin.    BOTOX PROCEDURE NOTE FOR MIGRAINE HEADACHE    Contraindications and precautions discussed with patient(above). Aseptic procedure was observed and patient tolerated procedure. Procedure performed by Dr. Georgia Dom  The condition has existed for more than 6 months, and pt does not have a diagnosis of ALS, Myasthenia Gravis or Lambert-Eaton Syndrome.  Risks and benefits of injections discussed and pt agrees to proceed with the procedure.  Written consent obtained  These injections are medically necessary. Pt  receives good benefits from these injections. These injections do not cause sedations or hallucinations which the oral therapies may cause.  Description of procedure:  The patient was placed in a sitting position. The standard protocol was used for Botox as follows, with 5 units of Botox injected at each site:   -Procerus muscle, midline injection  -Corrugator muscle, bilateral injection  -Frontalis muscle, bilateral injection, with 2 sites each side, medial injection was performed in the upper one third of the frontalis muscle, in the region vertical from the medial inferior edge of the superior orbital rim. The lateral injection was again in the upper one third of the forehead vertically above the lateral limbus of the cornea, 1.5 cm lateral to the medial injection site.  -Temporalis muscle injection, 4 sites, bilaterally. The first injection was 3 cm above  the tragus of the ear, second injection site was 1.5 cm to 3 cm up from the first injection site in line with the tragus of the ear. The third injection site was 1.5-3 cm forward between the first 2 injection  sites. The fourth injection site was 1.5 cm posterior to the second injection site.   -Occipitalis muscle injection, 3 sites, bilaterally. The first injection was done one half way between the occipital protuberance and the tip of the mastoid process behind the ear. The second injection site was done lateral and superior to the first, 1 fingerbreadth from the first injection. The third injection site was 1 fingerbreadth superiorly and medially from the first injection site.  -Cervical paraspinal muscle injection, 2 sites, bilateral knee first injection site was 1 cm from the midline of the cervical spine, 3 cm inferior to the lower border of the occipital protuberance. The second injection site was 1.5 cm superiorly and laterally to the first injection site.  -Trapezius muscle injection was performed at 3 sites, bilaterally. The first injection site was in the upper trapezius muscle halfway between the inflection point of the neck, and the acromion. The second injection site was one half way between the acromion and the first injection site. The third injection was done between the first injection site and the inflection point of the neck.   Will return for repeat injection in 3 months.   200 units of Botox was used, any Botox not injected was wasted. The patient tolerated the procedure well, there were no complications of the above procedure.

## 2020-01-19 ENCOUNTER — Ambulatory Visit (INDEPENDENT_AMBULATORY_CARE_PROVIDER_SITE_OTHER): Payer: Medicare Other | Admitting: Neurology

## 2020-01-19 DIAGNOSIS — G43709 Chronic migraine without aura, not intractable, without status migrainosus: Secondary | ICD-10-CM | POA: Diagnosis not present

## 2020-01-19 NOTE — Progress Notes (Signed)
Botox- 200 units x 1 vial Lot: [id number] Expiration: 10/2022 NDC: 0023-3921-02  Bacteriostatic 0.9% Sodium Chloride- 4mL total Lot: [id number] Expiration: 04/08/2021 NDC: 0409-1966-02  Dx: G43.709 B/B  

## 2020-01-21 ENCOUNTER — Ambulatory Visit: Payer: Medicare Other | Admitting: Internal Medicine

## 2020-03-09 ENCOUNTER — Encounter: Payer: Self-pay | Admitting: Dermatology

## 2020-03-09 ENCOUNTER — Other Ambulatory Visit: Payer: Self-pay

## 2020-03-09 ENCOUNTER — Ambulatory Visit (INDEPENDENT_AMBULATORY_CARE_PROVIDER_SITE_OTHER): Payer: Medicare Other | Admitting: Dermatology

## 2020-03-09 DIAGNOSIS — L570 Actinic keratosis: Secondary | ICD-10-CM | POA: Diagnosis not present

## 2020-03-09 DIAGNOSIS — L820 Inflamed seborrheic keratosis: Secondary | ICD-10-CM | POA: Diagnosis not present

## 2020-03-09 DIAGNOSIS — B078 Other viral warts: Secondary | ICD-10-CM

## 2020-03-09 DIAGNOSIS — L578 Other skin changes due to chronic exposure to nonionizing radiation: Secondary | ICD-10-CM

## 2020-03-09 DIAGNOSIS — Z85828 Personal history of other malignant neoplasm of skin: Secondary | ICD-10-CM | POA: Diagnosis not present

## 2020-03-09 NOTE — Patient Instructions (Signed)

## 2020-03-09 NOTE — Progress Notes (Signed)
Follow-Up Visit   Subjective  Brianna Calhoun is a 64 y.o. female who presents for the following: Other (Scaly spots of face, back, scalp and legs.) and Warts (Right thumb - still there. Treated with LN2).  The following portions of the chart were reviewed this encounter and updated as appropriate:  Tobacco  Allergies  Meds  Problems  Med Hx  Surg Hx  Fam Hx     Review of Systems:  No other skin or systemic complaints except as noted in HPI or Assessment and Plan.  Objective  Well appearing patient in no apparent distress; mood and affect are within normal limits.  A focused examination was performed including face, scalp, chest, back. Relevant physical exam findings are noted in the Assessment and Plan.  Objective  Right Thumb periungual: Verrucous papules -- Discussed viral etiology and contagion.   Objective  Scalp x 2, face x 2, back x 1 (5): Erythematous thin papules/macules with gritty scale.   Objective  Face x 2, chest x 3, back x 4, abdomen x 5, legs x 8 (22): Erythematous keratotic or waxy stuck-on papule or plaque.    Assessment & Plan    History of Basal Cell Carcinoma of the Skin - No evidence of recurrence today - Recommend regular full body skin exams - Recommend daily broad spectrum sunscreen SPF 30+ to sun-exposed areas, reapply every 2 hours as needed.  - Call if any new or changing lesions are noted between office visits  Actinic Damage - diffuse scaly erythematous macules with underlying dyspigmentation - Recommend daily broad spectrum sunscreen SPF 30+ to sun-exposed areas, reapply every 2 hours as needed.  - Call for new or changing lesions.   Other viral warts Right Thumb periungual  3% Squaric acid applied to wart   Destruction of lesion - Right Thumb periungual Complexity: simple   Destruction method: cryotherapy   Informed consent: discussed and consent obtained   Timeout:  patient name, date of birth, surgical site, and procedure  verified Lesion destroyed using liquid nitrogen: Yes   Region frozen until ice ball extended beyond lesion: Yes   Outcome: patient tolerated procedure well with no complications   Post-procedure details: wound care instructions given    Destruction of lesion - Right Thumb periungual  Destruction method: chemical removal   Informed consent: discussed and consent obtained   Timeout:  patient name, date of birth, surgical site, and procedure verified Chemical destruction method: cantharidin   Procedure instructions: patient instructed to wash and dry area   Outcome: patient tolerated procedure well with no complications   Post-procedure details: wound care instructions given    AK (actinic keratosis) (5) Scalp x 2, face x 2, back x 1  Destruction of lesion - Scalp x 2, face x 2, back x 1 Complexity: simple   Destruction method: cryotherapy   Informed consent: discussed and consent obtained   Timeout:  patient name, date of birth, surgical site, and procedure verified Lesion destroyed using liquid nitrogen: Yes   Region frozen until ice ball extended beyond lesion: Yes   Outcome: patient tolerated procedure well with no complications   Post-procedure details: wound care instructions given    Inflamed seborrheic keratosis (22) Face x 2, chest x 3, back x 4, abdomen x 5, legs x 8  Destruction of lesion - Face x 2, chest x 3, back x 4, abdomen x 5, legs x 8 Complexity: simple   Destruction method: cryotherapy   Informed consent: discussed and  consent obtained   Timeout:  patient name, date of birth, surgical site, and procedure verified Lesion destroyed using liquid nitrogen: Yes   Region frozen until ice ball extended beyond lesion: Yes   Outcome: patient tolerated procedure well with no complications   Post-procedure details: wound care instructions given    Return for Wart follow up in 6-8 weeks and TBSE as scheduled.  I, Ashok Cordia, CMA, am acting as scribe for Sarina Ser, MD .  Documentation: I have reviewed the above documentation for accuracy and completeness, and I agree with the above.  Sarina Ser, MD

## 2020-03-14 ENCOUNTER — Encounter: Payer: Self-pay | Admitting: Dermatology

## 2020-04-19 ENCOUNTER — Ambulatory Visit: Payer: Medicare Other | Admitting: Neurology

## 2020-04-21 ENCOUNTER — Ambulatory Visit: Payer: Medicare Other | Admitting: Family Medicine

## 2020-05-01 DIAGNOSIS — K648 Other hemorrhoids: Secondary | ICD-10-CM

## 2020-05-02 ENCOUNTER — Ambulatory Visit: Payer: Medicare Other | Admitting: Dermatology

## 2020-05-03 ENCOUNTER — Ambulatory Visit (INDEPENDENT_AMBULATORY_CARE_PROVIDER_SITE_OTHER): Payer: Medicare Other | Admitting: Neurology

## 2020-05-03 DIAGNOSIS — G43709 Chronic migraine without aura, not intractable, without status migrainosus: Secondary | ICD-10-CM | POA: Diagnosis not present

## 2020-05-03 NOTE — Progress Notes (Signed)
Consent Form Botulism Toxin Injection For Chronic Migraine   01/19/2020:2nd botox, did great, > 60% improvement in frequency, she has a picture. No masseters. Some extra in the occiput and also in the levators(ask her for placement)  Reviewed orally with patient, additionally signature is on file:  Botulism toxin has been approved by the Federal drug administration for treatment of chronic migraine. Botulism toxin does not cure chronic migraine and it may not be effective in some patients.  The administration of botulism toxin is accomplished by injecting a small amount of toxin into the muscles of the neck and head. Dosage must be titrated for each individual. Any benefits resulting from botulism toxin tend to wear off after 3 months with a repeat injection required if benefit is to be maintained. Injections are usually done every 3-4 months with maximum effect peak achieved by about 2 or 3 weeks. Botulism toxin is expensive and you should be sure of what costs you will incur resulting from the injection.  The side effects of botulism toxin use for chronic migraine may include:   -Transient, and usually mild, facial weakness with facial injections  -Transient, and usually mild, head or neck weakness with head/neck injections  -Reduction or loss of forehead facial animation due to forehead muscle weakness  -Eyelid drooping  -Dry eye  -Pain at the site of injection or bruising at the site of injection  -Double vision  -Potential unknown long term risks  Contraindications: You should not have Botox if you are pregnant, nursing, allergic to albumin, have an infection, skin condition, or muscle weakness at the site of the injection, or have myasthenia gravis, Lambert-Eaton syndrome, or ALS.  It is also possible that as with any injection, there may be an allergic reaction or no effect from the medication. Reduced effectiveness after repeated injections is sometimes seen and rarely infection at  the injection site may occur. All care will be taken to prevent these side effects. If therapy is given over a long time, atrophy and wasting in the muscle injected may occur. Occasionally the patient's become refractory to treatment because they develop antibodies to the toxin. In this event, therapy needs to be modified.  I have read the above information and consent to the administration of botulism toxin.    BOTOX PROCEDURE NOTE FOR MIGRAINE HEADACHE    Contraindications and precautions discussed with patient(above). Aseptic procedure was observed and patient tolerated procedure. Procedure performed by Dr. Georgia Dom  The condition has existed for more than 6 months, and pt does not have a diagnosis of ALS, Myasthenia Gravis or Lambert-Eaton Syndrome.  Risks and benefits of injections discussed and pt agrees to proceed with the procedure.  Written consent obtained  These injections are medically necessary. Pt  receives good benefits from these injections. These injections do not cause sedations or hallucinations which the oral therapies may cause.  Description of procedure:  The patient was placed in a sitting position. The standard protocol was used for Botox as follows, with 5 units of Botox injected at each site:   -Procerus muscle, midline injection  -Corrugator muscle, bilateral injection  -Frontalis muscle, bilateral injection, with 2 sites each side, medial injection was performed in the upper one third of the frontalis muscle, in the region vertical from the medial inferior edge of the superior orbital rim. The lateral injection was again in the upper one third of the forehead vertically above the lateral limbus of the cornea, 1.5 cm lateral to the medial injection  site.  -Temporalis muscle injection, 4 sites, bilaterally. The first injection was 3 cm above the tragus of the ear, second injection site was 1.5 cm to 3 cm up from the first injection site in line with the tragus  of the ear. The third injection site was 1.5-3 cm forward between the first 2 injection sites. The fourth injection site was 1.5 cm posterior to the second injection site.   -Occipitalis muscle injection, 3 sites, bilaterally. The first injection was done one half way between the occipital protuberance and the tip of the mastoid process behind the ear. The second injection site was done lateral and superior to the first, 1 fingerbreadth from the first injection. The third injection site was 1 fingerbreadth superiorly and medially from the first injection site.  -Cervical paraspinal muscle injection, 2 sites, bilateral knee first injection site was 1 cm from the midline of the cervical spine, 3 cm inferior to the lower border of the occipital protuberance. The second injection site was 1.5 cm superiorly and laterally to the first injection site.  -Trapezius muscle injection was performed at 3 sites, bilaterally. The first injection site was in the upper trapezius muscle halfway between the inflection point of the neck, and the acromion. The second injection site was one half way between the acromion and the first injection site. The third injection was done between the first injection site and the inflection point of the neck.   Will return for repeat injection in 3 months.   200 units of Botox was used, any Botox not injected was wasted. The patient tolerated the procedure well, there were no complications of the above procedure.

## 2020-05-03 NOTE — Progress Notes (Signed)
Botox- 200 units x 1 vial Lot: [id number] Expiration: 12/2022 NDC: 0023-3921-02  Bacteriostatic 0.9% Sodium Chloride- 4mL total Lot: [id number] Expiration: 08/09/2021 NDC: 0409-1966-02  Dx: G43.709 B/B  

## 2020-07-18 ENCOUNTER — Ambulatory Visit: Payer: Medicare Other | Admitting: Internal Medicine

## 2020-08-09 ENCOUNTER — Ambulatory Visit (INDEPENDENT_AMBULATORY_CARE_PROVIDER_SITE_OTHER): Payer: Medicare Other | Admitting: Neurology

## 2020-08-09 DIAGNOSIS — G43709 Chronic migraine without aura, not intractable, without status migrainosus: Secondary | ICD-10-CM

## 2020-08-09 MED ORDER — KETOROLAC TROMETHAMINE 60 MG/2ML IM SOLN
60.0000 mg | Freq: Once | INTRAMUSCULAR | Status: AC
  Administered 2020-08-09: 60 mg via INTRAMUSCULAR

## 2020-08-09 NOTE — Progress Notes (Signed)
Botox- 200 units x 1 vial Lot: [id number] Expiration: 04/2023 NDC: 0023-3921-02  Bacteriostatic 0.9% Sodium Chloride- 4mL total Lot: [id number] Expiration: 08/09/2021 NDC: 0409-1966-02  Dx: G43.709 B/B  

## 2020-08-09 NOTE — Progress Notes (Signed)
Consent Form Botulism Toxin Injection For Chronic Migraine   08/09/2020::4th botox, did great, > 80% improvement in frequency, she has a picture. No masseters. Some extra in the occiput and also in the levators(ask her for placement) last time she marked up her shoulders and neck with the spots she wanted injected that really hurt her.   Reviewed orally with patient, additionally signature is on file:  Botulism toxin has been approved by the Federal drug administration for treatment of chronic migraine. Botulism toxin does not cure chronic migraine and it may not be effective in some patients.  The administration of botulism toxin is accomplished by injecting a small amount of toxin into the muscles of the neck and head. Dosage must be titrated for each individual. Any benefits resulting from botulism toxin tend to wear off after 3 months with a repeat injection required if benefit is to be maintained. Injections are usually done every 3-4 months with maximum effect peak achieved by about 2 or 3 weeks. Botulism toxin is expensive and you should be sure of what costs you will incur resulting from the injection.  The side effects of botulism toxin use for chronic migraine may include:   -Transient, and usually mild, facial weakness with facial injections  -Transient, and usually mild, head or neck weakness with head/neck injections  -Reduction or loss of forehead facial animation due to forehead muscle weakness  -Eyelid drooping  -Dry eye  -Pain at the site of injection or bruising at the site of injection  -Double vision  -Potential unknown long term risks  Contraindications: You should not have Botox if you are pregnant, nursing, allergic to albumin, have an infection, skin condition, or muscle weakness at the site of the injection, or have myasthenia gravis, Lambert-Eaton syndrome, or ALS.  It is also possible that as with any injection, there may be an allergic reaction or no effect from the  medication. Reduced effectiveness after repeated injections is sometimes seen and rarely infection at the injection site may occur. All care will be taken to prevent these side effects. If therapy is given over a long time, atrophy and wasting in the muscle injected may occur. Occasionally the patient's become refractory to treatment because they develop antibodies to the toxin. In this event, therapy needs to be modified.  I have read the above information and consent to the administration of botulism toxin.    BOTOX PROCEDURE NOTE FOR MIGRAINE HEADACHE    Contraindications and precautions discussed with patient(above). Aseptic procedure was observed and patient tolerated procedure. Procedure performed by Dr. Georgia Dom  The condition has existed for more than 6 months, and pt does not have a diagnosis of ALS, Myasthenia Gravis or Lambert-Eaton Syndrome.  Risks and benefits of injections discussed and pt agrees to proceed with the procedure.  Written consent obtained  These injections are medically necessary. Pt  receives good benefits from these injections. These injections do not cause sedations or hallucinations which the oral therapies may cause.  Description of procedure:  The patient was placed in a sitting position. The standard protocol was used for Botox as follows, with 5 units of Botox injected at each site:   -Procerus muscle, midline injection  -Corrugator muscle, bilateral injection  -Frontalis muscle, bilateral injection, with 2 sites each side, medial injection was performed in the upper one third of the frontalis muscle, in the region vertical from the medial inferior edge of the superior orbital rim. The lateral injection was again in the upper  one third of the forehead vertically above the lateral limbus of the cornea, 1.5 cm lateral to the medial injection site.  -Temporalis muscle injection, 4 sites, bilaterally. The first injection was 3 cm above the tragus of the  ear, second injection site was 1.5 cm to 3 cm up from the first injection site in line with the tragus of the ear. The third injection site was 1.5-3 cm forward between the first 2 injection sites. The fourth injection site was 1.5 cm posterior to the second injection site.   -Occipitalis muscle injection, 3 sites, bilaterally. The first injection was done one half way between the occipital protuberance and the tip of the mastoid process behind the ear. The second injection site was done lateral and superior to the first, 1 fingerbreadth from the first injection. The third injection site was 1 fingerbreadth superiorly and medially from the first injection site.  -Cervical paraspinal muscle injection, 2 sites, bilateral knee first injection site was 1 cm from the midline of the cervical spine, 3 cm inferior to the lower border of the occipital protuberance. The second injection site was 1.5 cm superiorly and laterally to the first injection site.  -Trapezius muscle injection was performed at 3 sites, bilaterally. The first injection site was in the upper trapezius muscle halfway between the inflection point of the neck, and the acromion. The second injection site was one half way between the acromion and the first injection site. The third injection was done between the first injection site and the inflection point of the neck.   Will return for repeat injection in 3 months.   200 units of Botox was used, any Botox not injected was wasted. The patient tolerated the procedure well, there were no complications of the above procedure.

## 2020-08-09 NOTE — Addendum Note (Signed)
Addended by: Gildardo Griffes on: 08/09/2020 02:59 PM   Modules accepted: Orders

## 2020-08-09 NOTE — Progress Notes (Signed)
Toradol 60 mg IM injection given x 1 in LUOQ of L buttock. Given per v.o. Dr Jaynee Eagles. Pt tolerated well. See MAR.

## 2020-09-09 ENCOUNTER — Other Ambulatory Visit: Payer: Self-pay | Admitting: Internal Medicine

## 2020-09-09 DIAGNOSIS — Z1231 Encounter for screening mammogram for malignant neoplasm of breast: Secondary | ICD-10-CM

## 2020-10-06 ENCOUNTER — Ambulatory Visit (INDEPENDENT_AMBULATORY_CARE_PROVIDER_SITE_OTHER): Payer: Medicare Other | Admitting: Dermatology

## 2020-10-06 ENCOUNTER — Encounter: Payer: Self-pay | Admitting: Dermatology

## 2020-10-06 ENCOUNTER — Other Ambulatory Visit: Payer: Self-pay

## 2020-10-06 DIAGNOSIS — D229 Melanocytic nevi, unspecified: Secondary | ICD-10-CM

## 2020-10-06 DIAGNOSIS — D1800 Hemangioma unspecified site: Secondary | ICD-10-CM

## 2020-10-06 DIAGNOSIS — L309 Dermatitis, unspecified: Secondary | ICD-10-CM | POA: Diagnosis not present

## 2020-10-06 DIAGNOSIS — L814 Other melanin hyperpigmentation: Secondary | ICD-10-CM

## 2020-10-06 DIAGNOSIS — L820 Inflamed seborrheic keratosis: Secondary | ICD-10-CM | POA: Diagnosis not present

## 2020-10-06 DIAGNOSIS — L578 Other skin changes due to chronic exposure to nonionizing radiation: Secondary | ICD-10-CM

## 2020-10-06 DIAGNOSIS — L570 Actinic keratosis: Secondary | ICD-10-CM

## 2020-10-06 DIAGNOSIS — Z85828 Personal history of other malignant neoplasm of skin: Secondary | ICD-10-CM | POA: Diagnosis not present

## 2020-10-06 DIAGNOSIS — Z1283 Encounter for screening for malignant neoplasm of skin: Secondary | ICD-10-CM

## 2020-10-06 DIAGNOSIS — L821 Other seborrheic keratosis: Secondary | ICD-10-CM

## 2020-10-06 MED ORDER — MOMETASONE FUROATE 0.1 % EX CREA: 1.0000 "application " | TOPICAL_CREAM | Freq: Every day | CUTANEOUS | 2 refills | Status: DC | PRN

## 2020-10-06 NOTE — Patient Instructions (Signed)

## 2020-10-06 NOTE — Progress Notes (Signed)
Follow-Up Visit   Subjective  Brianna Calhoun is a 65 y.o. female who presents for the following: Annual Exam (Total body exam today. Hx of multiple BCC. Pt has some spots all over to have checked today. ). The patient presents for Total-Body Skin Exam (TBSE) for skin cancer screening and mole check. Patient here for full body skin exam and skin cancer screening.  The following portions of the chart were reviewed this encounter and updated as appropriate:  Tobacco  Allergies  Meds  Problems  Med Hx  Surg Hx  Fam Hx     Review of Systems: No other skin or systemic complaints except as noted in HPI or Assessment and Plan.  Objective  Well appearing patient in no apparent distress; mood and affect are within normal limits.  A full examination was performed including scalp, head, eyes, ears, nose, lips, neck, chest, axillae, abdomen, back, buttocks, bilateral upper extremities, bilateral lower extremities, hands, feet, fingers, toes, fingernails, and toenails. All findings within normal limits unless otherwise noted below.  Objective  glabella x 1, forehead x 1, scalp x 1 (3): Erythematous thin papules/macules with gritty scale.   Objective  right cheek x 6, left cheek x 1, back x 3, right forearm x 1, chest  x 2, right inframammary x 1, legs x 18 (22): Erythematous keratotic or waxy stuck-on papule or plaque.   Objective  b/l hands: Scaly erythematous papules and patches +/- dyspigmentation, lichenification, excoriations.   Assessment & Plan  AK (actinic keratosis) (3) glabella x 1, forehead x 1, scalp x 1 Prior to procedure, discussed risks of blister formation, small wound, skin dyspigmentation, or rare scar following cryotherapy.   Destruction of lesion - glabella x 1, forehead x 1, scalp x 1 Complexity: simple   Destruction method: cryotherapy   Informed consent: discussed and consent obtained   Timeout:  patient name, date of birth, surgical site, and procedure  verified Lesion destroyed using liquid nitrogen: Yes   Region frozen until ice ball extended beyond lesion: Yes   Outcome: patient tolerated procedure well with no complications   Post-procedure details: wound care instructions given    Inflamed seborrheic keratosis (22) right cheek x 6, left cheek x 1, back x 3, right forearm x 1, chest  x 2, right inframammary x 1, legs x 18 Prior to procedure, discussed risks of blister formation, small wound, skin dyspigmentation, or rare scar following cryotherapy.   Destruction of lesion - right cheek x 6, left cheek x 1, back x 3, right forearm x 1, chest  x 2, right inframammary x 1, legs x 18 Complexity: simple   Destruction method: cryotherapy   Informed consent: discussed and consent obtained   Timeout:  patient name, date of birth, surgical site, and procedure verified Lesion destroyed using liquid nitrogen: Yes   Region frozen until ice ball extended beyond lesion: Yes   Outcome: patient tolerated procedure well with no complications   Post-procedure details: wound care instructions given    Hand dermatitis b/l hands Start mometasone cream. Apply daily to affected areas as needed.  Atopic dermatitis (eczema) is a chronic, relapsing, pruritic condition that can significantly affect quality of life. It is often associated with allergic rhinitis and/or asthma and can require treatment with topical medications, phototherapy, or in severe cases a biologic medication called Dupixent in older children and adults.   mometasone (ELOCON) 0.1 % cream - b/l hands  Lentigines - Scattered tan macules - Due to sun exposure -  Benign-appering, observe - Recommend daily broad spectrum sunscreen SPF 30+ to sun-exposed areas, reapply every 2 hours as needed. - Call for any changes  Seborrheic Keratoses - Stuck-on, waxy, tan-brown papules and/or plaques  - Benign-appearing - Discussed benign etiology and prognosis. - Observe - Call for any  changes  Melanocytic Nevi - Tan-brown and/or pink-flesh-colored symmetric macules and papules - Benign appearing on exam today - Observation - Call clinic for new or changing moles - Recommend daily use of broad spectrum spf 30+ sunscreen to sun-exposed areas.   Hemangiomas - Red papules - Discussed benign nature - Observe - Call for any changes  Actinic Damage - Chronic condition, secondary to cumulative UV/sun exposure - diffuse scaly erythematous macules with underlying dyspigmentation - Recommend daily broad spectrum sunscreen SPF 30+ to sun-exposed areas, reapply every 2 hours as needed.  - Staying in the shade or wearing long sleeves, sun glasses (UVA+UVB protection) and wide brim hats (4-inch brim around the entire circumference of the hat) are also recommended for sun protection.  - Call for new or changing lesions.  Skin cancer screening performed today.  History of Basal Cell Carcinoma of the Skin - No evidence of recurrence today - Recommend regular full body skin exams - Recommend daily broad spectrum sunscreen SPF 30+ to sun-exposed areas, reapply every 2 hours as needed.  - Call if any new or changing lesions are noted between office visits  Return in about 6 months (around 04/07/2021) for TBSE.   IHarriett Sine, CMA, am acting as scribe for Sarina Ser, MD.  Documentation: I have reviewed the above documentation for accuracy and completeness, and I agree with the above.  Sarina Ser, MD

## 2020-10-19 ENCOUNTER — Ambulatory Visit (INDEPENDENT_AMBULATORY_CARE_PROVIDER_SITE_OTHER): Payer: Medicare Other | Admitting: Internal Medicine

## 2020-10-19 ENCOUNTER — Other Ambulatory Visit: Payer: Self-pay

## 2020-10-19 ENCOUNTER — Encounter: Payer: Self-pay | Admitting: Internal Medicine

## 2020-10-19 VITALS — BP 144/92 | HR 76 | Temp 98.7°F | Resp 16 | Ht 65.0 in | Wt 139.0 lb

## 2020-10-19 DIAGNOSIS — R350 Frequency of micturition: Secondary | ICD-10-CM

## 2020-10-19 DIAGNOSIS — M25541 Pain in joints of right hand: Secondary | ICD-10-CM

## 2020-10-19 DIAGNOSIS — M25542 Pain in joints of left hand: Secondary | ICD-10-CM | POA: Diagnosis not present

## 2020-10-19 DIAGNOSIS — R030 Elevated blood-pressure reading, without diagnosis of hypertension: Secondary | ICD-10-CM | POA: Diagnosis not present

## 2020-10-19 DIAGNOSIS — E785 Hyperlipidemia, unspecified: Secondary | ICD-10-CM | POA: Diagnosis not present

## 2020-10-19 DIAGNOSIS — N3941 Urge incontinence: Secondary | ICD-10-CM

## 2020-10-19 DIAGNOSIS — Z23 Encounter for immunization: Secondary | ICD-10-CM

## 2020-10-19 LAB — URINALYSIS, ROUTINE W REFLEX MICROSCOPIC
Bilirubin Urine: NEGATIVE
Hgb urine dipstick: NEGATIVE
Ketones, ur: NEGATIVE
Leukocytes,Ua: NEGATIVE
Nitrite: NEGATIVE
Specific Gravity, Urine: 1.015 (ref 1.000–1.030)
Total Protein, Urine: NEGATIVE
Urine Glucose: NEGATIVE
Urobilinogen, UA: 0.2 (ref 0.0–1.0)
pH: 5.5 (ref 5.0–8.0)

## 2020-10-19 LAB — COMPREHENSIVE METABOLIC PANEL
ALT: 24 U/L (ref 0–35)
AST: 23 U/L (ref 0–37)
Albumin: 4.3 g/dL (ref 3.5–5.2)
Alkaline Phosphatase: 74 U/L (ref 39–117)
BUN: 13 mg/dL (ref 6–23)
CO2: 29 mEq/L (ref 19–32)
Calcium: 9.4 mg/dL (ref 8.4–10.5)
Chloride: 104 mEq/L (ref 96–112)
Creatinine, Ser: 0.75 mg/dL (ref 0.40–1.20)
GFR: 82.67 mL/min (ref >60.00)
Glucose, Bld: 92 mg/dL (ref 70–99)
Potassium: 4.3 mEq/L (ref 3.5–5.1)
Sodium: 139 mEq/L (ref 135–145)
Total Bilirubin: 0.7 mg/dL (ref 0.2–1.2)
Total Protein: 6.9 g/dL (ref 6.0–8.3)

## 2020-10-19 LAB — MICROALBUMIN / CREATININE URINE RATIO
Creatinine,U: 136.5 mg/dL
Microalb Creat Ratio: 0.8 mg/g (ref 0.0–30.0)
Microalb, Ur: 1 mg/dL (ref 0.0–1.9)

## 2020-10-19 LAB — LIPID PANEL
Cholesterol: 215 mg/dL — ABNORMAL HIGH (ref 0–200)
HDL: 47.1 mg/dL (ref >39.00)
LDL Cholesterol: 146 mg/dL — ABNORMAL HIGH (ref 0–99)
NonHDL: 168.19
Total CHOL/HDL Ratio: 5
Triglycerides: 110 mg/dL (ref 0.0–149.0)
VLDL: 22 mg/dL (ref 0.0–40.0)

## 2020-10-19 LAB — TSH: TSH: 2.64 u[IU]/mL (ref 0.35–4.50)

## 2020-10-19 LAB — SEDIMENTATION RATE: Sed Rate: 1 mm/hr (ref 0–30)

## 2020-10-19 NOTE — Patient Instructions (Signed)
Health Maintenance After Age 67 After age 67, you are at a higher risk for certain long-term diseases and infections as well as injuries from falls. Falls are a major cause of broken bones and head injuries in people who are older than age 67. Getting regular preventive care can help to keep you healthy and well. Preventive care includes getting regular testing and making lifestyle changes as recommended by your health care provider. Talk with your health care provider about:  Which screenings and tests you should have. A screening is a test that checks for a disease when you have no symptoms.  A diet and exercise plan that is right for you. What should I know about screenings and tests to prevent falls? Screening and testing are the best ways to find a health problem early. Early diagnosis and treatment give you the best chance of managing medical conditions that are common after age 67. Certain conditions and lifestyle choices may make you more likely to have a fall. Your health care provider may recommend:  Regular vision checks. Poor vision and conditions such as cataracts can make you more likely to have a fall. If you wear glasses, make sure to get your prescription updated if your vision changes.  Medicine review. Work with your health care provider to regularly review all of the medicines you are taking, including over-the-counter medicines. Ask your health care provider about any side effects that may make you more likely to have a fall. Tell your health care provider if any medicines that you take make you feel dizzy or sleepy.  Osteoporosis screening. Osteoporosis is a condition that causes the bones to get weaker. This can make the bones weak and cause them to break more easily.  Blood pressure screening. Blood pressure changes and medicines to control blood pressure can make you feel dizzy.  Strength and balance checks. Your health care provider may recommend certain tests to check your  strength and balance while standing, walking, or changing positions.  Foot health exam. Foot pain and numbness, as well as not wearing proper footwear, can make you more likely to have a fall.  Depression screening. You may be more likely to have a fall if you have a fear of falling, feel emotionally low, or feel unable to do activities that you used to do.  Alcohol use screening. Using too much alcohol can affect your balance and may make you more likely to have a fall. What actions can I take to lower my risk of falls? General instructions  Talk with your health care provider about your risks for falling. Tell your health care provider if: ? You fall. Be sure to tell your health care provider about all falls, even ones that seem minor. ? You feel dizzy, sleepy, or off-balance.  Take over-the-counter and prescription medicines only as told by your health care provider. These include any supplements.  Eat a healthy diet and maintain a healthy weight. A healthy diet includes low-fat dairy products, low-fat (lean) meats, and fiber from whole grains, beans, and lots of fruits and vegetables. Home safety  Remove any tripping hazards, such as rugs, cords, and clutter.  Install safety equipment such as grab bars in bathrooms and safety rails on stairs.  Keep rooms and walkways well-lit. Activity  Follow a regular exercise program to stay fit. This will help you maintain your balance. Ask your health care provider what types of exercise are appropriate for you.  If you need a cane or walker,   use it as recommended by your health care provider.  Wear supportive shoes that have nonskid soles.   Lifestyle  Do not drink alcohol if your health care provider tells you not to drink.  If you drink alcohol, limit how much you have: ? 0-1 drink a day for women. ? 0-2 drinks a day for men.  Be aware of how much alcohol is in your drink. In the [place] one drink equals one typical bottle of beer (12  oz), one-half glass of wine (5 oz), or one shot of hard liquor (1 oz).  Do not use any products that contain nicotine or tobacco, such as cigarettes and e-cigarettes. If you need help quitting, ask your health care provider. Summary  Having a healthy lifestyle and getting preventive care can help to protect your health and wellness after age 67.  Screening and testing are the best way to find a health problem early and help you avoid having a fall. Early diagnosis and treatment give you the best chance for managing medical conditions that are more common for people who are older than age 67.  Falls are a major cause of broken bones and head injuries in people who are older than age 67. Take precautions to prevent a fall at home.  Work with your health care provider to learn what changes you can make to improve your health and wellness and to prevent falls. This information is not intended to replace advice given to you by your health care provider. Make sure you discuss any questions you have with your health care provider. Document Revised: 10/16/2018 Document Reviewed: 05/08/2017 Elsevier Patient Education  2021 [employer]vier Inc.  

## 2020-10-19 NOTE — Progress Notes (Signed)
Patient ID: Brianna Calhoun, female    DOB: [birth year]  Age: 65 y.o. MR N: [id number]  The patient is here for follow up and management of other chronic and acute problems.  This visit occurred during the SARS-CoV-2 public health emergency.  Safety protocols were in place, including screening questions prior to the visit, additional usage of staff PPE, and extensive cleaning of exam room while observing appropriate contact time as indicated for disinfecting solutions.      The risk factors are reflected in the social history.  The roster of all physicians providing medical care to patient - is listed in the Snapshot section of the chart.  Activities of daily living:  The patient is 100% independent in all ADLs: dressing, toileting, feeding as well as independent mobility  Home safety : The patient has smoke detectors in the home. They wear seatbelts.  There are no firearms at home. There is no violence in the home.   There is no risks for hepatitis, STDs or HIV. There is no   history of blood transfusion. They have no travel history to infectious disease endemic areas of the world.  The patient has seen their dentist in the last six month. They have seen their eye doctor in the last year. Brianna Calhoun denies  difficulty hearing with regard to whispered voices and some television programs.  They have deferred audiologic testing in the last year.  They do not  have excessive sun exposure. Discussed the need for sun protection: hats, long sleeves and use of sunscreen if there is significant sun exposure.   Diet: the importance of a healthy diet is discussed. They do have a healthy diet.  The benefits of regular aerobic exercise were discussed. Brianna Calhoun  Exercises 5 days per week  60  minutes.   Depression screen: there are no signs or vegative symptoms of depression- irritability, change in appetite, anhedonia, sadness/tearfullness.  Cognitive assessment: the patient manages all their financial and personal  affairs and is actively engaged. They could relate day,date,year and events; recalled 2/3 objects at 3 minutes; performed clock-face test normally.  The following portions of the patient's history were reviewed and updated as appropriate: allergies, current medications, past family history, past medical history,  past surgical history, past social history  and problem list.  Visual acuity was not assessed per patient preference since Brianna Calhoun has regular follow up with her ophthalmologist. Hearing and body mass index were assessed and reviewed.   During the course of the visit the patient was educated and counseled about appropriate screening and preventive services including : fall prevention , diabetes screening, nutrition counseling, colorectal cancer screening, and recommended immunizations.    CC: The primary encounter diagnosis was COVID-19 vaccine administered. Diagnoses of Urinary frequency, Hyperlipidemia LDL goal <130, Urge incontinence, Arthralgia of both hands, and Elevated blood pressure reading in office with white coat syndrome, without diagnosis of hypertension were also pertinent to this visit.  1)  Elevated blood pressure: home readings have been done regularly and range from 245 to 809 [id number] to 78 diastolic   2) COVID Booster received in September  No infections ,. Wondering whether  Brianna Calhoun should get a 4th  3)   Hyperlipidemia: wants cardiac CT and consult with End or [facility]   4) hemorrhoids,internal , was banded .had rectal cramps, severe for 30 mintues, had some scant bleeding for several weeks.   5) urinary urgency , no other symptoms , only in the morning   6) knuckles and knees  aching,  History of positive ANA  7) received a pneumonia vaccine("23?") [facility] several months ago. Told it was the "one and done"    History Brianna Calhoun has a past medical history of Actinic keratosis, Basal cell carcinoma (04/13/2008), Basal cell carcinoma (10/07/2019), Basal cell carcinoma  (BCC) of face (03/08/2009), Basal cell carcinoma (BCC) of face (11/28/2015), Basal cell carcinoma of trunk (03/29/2014), Cancer ([place]), GERD (gastroesophageal reflux disease), Hyperlipidemia, and Migraine.   Brianna Calhoun has a past surgical history that includes Colonoscopy (N/A, 12/13/2014); Cholecystectomy (1994); Cesarean section (1984, 1986, [place]); Eye surgery (1998); Hernia repair (1996); Left oophorectomy (Left, 2005); Cervical cerclage; llq incisional hernia repair & mini-tummy tuck (07/1994); and nasal surgeries.   Her family history includes Alcohol abuse in her mother; Arthritis in her mother; Breast cancer (age of onset: 15) in her maternal aunt; COPD in her mother; Colon cancer in her father; Depression in her mother; Diabetes in her brother, father, and mother; Hearing loss in her mother; Heart disease in her mother; Hyperlipidemia in her brother, father, and mother; Hypertension in her brother, father, and mother; Kidney disease in her mother; Liver disease in her paternal grandfather; Mental illness in her sister; Migraines in her daughter and maternal grandmother; Skin cancer in her mother; Stroke in her maternal grandfather; Vision loss in her mother.Brianna Calhoun reports that Brianna Calhoun quit smoking about 42 years ago. Her smoking use included cigarettes. Brianna Calhoun has never used smokeless tobacco. Brianna Calhoun reports current alcohol use of about 2.0 - 4.0 standard drinks of alcohol per week. Brianna Calhoun reports that Brianna Calhoun does not use drugs.  Outpatient Medications Prior to Visit  Medication Sig Dispense Refill  . acetaminophen (TYLENOL) 325 MG tablet Take 650 mg by mouth every 6 (six) hours as needed.    . conjugated estrogens (PREMARIN) vaginal cream Place 1 Applicatorful vaginally daily. 42.5 g 12  . cycloSPORINE (RESTASIS) 0.05 % ophthalmic emulsion     . Ibuprofen (ADVIL PO) Take by mouth as needed.    . meloxicam (MOBIC) 15 MG tablet Take 15 mg by mouth daily.    . methocarbamol (ROBAXIN) 500 MG tablet Take 500 mg by mouth 4  (four) times daily.    . mometasone (ELOCON) 0.1 % cream Apply 1 application topically daily as needed (Rash). 45 g 2  . Naproxen Sodium (ALEVE PO) Take by mouth as needed.    . Olopatadine HCl (PATADAY OP) Apply to eye.    Marland Kitchen OVER THE COUNTER MEDICATION Vitamin B, C, D3 5,000 in/K18mcg 2-3 times per week    . Probiotic Product (PROBIOTIC PO) Take 30-50,000,000 Units by mouth at bedtime.    . rizatriptan (MAXALT-MLT) 10 MG disintegrating tablet TAKE ONE TABLET AS NEEDED FOR MIGRAINE MAY REPEAT IN 2 HOURS IF NEEDED 10 tablet 5  . traMADol (ULTRAM) 50 MG tablet Take 1 tablet (50 mg total) by mouth every 6 (six) hours as needed. (Patient taking differently: Take 50 mg by mouth every 8 (eight) hours as needed.) 30 tablet 0  . UNABLE TO FIND Med Name: Allergy Drops     No facility-administered medications prior to visit.    Review of Systems   Patient denies headache, fevers, malaise, unintentional weight loss, skin rash, eye pain, sinus congestion and sinus pain, sore throat, dysphagia,  hemoptysis , cough, dyspnea, wheezing, chest pain, palpitations, orthopnea, edema, abdominal pain, nausea, melena, diarrhea, constipation, flank pain, dysuria, hematuria, urinary  Frequency, nocturia, numbness, tingling, seizures,  Focal weakness, Loss of consciousness,  Tremor, insomnia, depression, anxiety, and suicidal  ideation.      Objective:  BP (!) 144/92 (BP Location: Left Arm, Patient Position: Sitting, Cuff Size: Normal)   Pulse 76   Temp 98.7 F (37.1 C) (Oral)   Resp 16   Ht 5\' 5"  (1.651 m)   Wt 139 lb (63 kg)   SpO2 97%   BMI 23.13 kg/m   Physical Exam  General appearance: alert, cooperative and appears stated age Head: Normocephalic, without obvious abnormality, atraumatic Eyes: conjunctivae/corneas clear. PERRL, EOM's intact. Fundi benign. Ears: normal TM's and external ear canals both ears Nose: Nares normal. Septum midline. Mucosa normal. No drainage or sinus tenderness. Throat: lips,  mucosa, and tongue normal; teeth and gums normal Neck: no adenopathy, no carotid bruit, no JVD, supple, symmetrical, trachea midline and thyroid not enlarged, symmetric, no tenderness/mass/nodules Lungs: clear to auscultation bilaterally Breasts: normal appearance, no masses or tenderness Heart: regular rate and rhythm, S1, S2 normal, no murmur, click, rub or gallop Abdomen: soft, non-tender; bowel sounds normal; no masses,  no organomegaly Extremities: extremities normal, atraumatic, no cyanosis or edema Pulses: 2+ and symmetric Skin: Skin color, texture, turgor normal. No rashes or lesions Neurologic: Alert and oriented X 3, normal strength and tone. Normal symmetric reflexes. Normal coordination and gait.    Assessment & Plan:   Problem List Items Addressed This Visit      Unprioritized   Arthralgia of both hands    Brianna Calhoun has no signs of synovitis or autoimmune inflammatory state.   Lab Results  Component Value Date   ESRSEDRATE 1 10/19/2020   Lab Results  Component Value Date   ANA NEGATIVE 10/19/2020         Relevant Orders   Sedimentation rate (Completed)   ANA (Completed)   Elevated blood pressure reading in office with white coat syndrome, without diagnosis of hypertension    Brianna Calhoun has no history of hypertension but has had several elevated readings.  Brianna Calhoun has been asked to check her pressures at home and submit readings for evaluation. Renal function will be checked today  Lab Results  Component Value Date   CREATININE 0.75 10/19/2020   Lab Results  Component Value Date   NA 139 10/19/2020   K 4.3 10/19/2020   CL 104 10/19/2020   CO2 29 10/19/2020         Relevant Orders   Microalbumin / creatinine urine ratio (Completed)   Hyperlipidemia LDL goal <130    10 yr risk is 8.5%.  lipids are  Untreated due to statin intolerance (prior trial of Crestor in 2016 advised by Serafina Royals for 4% 10 yr risk)  And  Repatha has been denied by insurance .  No treatment  needed as Brianna Calhoun has no other risk factors for CAD and no atherosclerosis was noted on 2018 CT abd and pelvis. . Has deferred cardiac CT  In the past but now willing to undergo evaluation.   Lab Results  Component Value Date   CHOL 215 (H) 10/19/2020   HDL 47.10 10/19/2020   LDLCALC 146 (H) 10/19/2020   TRIG 110.0 10/19/2020   CHOLHDL 5 10/19/2020          Relevant Orders   Lipid panel (Completed)   Comprehensive metabolic panel (Completed)   TSH (Completed)   Ambulatory referral to Cardiology   Urge incontinence    Brianna Calhoun is requesting pelvic floor muscle PT      Relevant Orders   Ambulatory referral to Physical Therapy   Urinary frequency    There  is no evidence of UTI by culture results.        Relevant Orders   Urinalysis, Routine w reflex microscopic (Completed)   Urine Culture (Completed)   Ambulatory referral to Physical Therapy    Other Visit Diagnoses    COVID-19 vaccine administered    -  Primary   Relevant Orders   SARS-CoV-2 Semi-Quantitative Total Antibody, Spike      I am having Armelia L. Greff maintain her cycloSPORINE, traMADol, OVER THE COUNTER MEDICATION, UNABLE TO FIND, Olopatadine HCl (PATADAY OP), Probiotic Product (PROBIOTIC PO), Naproxen Sodium (ALEVE PO), Ibuprofen (ADVIL PO), meloxicam, methocarbamol, rizatriptan, acetaminophen, conjugated estrogens, and mometasone.  No orders of the defined types were placed in this encounter.   There are no discontinued medications.  Follow-up: Return in about 1 year (around 10/19/2021).   Crecencio Mc, MD

## 2020-10-20 LAB — URINE CULTURE
MICRO NUMBER:: 11765650
SPECIMEN QUALITY:: ADEQUATE

## 2020-10-21 DIAGNOSIS — R350 Frequency of micturition: Secondary | ICD-10-CM | POA: Insufficient documentation

## 2020-10-21 DIAGNOSIS — M25541 Pain in joints of right hand: Secondary | ICD-10-CM | POA: Insufficient documentation

## 2020-10-21 DIAGNOSIS — N3941 Urge incontinence: Secondary | ICD-10-CM | POA: Insufficient documentation

## 2020-10-21 DIAGNOSIS — R030 Elevated blood-pressure reading, without diagnosis of hypertension: Secondary | ICD-10-CM | POA: Insufficient documentation

## 2020-10-21 LAB — ANA: Anti Nuclear Antibody (ANA): NEGATIVE

## 2020-10-21 NOTE — Assessment & Plan Note (Signed)
She is requesting pelvic floor muscle PT

## 2020-10-21 NOTE — Assessment & Plan Note (Signed)
10 yr risk is 8.5%.  lipids are  Untreated due to statin intolerance (prior trial of Crestor in 2016 advised by Serafina Royals for 4% 10 yr risk)  And  Repatha has been denied by insurance .  No treatment needed as she has no other risk factors for CAD and no atherosclerosis was noted on 2018 CT abd and pelvis. . Has deferred cardiac CT  In the past but now willing to undergo evaluation.   Lab Results  Component Value Date   CHOL 215 (H) 10/19/2020   HDL 47.10 10/19/2020   LDLCALC 146 (H) 10/19/2020   TRIG 110.0 10/19/2020   CHOLHDL 5 10/19/2020

## 2020-10-21 NOTE — Assessment & Plan Note (Signed)
She has no history of hypertension but has had several elevated readings.  She has been asked to check her pressures at home and submit readings for evaluation. Renal function will be checked today  Lab Results  Component Value Date   CREATININE 0.75 10/19/2020   Lab Results  Component Value Date   NA 139 10/19/2020   K 4.3 10/19/2020   CL 104 10/19/2020   CO2 29 10/19/2020

## 2020-10-21 NOTE — Assessment & Plan Note (Signed)
There is no evidence of UTI by culture results.   

## 2020-10-21 NOTE — Assessment & Plan Note (Signed)
She has no signs of synovitis or autoimmune inflammatory state.   Lab Results  Component Value Date   ESRSEDRATE 1 10/19/2020   Lab Results  Component Value Date   ANA NEGATIVE 10/19/2020

## 2020-10-24 ENCOUNTER — Ambulatory Visit
Admission: RE | Admit: 2020-10-24 | Discharge: 2020-10-24 | Disposition: A | Discharge status: Home / Self Care | Payer: Medicare Other | Source: Ambulatory Visit | Attending: Internal Medicine | Admitting: Internal Medicine

## 2020-10-24 ENCOUNTER — Other Ambulatory Visit: Payer: Self-pay

## 2020-10-24 DIAGNOSIS — Z1231 Encounter for screening mammogram for malignant neoplasm of breast: Secondary | ICD-10-CM | POA: Diagnosis present

## 2020-10-25 LAB — SARS-COV-2 SEMI-QUANTITATIVE TOTAL ANTIBODY, SPIKE: SARS COV2 AB, Total Spike Semi QN: >2500 U/mL — ABNORMAL HIGH (ref <0.8)

## 2020-11-08 ENCOUNTER — Ambulatory Visit: Payer: Self-pay | Admitting: Neurology

## 2020-11-10 ENCOUNTER — Ambulatory Visit (INDEPENDENT_AMBULATORY_CARE_PROVIDER_SITE_OTHER): Payer: Medicare Other | Admitting: Neurology

## 2020-11-10 DIAGNOSIS — G43709 Chronic migraine without aura, not intractable, without status migrainosus: Secondary | ICD-10-CM | POA: Diagnosis not present

## 2020-11-10 NOTE — Progress Notes (Signed)
Consent Form Botulism Toxin Injection For Chronic Migraine   Interval history 11/10/2020: Stable  08/09/2020::4th botox, did great, > 80% improvement in frequency, she has a picture. No masseters. Some extra in the occiput and also in the levators(ask her for placement) last time she marked up her shoulders and neck with the spots she wanted injected that really hurt her.   Reviewed orally with patient, additionally signature is on file:  Botulism toxin has been approved by the Federal drug administration for treatment of chronic migraine. Botulism toxin does not cure chronic migraine and it may not be effective in some patients.  The administration of botulism toxin is accomplished by injecting a small amount of toxin into the muscles of the neck and head. Dosage must be titrated for each individual. Any benefits resulting from botulism toxin tend to wear off after 3 months with a repeat injection required if benefit is to be maintained. Injections are usually done every 3-4 months with maximum effect peak achieved by about 2 or 3 weeks. Botulism toxin is expensive and you should be sure of what costs you will incur resulting from the injection.  The side effects of botulism toxin use for chronic migraine may include:   -Transient, and usually mild, facial weakness with facial injections  -Transient, and usually mild, head or neck weakness with head/neck injections  -Reduction or loss of forehead facial animation due to forehead muscle weakness  -Eyelid drooping  -Dry eye  -Pain at the site of injection or bruising at the site of injection  -Double vision  -Potential unknown long term risks  Contraindications: You should not have Botox if you are pregnant, nursing, allergic to albumin, have an infection, skin condition, or muscle weakness at the site of the injection, or have myasthenia gravis, Lambert-Eaton syndrome, or ALS.  It is also possible that as with any injection, there may be an  allergic reaction or no effect from the medication. Reduced effectiveness after repeated injections is sometimes seen and rarely infection at the injection site may occur. All care will be taken to prevent these side effects. If therapy is given over a long time, atrophy and wasting in the muscle injected may occur. Occasionally the patient's become refractory to treatment because they develop antibodies to the toxin. In this event, therapy needs to be modified.  I have read the above information and consent to the administration of botulism toxin.    BOTOX PROCEDURE NOTE FOR MIGRAINE HEADACHE    Contraindications and precautions discussed with patient(above). Aseptic procedure was observed and patient tolerated procedure. Procedure performed by Dr. Georgia Dom  The condition has existed for more than 6 months, and pt does not have a diagnosis of ALS, Myasthenia Gravis or Lambert-Eaton Syndrome.  Risks and benefits of injections discussed and pt agrees to proceed with the procedure.  Written consent obtained  These injections are medically necessary. Pt  receives good benefits from these injections. These injections do not cause sedations or hallucinations which the oral therapies may cause.  Description of procedure:  The patient was placed in a sitting position. The standard protocol was used for Botox as follows, with 5 units of Botox injected at each site:   -Procerus muscle, midline injection  -Corrugator muscle, bilateral injection  -Frontalis muscle, bilateral injection, with 2 sites each side, medial injection was performed in the upper one third of the frontalis muscle, in the region vertical from the medial inferior edge of the superior orbital rim. The lateral injection  was again in the upper one third of the forehead vertically above the lateral limbus of the cornea, 1.5 cm lateral to the medial injection site.  -Temporalis muscle injection, 4 sites, bilaterally. The first  injection was 3 cm above the tragus of the ear, second injection site was 1.5 cm to 3 cm up from the first injection site in line with the tragus of the ear. The third injection site was 1.5-3 cm forward between the first 2 injection sites. The fourth injection site was 1.5 cm posterior to the second injection site.   -Occipitalis muscle injection, 3 sites, bilaterally. The first injection was done one half way between the occipital protuberance and the tip of the mastoid process behind the ear. The second injection site was done lateral and superior to the first, 1 fingerbreadth from the first injection. The third injection site was 1 fingerbreadth superiorly and medially from the first injection site.  -Cervical paraspinal muscle injection, 2 sites, bilateral knee first injection site was 1 cm from the midline of the cervical spine, 3 cm inferior to the lower border of the occipital protuberance. The second injection site was 1.5 cm superiorly and laterally to the first injection site.  -Trapezius muscle injection was performed at 3 sites, bilaterally. The first injection site was in the upper trapezius muscle halfway between the inflection point of the neck, and the acromion. The second injection site was one half way between the acromion and the first injection site. The third injection was done between the first injection site and the inflection point of the neck.   Will return for repeat injection in 3 months.   200 units of Botox was used, any Botox not injected was wasted. The patient tolerated the procedure well, there were no complications of the above procedure.

## 2020-11-10 NOTE — Progress Notes (Signed)
Botox- 200 units x 1 vial Lot: [id number] Expiration: 07/2023 NDC: 0023-3921-02  Bacteriostatic 0.9% Sodium Chloride- 4mL total Lot: [id number] Expiration: 12/07/2021 NDC: 0409-1966-02  Dx: G43.709 B/B  

## 2020-11-15 ENCOUNTER — Ambulatory Visit (INDEPENDENT_AMBULATORY_CARE_PROVIDER_SITE_OTHER): Payer: Medicare Other | Admitting: Cardiovascular Disease

## 2020-11-15 ENCOUNTER — Encounter: Payer: Self-pay | Admitting: Cardiovascular Disease

## 2020-11-15 ENCOUNTER — Other Ambulatory Visit: Payer: Self-pay

## 2020-11-15 VITALS — BP 130/84 | HR 66 | Ht 65.0 in | Wt 141.2 lb

## 2020-11-15 DIAGNOSIS — E785 Hyperlipidemia, unspecified: Secondary | ICD-10-CM

## 2020-11-15 DIAGNOSIS — Z789 Other specified health status: Secondary | ICD-10-CM | POA: Diagnosis not present

## 2020-11-15 DIAGNOSIS — R030 Elevated blood-pressure reading, without diagnosis of hypertension: Secondary | ICD-10-CM

## 2020-11-15 DIAGNOSIS — Z83438 Family history of other disorder of lipoprotein metabolism and other lipidemia: Secondary | ICD-10-CM | POA: Diagnosis not present

## 2020-11-15 DIAGNOSIS — R9431 Abnormal electrocardiogram [ECG] [EKG]: Secondary | ICD-10-CM

## 2020-11-15 NOTE — Patient Instructions (Addendum)
Medication Instructions:  Please continue your current medications   If you need a refill on your cardiac medications before your next appointment, please call your pharmacy.    Lab work: No new labs needed  Testing/Procedures: Carotid ultrasound (screening due to family hx)  We have order a CT coronary calcium score for family hx $99 out of pocket expense  at our [facility] in [place]  This procedure uses special x-ray equipment to produce pictures of the coronary arteries to determine if they are blocked or narrowed by the buildup of plaque - an indicator for atherosclerosis or coronary artery disease (CAD).  Please call [phone] to schedule at your earliest convince   [address] [place], [place] [address]   Follow-Up: At [facility], you and your health needs are our priority.  As part of our continuing mission to provide you with exceptional heart care, we have created designated Provider Care Teams.  These Care Teams include your primary Cardiologist (physician) and Advanced Practice Providers (APPs -  Physician Assistants and Nurse Practitioners) who all work together to provide you with the care you need, when you need it.  . You will need a follow up appointment as needed  . Providers on your designated Care Team:   . Murray Hodgkins, NP . Christell Faith, PA-C . Marrianne Mood, PA-C  COVID-19 Vaccine Information can be found at: [email or web address] For questions related to vaccine distribution or appointments, please email vaccine@ [email or web address] or call [phone].

## 2020-11-15 NOTE — Addendum Note (Signed)
Addended by: Wynema Birch on: 11/15/2020 09:22 AM   Modules accepted: Orders

## 2020-11-15 NOTE — Progress Notes (Signed)
Cardiology Office Note  Date:  11/15/2020   ID:  Brianna Calhoun, DOB 1956-02-28, MRN [id number]  PCP:  Crecencio Mc, MD   Chief Complaint  Patient presents with  . New Patient (Initial Visit)    Establish care for hyperlipidemia and a family history of CAD. "doing well." Medications reviewed by the patient verbally.     HPI:  Brianna Calhoun is a 65 year old woman with past medical history of Hyperlipidemia, statin intolerance Smoker quit 40 years ago, smoked for 5 yr Who presents by consultation for evaluation of her hyperlipidemia and cardiac risk factors  Elevated blood pressure on last visit with primary care October 19, 2020 Improved blood pressure on today's visit 130/80 Active at baseline, exercises, tries to watch her diet In general has been very healthy  Significant family history but mother was a longtime smoker Several of the men in the family have coronary disease  Prior CT scan abdomen April 2018 and 2013 Images pulled up from 2018, no aortic atherosclerosis, none of the branch vessels have calcification, iliac vessels external and internal with no calcification  EKG personally reviewed by myself on todays visit Shows normal sinus rhythm no significant ST or T wave changes  Lab Results  Component Value Date   CHOL 215 (H) 10/19/2020   HDL 47.10 10/19/2020   LDLCALC 146 (H) 10/19/2020   TRIG 110.0 10/19/2020    Mother: smoker   PMH:   has a past medical history of Actinic keratosis, Basal cell carcinoma (04/13/2008), Basal cell carcinoma (10/07/2019), Basal cell carcinoma (BCC) of face (03/08/2009), Basal cell carcinoma (BCC) of face (11/28/2015), Basal cell carcinoma of trunk (03/29/2014), Cancer ([place]), GERD (gastroesophageal reflux disease), Hyperlipidemia, and Migraine.  PSH:    Past Surgical History:  Procedure Laterality Date  . CERVICAL CERCLAGE     6/84, 1/90, 8/93 under epidurals  . [place]  . CHOLECYSTECTOMY   1994  . COLONOSCOPY N/A 12/13/2014   Procedure: COLONOSCOPY;  Surgeon: Manya Silvas, MD;  Location: [facility] ENDOSCOPY;  Service: Endoscopy;  Laterality: N/A;  . EYE SURGERY  1998  . HERNIA REPAIR  1996   left hernia repair  . LEFT OOPHORECTOMY Left 2005   multiple complex cysts  . llq incisional hernia repair & mini-tummy tuck  07/1994  . nasal surgeries     9/87, 9/00, 11/04; deviated septum, ethmoidectomy, rhinoplasty, rhinoplasty revision    Current Outpatient Medications  Medication Sig Dispense Refill  . acetaminophen (TYLENOL) 325 MG tablet Take 650 mg by mouth every 6 (six) hours as needed.    . conjugated estrogens (PREMARIN) vaginal cream Place 1 Applicatorful vaginally daily. 42.5 g 12  . cycloSPORINE (RESTASIS) 0.05 % ophthalmic emulsion     . Ibuprofen (ADVIL PO) Take by mouth as needed.    . meloxicam (MOBIC) 15 MG tablet Take 15 mg by mouth daily.    . methocarbamol (ROBAXIN) 500 MG tablet Take 500 mg by mouth 4 (four) times daily.    . mometasone (ELOCON) 0.1 % cream Apply 1 application topically daily as needed (Rash). 45 g 2  . Naproxen Sodium (ALEVE PO) Take by mouth as needed.    . Olopatadine HCl (PATADAY OP) Apply to eye.    Marland Kitchen OVER THE COUNTER MEDICATION Vitamin B, C, D3 5,000 in/K23mcg 2-3 times per week    . Probiotic Product (PROBIOTIC PO) Take 30-50,000,000 Units by mouth at bedtime.    . rizatriptan (MAXALT-MLT) 10 MG disintegrating tablet  TAKE ONE TABLET AS NEEDED FOR MIGRAINE MAY REPEAT IN 2 HOURS IF NEEDED 10 tablet 5  . traMADol (ULTRAM) 50 MG tablet Take 1 tablet (50 mg total) by mouth every 6 (six) hours as needed. 30 tablet 0  . UNABLE TO FIND Med Name: Allergy Drops     No current facility-administered medications for this visit.    Allergies:   Amoxicillin, Rosuvastatin, and Contrast media [iodinated diagnostic agents]   Social History:  The patient  reports that she quit smoking about 42 years ago. Her smoking use included cigarettes. She has never  used smokeless tobacco. She reports current alcohol use of about 2.0 - 4.0 standard drinks of alcohol per week. She reports that she does not use drugs.   Family History:   family history includes Alcohol abuse in her mother; Arthritis in her mother; Breast cancer (age of onset: 57) in her maternal aunt; COPD in her mother; Colon cancer in her father; Depression in her mother; Diabetes in her brother, father, and mother; Hearing loss in her mother; Heart disease in her mother; Hyperlipidemia in her brother, father, and mother; Hypertension in her brother, father, and mother; Kidney disease in her mother; Liver disease in her paternal grandfather; Mental illness in her sister; Migraines in her daughter and maternal grandmother; Skin cancer in her mother; Stroke in her maternal grandfather; Vision loss in her mother.    Review of Systems: Review of Systems  Constitutional: Negative.   HENT: Negative.   Respiratory: Negative.   Cardiovascular: Negative.   Gastrointestinal: Negative.   Musculoskeletal: Negative.   Neurological: Negative.   Psychiatric/Behavioral: Negative.   All other systems reviewed and are negative.   PHYSICAL EXAM: VS:  BP 130/84 (BP Location: Right Arm, Patient Position: Sitting, Cuff Size: Normal)   Pulse 66   Ht 5\' 5"  (1.651 m)   Wt 141 lb 4 oz (64.1 kg)   SpO2 99%   BMI 23.51 kg/m  , BMI Body mass index is 23.51 kg/m. GEN: Well nourished, well developed, in no acute distress HEENT: normal Neck: no JVD, carotid bruits, or masses Cardiac: RRR; no murmurs, rubs, or gallops,no edema  Respiratory:  clear to auscultation bilaterally, normal work of breathing GI: soft, nontender, nondistended, + BS MS: no deformity or atrophy Skin: warm and dry, no rash Neuro:  Strength and sensation are intact Psych: euthymic mood, full affect   Recent Labs: 01/15/2020: Hemoglobin 14.7; Platelets 264.0 10/19/2020: ALT 24; BUN 13; Creatinine, Ser 0.75; Potassium 4.3; Sodium 139;  TSH 2.64    Lipid Panel Lab Results  Component Value Date   CHOL 215 (H) 10/19/2020   HDL 47.10 10/19/2020   LDLCALC 146 (H) 10/19/2020   TRIG 110.0 10/19/2020      Wt Readings from Last 3 Encounters:  11/15/20 141 lb 4 oz (64.1 kg)  10/19/20 139 lb (63 kg)  01/15/20 139 lb 9.6 oz (63.3 kg)     ASSESSMENT AND PLAN:  Problem List Items Addressed This Visit      Cardiology Problems   Hyperlipidemia LDL goal <130 - Primary   Relevant Orders   CT CARDIAC SCORING (SELF PAY ONLY)     Other   Elevated blood pressure reading in office with white coat syndrome, without diagnosis of hypertension   Relevant Orders   EKG 12-Lead   CT CARDIAC SCORING (SELF PAY ONLY)   Statin intolerance   Relevant Orders   CT CARDIAC SCORING (SELF PAY ONLY)    Other Visit Diagnoses  Family history of hyperlipidemia       Relevant Orders   CT CARDIAC SCORING (SELF PAY ONLY)   VAS US VASCUSCREEN   Abnormal electrocardiogram (ECG) (EKG)        Relevant Orders   CT CARDIAC SCORING (SELF PAY ONLY)     Family history/preventive care No significant plaque noted in mid to distal aorta and distal branch vessels After further discussion, will order carotid screening study, CT coronary calcium scoring.  We did discuss that this does not exclude soft plaque which can be missed on calcium scoring.  Hyperlipidemia Would prefer not to be on medications Has tried lovastatin pravastatin in the past, moved to Crestor for more aggressive management but had brain fog.  Prefers not to be on medications at this time.  We did discuss if needed Zetia could be used with low-dose statins We use screening studies above to help guide management      Total encounter time more than 45 minutes  Greater than 50% was spent in counseling and coordination of care with the patient    Signed, Esmond Plants, M.D., Ph.D. [place], [place]

## 2020-11-23 ENCOUNTER — Ambulatory Visit: Payer: Medicare Other | Admitting: Internal Medicine

## 2020-11-23 ENCOUNTER — Other Ambulatory Visit: Payer: Self-pay

## 2020-11-23 ENCOUNTER — Ambulatory Visit
Admission: RE | Admit: 2020-11-23 | Discharge: 2020-11-23 | Disposition: A | Discharge status: Home / Self Care | Payer: Medicare Other | Source: Ambulatory Visit | Attending: Cardiovascular Disease | Admitting: Cardiovascular Disease

## 2020-11-23 DIAGNOSIS — R030 Elevated blood-pressure reading, without diagnosis of hypertension: Secondary | ICD-10-CM

## 2020-11-23 DIAGNOSIS — E785 Hyperlipidemia, unspecified: Secondary | ICD-10-CM

## 2020-11-23 DIAGNOSIS — R9431 Abnormal electrocardiogram [ECG] [EKG]: Secondary | ICD-10-CM | POA: Insufficient documentation

## 2020-11-23 DIAGNOSIS — Z789 Other specified health status: Secondary | ICD-10-CM

## 2020-11-23 DIAGNOSIS — Z83438 Family history of other disorder of lipoprotein metabolism and other lipidemia: Secondary | ICD-10-CM | POA: Insufficient documentation

## 2020-12-07 ENCOUNTER — Telehealth: Payer: Self-pay

## 2020-12-07 NOTE — Telephone Encounter (Signed)
Able to reach pt regarding her recent  CT Calcium Score, Brianna Calhoun had a chance to review her results and advised   "CT coronary calcium score  Score of 0  Excellent finding, indicating no calcified coronary atherosclerotic plaque noted  No other significant findings on the scan were picked up"   Brianna Calhoun seen results on her MyChart posted by Brianna Calhoun, is thankful for the phone call and good report, has no questions at this time.

## 2020-12-12 ENCOUNTER — Other Ambulatory Visit: Payer: Self-pay

## 2020-12-12 ENCOUNTER — Ambulatory Visit: Payer: Medicare Other

## 2020-12-12 DIAGNOSIS — Z83438 Family history of other disorder of lipoprotein metabolism and other lipidemia: Secondary | ICD-10-CM

## 2020-12-12 DIAGNOSIS — E785 Hyperlipidemia, unspecified: Secondary | ICD-10-CM

## 2020-12-22 ENCOUNTER — Ambulatory Visit: Payer: Medicare Other

## 2021-01-02 ENCOUNTER — Other Ambulatory Visit: Payer: Self-pay

## 2021-01-02 ENCOUNTER — Encounter: Payer: Self-pay | Admitting: Physical Therapy

## 2021-01-02 ENCOUNTER — Ambulatory Visit: Payer: Medicare Other | Attending: Internal Medicine | Admitting: Physical Therapy

## 2021-01-02 DIAGNOSIS — M2170 Unequal limb length (acquired), unspecified site: Secondary | ICD-10-CM | POA: Diagnosis present

## 2021-01-02 DIAGNOSIS — R278 Other lack of coordination: Secondary | ICD-10-CM

## 2021-01-02 DIAGNOSIS — G8929 Other chronic pain: Secondary | ICD-10-CM

## 2021-01-02 DIAGNOSIS — M542 Cervicalgia: Secondary | ICD-10-CM | POA: Diagnosis present

## 2021-01-02 DIAGNOSIS — M6208 Separation of muscle (nontraumatic), other site: Secondary | ICD-10-CM | POA: Diagnosis present

## 2021-01-02 DIAGNOSIS — M545 Low back pain, unspecified: Secondary | ICD-10-CM | POA: Insufficient documentation

## 2021-01-03 NOTE — Therapy (Signed)
[place] MAIN [facility] SERVICES [address] [place], [place], [address] Phone: [phone]   Fax:  [phone]  Physical Therapy Evaluation  Patient Details  Name: Brianna Calhoun MRN: [id number] Date of Birth: [birth year] Referring Provider (PT): Derrel Nip   Encounter Date: 01/02/2021   PT End of Session - 01/03/21 1008     Visit Number 1    Number of Visits 10    Date for PT Re-Evaluation 03/14/21   eval 6/28   PT Start Time 1600    PT Stop Time 1700    PT Time Calculation (min) 60 min             Past Medical History:  Diagnosis Date   Actinic keratosis    Basal cell carcinoma 04/13/2008   Right temple   Basal cell carcinoma 10/07/2019   Right chest   Basal cell carcinoma (BCC) of face 03/08/2009   Right temple, recurrent   Basal cell carcinoma (BCC) of face 11/28/2015   Right temple, hairline. Nodular pattern   Basal cell carcinoma of trunk 03/29/2014   Right paraspinal upper back. Superficial    Cancer ([place])    skin   GERD (gastroesophageal reflux disease)    Hyperlipidemia    Migraine     Past Surgical History:  Procedure Laterality Date   CERVICAL CERCLAGE     6/84, 1/90, 8/93 under epidurals   [place], [place]   COLONOSCOPY N/A 12/13/2014   Procedure: COLONOSCOPY;  Surgeon: Manya Silvas, MD;  Location: [place];  Service: Endoscopy;  Laterality: N/A;   EYE SURGERY  1998   HERNIA REPAIR  1996   left hernia repair   LEFT OOPHORECTOMY Left 2005   multiple complex cysts   llq incisional hernia repair & mini-tummy tuck  07/1994   nasal surgeries     9/87, 9/00, 11/04; deviated septum, ethmoidectomy, rhinoplasty, rhinoplasty revision    There were no vitals filed for this visit.   Subjective Assessment - 01/02/21 1612     Subjective 1) urinary frequency and urgency: Pt saw her PCP in April when she was feeling urinary urgency and frequency. It is better now  and she has cut back on bladder irritants ( coffee, tea).  Pt goes to the bathroom "just in case" before activities in the community.  Daily fluid intake: 48 fl oz water, 12 fl oz coffee, 12 oz iced tea.  Pt no longer has problems with leakage before making it to the toilet.     2) Fecal urgency and incomplete emptying:  Pt notices fecal urgency after breakfast and coffee. Pt feels she has wipe mutltiple times and not feel she has completely emptied across 2 days out of one week. Bowel movements occur once a daily and not having constipation any more. Pt has had hemorrhoid surgeries. Pt uses flax seed for bowel movements.    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 Years ago, it was bad on L with sciatic but it resolved over time. CLBP 0-2/10 pain level to the touch. Pain doesnot interfere with activities.   4) neck pain R side from occiput to paraspinals to scapula. Pt has been getting botox for migraines and tried dry needling. Pt currently is caring for grand baby who weights 18 lbs across 2 days out of the week.    5)  GI disturbance :  pt eats and well bloated and has acide reflux    Pertinent History 4 C-sections, L oopherectomy ( multiple benign cycst removed), tummy tuck and injguinal hernia repair ( 1996), multiple skin cancer removed, hermorrhoid surgeries,gall bladder removal. . Diastasis recti present since her laproscopic oopherectomy  Physical activity: Walk 3 mi 2-3 days of walking, pickle ball 3 x week,gym workout: aerobics, step, weight training, Zumba, performed sit ups and crunches in the past .  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 Pt has practiced kegels, squeezing muscles while urinating since 2020.    Patient Stated Goals prevent wearing Depends, decrease neck and LBP                [facility] PT Assessment - 01/03/21 0001       Assessment   Medical Diagnosis urinary urgency    Referring  Provider (PT) [place]      Precautions   Precautions None      Restrictions   Weight Bearing Restrictions No      Balance Screen   Has the patient fallen in the past 6 months No      Observation/Other Assessments   Scoliosis R shoulder lowered, R iliac crest higher standing:  in supine, levelled ASIS, lower malleoli L      Coordination   Coordination and Movement Description chest breathing      Palpation   Spinal mobility L rotation limited > R, no reproduction of LBP with 4 directions of the spine    Palpation comment abdominal scar adhesions      Bed Mobility   Bed Mobility --   headlift     Ambulation/Gait   Gait Comments L pelvic sway, R shoulder lowered  ( Post Tx: shoe lift in R flip flop taped: more reciporcal gait)                        Pelvic Floor Special Questions - 01/02/21 1653     Diastasis Recti 3 fingers width below sternum               [facility] Adult PT Treatment/Exercise - 01/03/21 1022       Bed Mobility   Bed Mobility --   headlift     Ambulation/Gait   Gait Comments L pelvic sway, R shoulder lowered  ( Post Tx: shoe lift in R flip flop taped: more reciporcal gait)      Therapeutic Activites    Other Therapeutic Activities explained IAP system with anatomy/ p hysiology, explained the role of shoe lift in R shoe      Neuro Re-ed    Neuro Re-ed Details  cued for clamshells, body mechanics to minimize straining pelvic floor                         PT Long Term Goals - 01/02/21 1633       PT LONG TERM GOAL #1   Title Pt will decrease her epsiodes of incomplete emptying of bowel movements from 4 x week to < 2 x week in order to improve hygiene and function    Time 6    Period Weeks    Status New    Target Date 02/13/21      PT LONG TERM GOAL #2   Title Pt will no longer need to go to the toilet "just in case" and only go when  she feels the urge to attend community activitiies    Time 8    Period Weeks     Status New    Target Date 02/27/21      PT LONG TERM GOAL #3   Title Pt will be IND with modifications to fitness routine with planks, push ups  to minimzie neck pain    Time 8    Period Weeks    Status New    Target Date 02/27/21      PT LONG TERM GOAL #4   Title Pt will have improved score on Lumbar  FOTO from 73 pts to > 77 pt   in order to improve QOL and have less LBP    Time 8    Period Weeks    Status New    Target Date 02/27/21      PT LONG TERM GOAL #5   Title Pt will demo no diastasis recti and decreased abdominal scar adhesions with proper coordination of deep core mm in order to decrease bloating/ gas  after meals.    Time 10    Period Weeks    Status New    Target Date 03/13/21      Additional Long Term Goals   Additional Long Term Goals --      PT LONG TERM GOAL #6   Title Pt will demo improved B hip abduction strength from 3/5 to > 4/5 in order to improve pelvic stability to continue playing pickle ball in    Time 10    Period Weeks    Status New    Target Date 03/13/21      PT LONG TERM GOAL #7   Title Pt will improve FOTO for urinary problem 64 pts to > 69 pt to bowel leakage 58 pts to > 63 pts    Time 10    Period Weeks    Status New    Target Date 03/13/21                   Plan - 01/03/21 1008     Clinical Impression Statement  Pt is a  65  yo  who presents with urinary and fecal urgency , urinary frequency, abdominal bloating/gas, neck pain, and CLBP which impact her QOL and ADLs.   Pt's musculoskeletal assessment revealed leg length difference, uneven pelvic girdle and shoulder height, gait deviations, diastasis recti, limited spinal mobility, forward head posture, dyscoordination and strength of pelvic floor mm, weak hip weakness, and poor body mechanics which places strain on the abdominal/pelvic floor mm.   These are deficits that indicate an ineffective intraabdominal pressure system associated with increased risk for pt's Sx.     Pt will benefit from proper coordination training and education on fitness and functional positions in order to yield greater outcomes as pt performed sit-ups/ crunches in the past. Advised pt to not perform sit-ups and crunches as these movement patterns lead to more downward forces on the pelvic floor, negatively impacting abdominopelvic/spinal dysfunctions.   Pt was provided education on etiology of Sx with anatomy, physiology explanation with images along with the benefits of customized pelvic PT Tx based on pt's medical conditions and musculoskeletal deficits.  Explained the physiology of deep core mm coordination and roles of pelvic floor function in urination, defecation, sexual function, and postural control with deep core mm system.   Regional interdependent approaches will yield greater benefits in pt's POC due leg length difference and spinal / pelvic misalignments.  Suspect pt's CLBP and neck pain will improve with leg length getting addressed with shoe lift today in combination with further manual Tx along spine and strengthening/ coordination training in upcoming sessions .    Following Tx today, pt demo'd equal alignment of pelvic girdle and no more spinal curves, and less gait deviations after placement of shoe lift in R shoe. Plan to address structural alignment and then progress to deep core strengthening and coordination and pelvic assessment in upcoming sessions. Initiated hip abduction strengthening today which pt requried cues for proper technique. Pt benefits from skilled PT.       Examination-Activity Limitations Continence;Toileting    Stability/Clinical Decision Making Evolving/Moderate complexity    Clinical Decision Making Moderate    Rehab Potential Good    PT Frequency 1x / week    PT Duration Other (comment)    PT Treatment/Interventions Gait training;Neuromuscular re-education;Moist Heat;Therapeutic activities;Functional mobility training;Therapeutic  exercise;Taping;Manual techniques;Patient/family education;Traction;Scar mobilization;Energy conservation;Passive range of motion;Balance training;ADLs/Self Care Home Management;Stair training    Consulted and Agree with Plan of Care Patient             Patient will benefit from skilled therapeutic intervention in order to improve the following deficits and impairments:  Decreased coordination, Decreased mobility, Decreased scar mobility, Increased muscle spasms, Decreased strength, Decreased range of motion, Decreased endurance, Decreased activity tolerance, Difficulty walking, Hypomobility, Postural dysfunction, Improper body mechanics, Pain, Impaired flexibility  Visit Diagnosis: Other lack of coordination  Diastasis recti  Neck pain  Chronic right-sided low back pain without sciatica     Problem List Patient Active Problem List   Diagnosis Date Noted   Urge incontinence 10/21/2020   Elevated blood pressure reading in office with white coat syndrome, without diagnosis of hypertension 10/21/2020   Arthralgia of both hands 10/21/2020   Urinary frequency 10/21/2020   Perineal irritation in female 01/17/2020   Hyperplastic polyp of large intestine 01/15/2020   Diverticulosis of colon 01/15/2020   Internal hemorrhoid, bleeding 01/15/2020   Chronic migraine without aura without status migrainosus, not intractable 09/21/2019   History of migraine headaches 07/30/2019   Educated about COVID-19 virus infection 01/16/2019   Statin intolerance 08/08/2017   Encounter for preventive health examination 06/03/2017   Hyperlipidemia LDL goal <130 10/16/2016   Anxiety state 10/16/2016   Allergy to iodinated contrast media 10/16/2016    Jerl Mina ,PT, DPT, E-RYT  01/03/2021, 10:30 AM  [place] MAIN [facility] SERVICES [address] [place], [place], [address] Phone: [phone]   Fax:  [phone]  Name: Brianna Calhoun MRN: [id number] Date  of Birth: Apr 21, 1956

## 2021-01-11 ENCOUNTER — Telehealth: Payer: Self-pay | Admitting: Internal Medicine

## 2021-01-11 NOTE — Telephone Encounter (Signed)
Left message for patient to call back and schedule Medicare Annual Wellness Visit (AWV) in office.   If not able to come in office, please offer to do virtually or by telephone.   Last AWV:12/22/2019  Please schedule at anytime with Nurse Health Advisor.

## 2021-01-16 ENCOUNTER — Ambulatory Visit: Payer: Medicare Other | Attending: Internal Medicine | Admitting: Physical Therapy

## 2021-01-16 ENCOUNTER — Ambulatory Visit: Payer: Medicare Other | Admitting: Physical Therapy

## 2021-01-16 ENCOUNTER — Other Ambulatory Visit: Payer: Self-pay

## 2021-01-16 DIAGNOSIS — M533 Sacrococcygeal disorders, not elsewhere classified: Secondary | ICD-10-CM | POA: Insufficient documentation

## 2021-01-16 DIAGNOSIS — G8929 Other chronic pain: Secondary | ICD-10-CM | POA: Diagnosis present

## 2021-01-16 DIAGNOSIS — M2170 Unequal limb length (acquired), unspecified site: Secondary | ICD-10-CM

## 2021-01-16 DIAGNOSIS — M6208 Separation of muscle (nontraumatic), other site: Secondary | ICD-10-CM

## 2021-01-16 DIAGNOSIS — M542 Cervicalgia: Secondary | ICD-10-CM | POA: Diagnosis present

## 2021-01-16 DIAGNOSIS — M545 Low back pain, unspecified: Secondary | ICD-10-CM | POA: Diagnosis present

## 2021-01-16 DIAGNOSIS — R278 Other lack of coordination: Secondary | ICD-10-CM

## 2021-01-16 NOTE — Therapy (Signed)
[place] MAIN [facility] SERVICES [address] [place], [place], [address] Phone: [phone]   Fax:  [phone]  Physical Therapy Treatment  Patient Details  Name: Brianna Calhoun MRN: [id number] Date of Birth: 1956-04-21 Referring Provider (PT): Derrel Nip   Encounter Date: 01/16/2021   PT End of Session - 01/16/21 0853     Visit Number 2    Number of Visits 10    Date for PT Re-Evaluation 03/14/21   eval 6/28   PT Start Time 0804    PT Stop Time 0900    PT Time Calculation (min) 56 min             Past Medical History:  Diagnosis Date   Actinic keratosis    Basal cell carcinoma 04/13/2008   Right temple   Basal cell carcinoma 10/07/2019   Right chest   Basal cell carcinoma (BCC) of face 03/08/2009   Right temple, recurrent   Basal cell carcinoma (BCC) of face 11/28/2015   Right temple, hairline. Nodular pattern   Basal cell carcinoma of trunk 03/29/2014   Right paraspinal upper back. Superficial    Cancer ([place])    skin   GERD (gastroesophageal reflux disease)    Hyperlipidemia    Migraine     Past Surgical History:  Procedure Laterality Date   CERVICAL CERCLAGE     6/84, 1/90, 8/93 under epidurals   [place], [place]   COLONOSCOPY N/A 12/13/2014   Procedure: COLONOSCOPY;  Surgeon: Manya Silvas, MD;  Location: [place];  Service: Endoscopy;  Laterality: N/A;   EYE SURGERY  1998   HERNIA REPAIR  1996   left hernia repair   LEFT OOPHORECTOMY Left 2005   multiple complex cysts   llq incisional hernia repair & mini-tummy tuck  07/1994   nasal surgeries     9/87, 9/00, 11/04; deviated septum, ethmoidectomy, rhinoplasty, rhinoplasty revision    There were no vitals filed for this visit.   Subjective Assessment - 01/16/21 0811     Subjective Pt has been paying attention to not doing ab exercises and to sit upright with her posture and not crossing her legs     Pertinent History 4 C-sections, L oopherectomy ( multiple benign cycst removed), tummy tuck and injguinal hernia repair ( 1996), multiple skin cancer removed, hermorrhoid surgeries,gall bladder removal. . Diastasis recti present since her laproscopic oopherectomy  Physical activity: Walk 3 mi 2-3 days of walking, pickle ball 3 x week,gym workout: aerobics, step, weight training, Zumba, performed sit ups and crunches in the past .  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 Pt has practiced kegels, squeezing muscles while urinating since 2020.    Patient Stated Goals prevent wearing Depends, decrease neck and LBP                [facility] PT Assessment - 01/16/21 0812       Observation/Other Assessments   Scoliosis levelled iliac crest with shoe lift in RLE , R shoulder slightly lowered      Palpation   Spinal mobility hypomobility T11-L3, medial scapula mm tightness    Palpation comment C section scar/ tummy tuck scar restrictions                           [facility] Adult PT Treatment/Exercise - 01/16/21 8182  Neuro Re-ed    Neuro Re-ed Details  cued for new HEP for mobility in T spine and abdominal scars      Manual Therapy   Manual therapy comments STM/MWM fascial mobilizations ar problem areas noted in assessment                         PT Long Term Goals - 01/02/21 1633       PT LONG TERM GOAL #1   Title Pt will decrease her epsiodes of incomplete emptying of bowel movements from 4 x week to < 2 x week in order to improve hygiene and function    Time 6    Period Weeks    Status New    Target Date 02/13/21      PT LONG TERM GOAL #2   Title Pt will no longer need to go to the toilet "just in case" and only go when she feels the urge to attend community activitiies    Time 8    Period Weeks    Status New    Target Date 02/27/21      PT LONG TERM GOAL #3   Title Pt will be IND with modifications to  fitness routine with planks, push ups  to minimzie neck pain    Time 8    Period Weeks    Status New    Target Date 02/27/21      PT LONG TERM GOAL #4   Title Pt will have improved score on Lumbar  FOTO from 73 pts to > 77 pt   in order to improve QOL and have less LBP    Time 8    Period Weeks    Status New    Target Date 02/27/21      PT LONG TERM GOAL #5   Title Pt will demo no diastasis recti and decreased abdominal scar adhesions with proper coordination of deep core mm in order to decrease bloating/ gas  after meals.    Time 10    Period Weeks    Status New    Target Date 03/13/21      Additional Long Term Goals   Additional Long Term Goals --      PT LONG TERM GOAL #6   Title Pt will demo improved B hip abduction strength from 3/5 to > 4/5 in order to improve pelvic stability to continue playing pickle ball in    Time 10    Period Weeks    Status New    Target Date 03/13/21      PT LONG TERM GOAL #7   Title Pt will improve FOTO for urinary problem 64 pts to > 69 pt to bowel leakage 58 pts to > 63 pts    Time 10    Period Weeks    Status New    Target Date 03/13/21                   Plan - 01/16/21 0853     Clinical Impression Statement Pt showed good carry  over with levelled pelvic alignment with shoe lift in RLE.  Manual Tx addressed remainder of thoracic curve on R and abdominal scar restrictions. Post Tx, pt demo'd improved mobility at thoracic spine and mobility at scars which deems pt ready for deep core and scapulothoracic strengthening at next session. Pt continues to benefit from skilled PT.    Examination-Activity Limitations Continence;Toileting  Stability/Clinical Decision Making Evolving/Moderate complexity    Rehab Potential Good    PT Frequency 1x / week    PT Duration Other (comment)    PT Treatment/Interventions Gait training;Neuromuscular re-education;Moist Heat;Therapeutic activities;Functional mobility training;Therapeutic  exercise;Taping;Manual techniques;Patient/family education;Traction;Scar mobilization;Energy conservation;Passive range of motion;Balance training;ADLs/Self Care Home Management;Stair training    Consulted and Agree with Plan of Care Patient             Patient will benefit from skilled therapeutic intervention in order to improve the following deficits and impairments:  Decreased coordination, Decreased mobility, Decreased scar mobility, Increased muscle spasms, Decreased strength, Decreased range of motion, Decreased endurance, Decreased activity tolerance, Difficulty walking, Hypomobility, Postural dysfunction, Improper body mechanics, Pain, Impaired flexibility  Visit Diagnosis: Other lack of coordination  Diastasis recti  Neck pain  Chronic right-sided low back pain without sciatica  Leg length discrepancy     Problem List Patient Active Problem List   Diagnosis Date Noted   Urge incontinence 10/21/2020   Elevated blood pressure reading in office with white coat syndrome, without diagnosis of hypertension 10/21/2020   Arthralgia of both hands 10/21/2020   Urinary frequency 10/21/2020   Perineal irritation in female 01/17/2020   Hyperplastic polyp of large intestine 01/15/2020   Diverticulosis of colon 01/15/2020   Internal hemorrhoid, bleeding 01/15/2020   Chronic migraine without aura without status migrainosus, not intractable 09/21/2019   History of migraine headaches 07/30/2019   Educated about COVID-19 virus infection 01/16/2019   Statin intolerance 08/08/2017   Encounter for preventive health examination 06/03/2017   Hyperlipidemia LDL goal <130 10/16/2016   Anxiety state 10/16/2016   Allergy to iodinated contrast media 10/16/2016    Jerl Mina ,PT, DPT, E-RYT  01/16/2021, 8:57 AM  [place] MAIN [facility] SERVICES [address] [place], [place], [address] Phone: [phone]   Fax:  [phone]  Name: AMARANTA MEHL MRN: [id number] Date of Birth: 1956-06-22

## 2021-01-16 NOTE — Patient Instructions (Signed)
  Lengthen Back rib by R  shoulder    Lie on L  side , pillow between knees and under head  Pull  arm overhead over mattress, grab the edge of mattress,pull it upward, drawing elbow away from ears  Breathing 10 reps  Open book (handout)  Lying on  _ side , rotating  __ only this week  Rotating onto pillow /yoga block  Pillow/ Block between knees  10 reps  __  Abdominal massage upward from L,  R , center to belly button 3 stroke x 3 , pressure is gentle and light with all fingers flat, not using finger tips

## 2021-01-23 ENCOUNTER — Other Ambulatory Visit: Payer: Self-pay

## 2021-01-23 ENCOUNTER — Ambulatory Visit: Payer: Medicare Other | Admitting: Physical Therapy

## 2021-01-23 DIAGNOSIS — M545 Low back pain, unspecified: Secondary | ICD-10-CM

## 2021-01-23 DIAGNOSIS — M6208 Separation of muscle (nontraumatic), other site: Secondary | ICD-10-CM

## 2021-01-23 DIAGNOSIS — M542 Cervicalgia: Secondary | ICD-10-CM

## 2021-01-23 DIAGNOSIS — M2170 Unequal limb length (acquired), unspecified site: Secondary | ICD-10-CM

## 2021-01-23 DIAGNOSIS — G8929 Other chronic pain: Secondary | ICD-10-CM

## 2021-01-23 DIAGNOSIS — R278 Other lack of coordination: Secondary | ICD-10-CM | POA: Diagnosis not present

## 2021-01-23 NOTE — Therapy (Addendum)
[place] MAIN [facility] SERVICES [address] [place], [place], [address] Phone: [phone]   Fax:  [phone]  Physical Therapy Treatment  Patient Details  Name: Brianna Calhoun MRN: [id number] Date of Birth: 03/13/1956 Referring Provider (PT): Derrel Nip   Encounter Date: 01/23/2021   PT End of Session - 01/23/21 1138     Visit Number 3    Number of Visits 10    Date for PT Re-Evaluation 03/14/21   eval 6/28   PT Start Time 0803    PT Stop Time 0900    PT Time Calculation (min) 57 min             Past Medical History:  Diagnosis Date   Actinic keratosis    Basal cell carcinoma 04/13/2008   Right temple   Basal cell carcinoma 10/07/2019   Right chest   Basal cell carcinoma (BCC) of face 03/08/2009   Right temple, recurrent   Basal cell carcinoma (BCC) of face 11/28/2015   Right temple, hairline. Nodular pattern   Basal cell carcinoma of trunk 03/29/2014   Right paraspinal upper back. Superficial    Cancer ([place])    skin   GERD (gastroesophageal reflux disease)    Hyperlipidemia    Migraine     Past Surgical History:  Procedure Laterality Date   CERVICAL CERCLAGE     6/84, 1/90, 8/93 under epidurals   [place], [place]   COLONOSCOPY N/A 12/13/2014   Procedure: COLONOSCOPY;  Surgeon: Manya Silvas, MD;  Location: [place];  Service: Endoscopy;  Laterality: N/A;   EYE SURGERY  1998   HERNIA REPAIR  1996   left hernia repair   LEFT OOPHORECTOMY Left 2005   multiple complex cysts   llq incisional hernia repair & mini-tummy tuck  07/1994   nasal surgeries     9/87, 9/00, 11/04; deviated septum, ethmoidectomy, rhinoplasty, rhinoplasty revision    There were no vitals filed for this visit.   Subjective Assessment - 01/23/21 1147     Subjective Pt felt her midback being looser after last session    Pertinent History 4 C-sections, L oopherectomy ( multiple benign cycst  removed), tummy tuck and injguinal hernia repair ( 1996), multiple skin cancer removed, hermorrhoid surgeries,gall bladder removal. . Diastasis recti present since her laproscopic oopherectomy  Physical activity: Walk 3 mi 2-3 days of walking, pickle ball 3 x week,gym workout: aerobics, step, weight training, Zumba, performed sit ups and crunches in the past .  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 Pt has practiced kegels, squeezing muscles while urinating since 2020.    Patient Stated Goals prevent wearing Depends, decrease neck and LBP                [facility] PT Assessment - 01/23/21 1141       Palpation   Palpation comment hypomobile L C/T junction, tightenss , upper trap, levator, medial scap mm                           [facility] Adult PT Treatment/Exercise - 01/23/21 1142       Neuro Re-ed    Neuro Re-ed Details  cued for cervical/ scapular retraction/ depression B with band exercises -gravity elininated position  PT Long Term Goals - 01/23/21 1633                 PT LONG TERM GOAL #1   Title Pt will decrease her epsiodes of incomplete emptying of bowel movements from 4 x week to < 2 x week in order to improve hygiene and function    Time 6    Period Weeks    Status On-going      PT LONG TERM GOAL #2   Title Pt will no longer need to go to the toilet "just in case" and only go when she feels the urge to attend community activitiies    Time 8    Period Weeks    Status On-going      PT LONG TERM GOAL #3   Title Pt will be IND with modifications to fitness routine with planks, push ups  to minimzie neck pain    Time 8    Period Weeks    Status On-going      PT LONG TERM GOAL #4   Title Pt will have improved score on Lumbar  FOTO from 73 pts to > 77 pt   in order to improve QOL and have less LBP    Time 8    Period Weeks    Status On-going      PT LONG TERM GOAL #5    Title Pt will demo no diastasis recti and decreased abdominal scar adhesions with proper coordination of deep core mm in order to decrease bloating/ gas  after meals.    Time 10    Period Weeks    Status On-going      PT LONG TERM GOAL #6   Title Pt will demo improved B hip abduction strength from 3/5 to > 4/5 in order to improve pelvic stability to continue playing pickle ball in    Time 10    Period Weeks    Status New      PT LONG TERM GOAL #7   Title Pt will improve FOTO for urinary problem 64 pts to > 69 pt to bowel leakage 58 pts to > 63 pts    Time 10    Period Weeks    Status On-going          Plan - 01/23/21 1141     Clinical Impression Statement Pt is demonstrating significantly less spinal curves and more levelled pelvis. There was slight elevated R upper quadrant which was addressed. Pt demo'd increased cervical mobility post Tx. Initiated cervical/scapular retraction strengthening with cues required. Pt continues to benefit from skilled PT. Plan to assess pelvic floor next session.   Examination-Activity Limitations Continence;Toileting    Stability/Clinical Decision Making Evolving/Moderate complexity    Rehab Potential Good    PT Frequency 1x / week    PT Duration Other (comment)    PT Treatment/Interventions Gait training;Neuromuscular re-education;Moist Heat;Therapeutic activities;Functional mobility training;Therapeutic exercise;Taping;Manual techniques;Patient/family education;Traction;Scar mobilization;Energy conservation;Passive range of motion;Balance training;ADLs/Self Care Home Management;Stair training    Consulted and Agree with Plan of Care Patient             Patient will benefit from skilled therapeutic intervention in order to improve the following deficits and impairments:  Decreased coordination, Decreased mobility, Decreased scar mobility, Increased muscle spasms, Decreased strength, Decreased range of motion, Decreased endurance, Decreased  activity tolerance, Difficulty walking, Hypomobility, Postural dysfunction, Improper body mechanics, Pain, Impaired flexibility  Visit Diagnosis: Other lack of coordination  Diastasis recti  Neck pain  Chronic right-sided low back pain without sciatica  Leg length discrepancy     Problem List Patient Active Problem List   Diagnosis Date Noted   Urge incontinence 10/21/2020   Elevated blood pressure reading in office with white coat syndrome, without diagnosis of hypertension 10/21/2020   Arthralgia of both hands 10/21/2020   Urinary frequency 10/21/2020   Perineal irritation in female 01/17/2020   Hyperplastic polyp of large intestine 01/15/2020   Diverticulosis of colon 01/15/2020   Internal hemorrhoid, bleeding 01/15/2020   Chronic migraine without aura without status migrainosus, not intractable 09/21/2019   History of migraine headaches 07/30/2019   Educated about COVID-19 virus infection 01/16/2019   Statin intolerance 08/08/2017   Encounter for preventive health examination 06/03/2017   Hyperlipidemia LDL goal <130 10/16/2016   Anxiety state 10/16/2016   Allergy to iodinated contrast media 10/16/2016    Jerl Mina ,PT, DPT, E-RYT   01/23/2021, 11:47 AM  [facility] [address] [place], [place], [address] Phone: [phone]   Fax:  [phone]  Name: Brianna Calhoun MRN: [id number] Date of Birth: 07-01-1956

## 2021-01-26 ENCOUNTER — Telehealth: Payer: Self-pay | Admitting: Internal Medicine

## 2021-01-26 NOTE — Telephone Encounter (Signed)
PT called to advise that currently her husband and kids all have covid. She has tested negative so far and has no symptoms other then a migraine she had yesterday. She would like to get some advise on what to do before the weekend comes.

## 2021-01-26 NOTE — Telephone Encounter (Signed)
Is there anything else other than letting the pt know that she needs to quarantine for 10 days and if she develops symptoms to get tested as soon as possible.

## 2021-01-26 NOTE — Telephone Encounter (Signed)
Spoke with pt and she stated that she still has not tested positive but wanted to know what she was supposed to do if the weekend came and she ended testing positive. I advised pt that the only thing at this point is to quarantine for 10 days and if she develops symptoms to get tested as soon as possible. Pt wanted to know if Dr. Derrel Nip would prescribe paxlovid or azithromycin and prednisone. I explained to pt that Dr. Derrel Nip does not usually prescribe the Paxlovid and that she would need at appt for her to prescribe any antibiotics and prednisone. Pt was advisd that if she gets sick over the weekend she should be evaluated at urgent care or she could do a mychart video visit.

## 2021-01-30 ENCOUNTER — Ambulatory Visit: Payer: Medicare Other | Admitting: Physical Therapy

## 2021-01-30 MED ORDER — RIZATRIPTAN BENZOATE 10 MG PO TBDP: ORAL_TABLET | ORAL | 5 refills | Status: DC

## 2021-01-30 NOTE — Telephone Encounter (Signed)
I have refilled the Maxalt.

## 2021-01-31 MED ORDER — ESTROGENS, CONJUGATED 0.625 MG/GM VA CREA: TOPICAL_CREAM | VAGINAL | 3 refills | Status: DC

## 2021-01-31 MED ORDER — METHOCARBAMOL 500 MG PO TABS: 500.0000 mg | ORAL_TABLET | Freq: Four times a day (QID) | ORAL | 3 refills | Status: AC | PRN

## 2021-01-31 NOTE — Telephone Encounter (Unsigned)
Prescription printed for signing and pickup  after 2 pm Please   include a "Good RX" card, which may enable you to get it somewhere else cheaper. It will be ready for pickup after 2 pm today

## 2021-01-31 NOTE — Telephone Encounter (Signed)
Placed in quick sign folder.  

## 2021-02-06 ENCOUNTER — Ambulatory Visit: Payer: Medicare Other | Admitting: Physical Therapy

## 2021-02-13 ENCOUNTER — Ambulatory Visit: Payer: Medicare Other | Attending: Internal Medicine | Admitting: Physical Therapy

## 2021-02-13 ENCOUNTER — Other Ambulatory Visit: Payer: Self-pay

## 2021-02-13 DIAGNOSIS — M545 Low back pain, unspecified: Secondary | ICD-10-CM | POA: Diagnosis present

## 2021-02-13 DIAGNOSIS — M542 Cervicalgia: Secondary | ICD-10-CM | POA: Insufficient documentation

## 2021-02-13 DIAGNOSIS — M2170 Unequal limb length (acquired), unspecified site: Secondary | ICD-10-CM | POA: Insufficient documentation

## 2021-02-13 DIAGNOSIS — M6208 Separation of muscle (nontraumatic), other site: Secondary | ICD-10-CM | POA: Insufficient documentation

## 2021-02-13 DIAGNOSIS — R278 Other lack of coordination: Secondary | ICD-10-CM

## 2021-02-13 DIAGNOSIS — M533 Sacrococcygeal disorders, not elsewhere classified: Secondary | ICD-10-CM | POA: Diagnosis not present

## 2021-02-13 DIAGNOSIS — G8929 Other chronic pain: Secondary | ICD-10-CM | POA: Insufficient documentation

## 2021-02-13 NOTE — Patient Instructions (Addendum)
Locust pose  Pillow under hips if needed for decreased low back pain  Palms face midline by hips  Finger tips shooting straight down  Imagine holding pencil under your armpits Draw shoulders away from ears Inhale Exhale lift chest up slightly without feeling it in your back. The bend happens in the midback  (keep chin tucked)   15 reps __  MINI Low cobra:  On belly, palms under armpits, elbows pointed to ceiling  Inhale: lengthen crown of the head away from shoulders Exhale, feel belly hug in and press palms into the floor, squeezing elbows/shoulder blades towards each other while chest lifts about 5 cm off of the floor. You should feel the hinging movement at mid back and not the low back.   15 reps   ___  Stretches :    6 directions of the spine :  5 reps   Rotation: armswings to rotate the spine  Side flexion: arm overhead arching like a rainbow , both sides  Forward bend and strainghtening up: minisquat-  Like you are about to sit and then stand up with a slight lift in the chest    _wall stretch Clock stretch with head turns :  stand perpendicular to the wall, L side to the wall Tilt head to wall  Place L palm at 7 o clock Chin tuck like you are looking into armpit Look at "[place] on giant map  Swivel head with chin tucked to look in upper corner of ceiling as if you are look at  [place] on giant map "   5 reps   Switch direction, R palm on wall at 5 o 'clock   Chin tuck like you are looking into armpit Look at "[place]  on giant map  Swivel head with chin tucked to look in upper corner of ceiling as if you are look at  [facility] on giant map "   5 reps   __    Side of hip stretch:  Reclined twist for hips and side of the hips/ legs  Lay on your back, knees bend Scoot hips to the R , leave shoulders in place Drop knees to the L side resting onto pillows to keep leg at the same width of hips Pillow under L thigh to minimize too much strain

## 2021-02-13 NOTE — Therapy (Signed)
[place] MAIN [facility] SERVICES [address] [place], [place], [address] Phone: [phone]   Fax:  [phone]  Physical Therapy Treatment  Patient Details  Name: Brianna Calhoun MRN: [id number] Date of Birth: May 01, 1956 Referring Provider (PT): Derrel Nip   Encounter Date: 02/13/2021   PT End of Session - 02/13/21 0806     Visit Number 4    Number of Visits 10    Date for PT Re-Evaluation 03/14/21   eval 6/28   PT Start Time 0801    PT Stop Time 0900    PT Time Calculation (min) 59 min             Past Medical History:  Diagnosis Date   Actinic keratosis    Basal cell carcinoma 04/13/2008   Right temple   Basal cell carcinoma 10/07/2019   Right chest   Basal cell carcinoma (BCC) of face 03/08/2009   Right temple, recurrent   Basal cell carcinoma (BCC) of face 11/28/2015   Right temple, hairline. Nodular pattern   Basal cell carcinoma of trunk 03/29/2014   Right paraspinal upper back. Superficial    Cancer ([place])    skin   GERD (gastroesophageal reflux disease)    Hyperlipidemia    Migraine     Past Surgical History:  Procedure Laterality Date   CERVICAL CERCLAGE     6/84, 1/90, 8/93 under epidurals   [place], [place]   COLONOSCOPY N/A 12/13/2014   Procedure: COLONOSCOPY;  Surgeon: Manya Silvas, MD;  Location: [place];  Service: Endoscopy;  Laterality: N/A;   EYE SURGERY  1998   HERNIA REPAIR  1996   left hernia repair   LEFT OOPHORECTOMY Left 2005   multiple complex cysts   llq incisional hernia repair & mini-tummy tuck  07/1994   nasal surgeries     9/87, 9/00, 11/04; deviated septum, ethmoidectomy, rhinoplasty, rhinoplasty revision    There were no vitals filed for this visit.   Subjective Assessment - 02/13/21 0805     Subjective Pt reported her urgency seems to be better.    Pertinent History 4 C-sections, L oopherectomy ( multiple benign cycst removed),  tummy tuck and injguinal hernia repair ( 1996), multiple skin cancer removed, hermorrhoid surgeries,gall bladder removal. . Diastasis recti present since her laproscopic oopherectomy  Physical activity: Walk 3 mi 2-3 days of walking, pickle ball 3 x week,gym workout: aerobics, step, weight training, Zumba, performed sit ups and crunches in the past .  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 Pt has practiced kegels, squeezing muscles while urinating since 2020.    Patient Stated Goals prevent wearing Depends, decrease neck and LBP                [facility] PT Assessment - 02/13/21 0854       Coordination   Coordination and Movement Description upper trap overuse with upright cervical movements      Palpation   SI assessment  L FADDIR restrictions, tightenss at glut med/minimus    Palpation comment tightness at B occiputs, masseter, hypomobile C6-7/ deviation to L                           [facility] Adult PT Treatment/Exercise - 02/13/21 0855       Therapeutic Activites    Other Therapeutic Activities cued for  use of baby carrier when carrying grandbaby and not shifting her her hip to L and other techniques      Neuro Re-ed    Neuro Re-ed Details  cued for cervico-scapular strengthening mm in new HEP to minimize forward head      Moist Heat Therapy   Number Minutes Moist Heat 5 Minutes    Moist Heat Location --   cervical ( unbilled)     Manual Therapy   Manual therapy comments occiputal release, STM/MWM at areas noted in assessment mobilize C6-7 ( small motions to promote rotation),   SIJ: AP mob Grade III in FADIIR/ FADDER, rotational distraction to promote SIJ mobnility.                        PT Long Term Goals - 01/02/21 1633       PT LONG TERM GOAL #1   Title Pt will decrease her epsiodes of incomplete emptying of bowel movements from 4 x week to < 2 x week in order to improve hygiene and function    Time 6     Period Weeks    Status New    Target Date 02/13/21      PT LONG TERM GOAL #2   Title Pt will no longer need to go to the toilet "just in case" and only go when she feels the urge to attend community activitiies    Time 8    Period Weeks    Status New    Target Date 02/27/21      PT LONG TERM GOAL #3   Title Pt will be IND with modifications to fitness routine with planks, push ups  to minimzie neck pain    Time 8    Period Weeks    Status New    Target Date 02/27/21      PT LONG TERM GOAL #4   Title Pt will have improved score on Lumbar  FOTO from 73 pts to > 77 pt   in order to improve QOL and have less LBP    Time 8    Period Weeks    Status New    Target Date 02/27/21      PT LONG TERM GOAL #5   Title Pt will demo no diastasis recti and decreased abdominal scar adhesions with proper coordination of deep core mm in order to decrease bloating/ gas  after meals.    Time 10    Period Weeks    Status New    Target Date 03/13/21      Additional Long Term Goals   Additional Long Term Goals --      PT LONG TERM GOAL #6   Title Pt will demo improved B hip abduction strength from 3/5 to > 4/5 in order to improve pelvic stability to continue playing pickle ball in    Time 10    Period Weeks    Status New    Target Date 03/13/21      PT LONG TERM GOAL #7   Title Pt will improve FOTO for urinary problem 64 pts to > 69 pt to bowel leakage 58 pts to > 63 pts    Time 10    Period Weeks    Status New    Target Date 03/13/21                   Plan - 02/13/21 0806     Clinical Impression  Statement Pt urgency is improving. Focused on neck issues today.  Pt demo'd improved rotation and more mobile C6-7 segments post Tx. Pt required cues for cervico-scapular strengthening mm in new HEP to minimize forward head and upper trap overuse with cervical movements. Pt continue to benefit from skilled PT. Plan to progress posterior chain , add stretches for pickle ball, and  assess pelvic floor at upcoming sessions.    Examination-Activity Limitations Continence;Toileting    Stability/Clinical Decision Making Evolving/Moderate complexity    Rehab Potential Good    PT Frequency 1x / week    PT Duration Other (comment)    PT Treatment/Interventions Gait training;Neuromuscular re-education;Moist Heat;Therapeutic activities;Functional mobility training;Therapeutic exercise;Taping;Manual techniques;Patient/family education;Traction;Scar mobilization;Energy conservation;Passive range of motion;Balance training;ADLs/Self Care Home Management;Stair training    Consulted and Agree with Plan of Care Patient             Patient will benefit from skilled therapeutic intervention in order to improve the following deficits and impairments:  Decreased coordination, Decreased mobility, Decreased scar mobility, Increased muscle spasms, Decreased strength, Decreased range of motion, Decreased endurance, Decreased activity tolerance, Difficulty walking, Hypomobility, Postural dysfunction, Improper body mechanics, Pain, Impaired flexibility  Visit Diagnosis: Other lack of coordination  Diastasis recti  Neck pain  Chronic right-sided low back pain without sciatica  Leg length discrepancy     Problem List Patient Active Problem List   Diagnosis Date Noted   Urge incontinence 10/21/2020   Elevated blood pressure reading in office with white coat syndrome, without diagnosis of hypertension 10/21/2020   Arthralgia of both hands 10/21/2020   Urinary frequency 10/21/2020   Perineal irritation in female 01/17/2020   Hyperplastic polyp of large intestine 01/15/2020   Diverticulosis of colon 01/15/2020   Internal hemorrhoid, bleeding 01/15/2020   Chronic migraine without aura without status migrainosus, not intractable 09/21/2019   History of migraine headaches 07/30/2019   Educated about COVID-19 virus infection 01/16/2019   Statin intolerance 08/08/2017   Encounter for  preventive health examination 06/03/2017   Hyperlipidemia LDL goal <130 10/16/2016   Anxiety state 10/16/2016   Allergy to iodinated contrast media 10/16/2016    Jerl Mina ,PT, DPT, E-RYT  02/13/2021, 9:43 AM  [place] MAIN [facility] SERVICES [address] [place], [place], [address] Phone: [phone]   Fax:  [phone]  Name: Brianna Calhoun MRN: [id number] Date of Birth: [birth year]

## 2021-02-15 ENCOUNTER — Ambulatory Visit (INDEPENDENT_AMBULATORY_CARE_PROVIDER_SITE_OTHER): Payer: Medicare Other | Admitting: Neurology

## 2021-02-15 ENCOUNTER — Encounter: Payer: Self-pay | Admitting: Neurology

## 2021-02-15 ENCOUNTER — Other Ambulatory Visit: Payer: Self-pay

## 2021-02-15 DIAGNOSIS — G43709 Chronic migraine without aura, not intractable, without status migrainosus: Secondary | ICD-10-CM

## 2021-02-15 NOTE — Progress Notes (Signed)
Consent Form Botulism Toxin Injection For Chronic Migraine   Interval history 02/15/2021: Stable. See picture. Ask her about +5 each masseters and if it helped with her clenching.   Interval history 11/10/2020: Stable  08/09/2020::4th botox, did great, > 80% improvement in frequency, she has a picture. No masseters. Some extra in the occiput and also in the levators(ask her for placement) last time she marked up her shoulders and neck with the spots she wanted injected that really hurt her.   Reviewed orally with patient, additionally signature is on file:  Botulism toxin has been approved by the Federal drug administration for treatment of chronic migraine. Botulism toxin does not cure chronic migraine and it may not be effective in some patients.  The administration of botulism toxin is accomplished by injecting a small amount of toxin into the muscles of the neck and head. Dosage must be titrated for each individual. Any benefits resulting from botulism toxin tend to wear off after 3 months with a repeat injection required if benefit is to be maintained. Injections are usually done every 3-4 months with maximum effect peak achieved by about 2 or 3 weeks. Botulism toxin is expensive and you should be sure of what costs you will incur resulting from the injection.  The side effects of botulism toxin use for chronic migraine may include:   -Transient, and usually mild, facial weakness with facial injections  -Transient, and usually mild, head or neck weakness with head/neck injections  -Reduction or loss of forehead facial animation due to forehead muscle weakness  -Eyelid drooping  -Dry eye  -Pain at the site of injection or bruising at the site of injection  -Double vision  -Potential unknown long term risks  Contraindications: You should not have Botox if you are pregnant, nursing, allergic to albumin, have an infection, skin condition, or muscle weakness at the site of the injection, or  have myasthenia gravis, Lambert-Eaton syndrome, or ALS.  It is also possible that as with any injection, there may be an allergic reaction or no effect from the medication. Reduced effectiveness after repeated injections is sometimes seen and rarely infection at the injection site may occur. All care will be taken to prevent these side effects. If therapy is given over a long time, atrophy and wasting in the muscle injected may occur. Occasionally the patient's become refractory to treatment because they develop antibodies to the toxin. In this event, therapy needs to be modified.  I have read the above information and consent to the administration of botulism toxin.    BOTOX PROCEDURE NOTE FOR MIGRAINE HEADACHE    Contraindications and precautions discussed with patient(above). Aseptic procedure was observed and patient tolerated procedure. Procedure performed by Dr. Georgia Dom  The condition has existed for more than 6 months, and pt does not have a diagnosis of ALS, Myasthenia Gravis or Lambert-Eaton Syndrome.  Risks and benefits of injections discussed and pt agrees to proceed with the procedure.  Written consent obtained  These injections are medically necessary. Pt  receives good benefits from these injections. These injections do not cause sedations or hallucinations which the oral therapies may cause.  Description of procedure:  The patient was placed in a sitting position. The standard protocol was used for Botox as follows, with 5 units of Botox injected at each site:   -Procerus muscle, midline injection  -Corrugator muscle, bilateral injection  -Frontalis muscle, bilateral injection, with 2 sites each side, medial injection was performed in the upper one third  of the frontalis muscle, in the region vertical from the medial inferior edge of the superior orbital rim. The lateral injection was again in the upper one third of the forehead vertically above the lateral limbus of the  cornea, 1.5 cm lateral to the medial injection site.  -Temporalis muscle injection, 4 sites, bilaterally. The first injection was 3 cm above the tragus of the ear, second injection site was 1.5 cm to 3 cm up from the first injection site in line with the tragus of the ear. The third injection site was 1.5-3 cm forward between the first 2 injection sites. The fourth injection site was 1.5 cm posterior to the second injection site.   -Occipitalis muscle injection, 3 sites, bilaterally. The first injection was done one half way between the occipital protuberance and the tip of the mastoid process behind the ear. The second injection site was done lateral and superior to the first, 1 fingerbreadth from the first injection. The third injection site was 1 fingerbreadth superiorly and medially from the first injection site.  -Cervical paraspinal muscle injection, 2 sites, bilateral knee first injection site was 1 cm from the midline of the cervical spine, 3 cm inferior to the lower border of the occipital protuberance. The second injection site was 1.5 cm superiorly and laterally to the first injection site.  -Trapezius muscle injection was performed at 3 sites, bilaterally. The first injection site was in the upper trapezius muscle halfway between the inflection point of the neck, and the acromion. The second injection site was one half way between the acromion and the first injection site. The third injection was done between the first injection site and the inflection point of the neck.   Will return for repeat injection in 3 months.   200 units of Botox was used, any Botox not injected was wasted. The patient tolerated the procedure well, there were no complications of the above procedure.

## 2021-02-15 NOTE — Progress Notes (Signed)
Botox- 200 units x 1 vial Lot: [id number] Expiration: 07/2023 NDC: [id number]  Bacteriostatic 0.9% Sodium Chloride- 33m total Lot: [id number]: 07/09/2022 NDC: [id number] Dx: [id number]

## 2021-02-20 ENCOUNTER — Ambulatory Visit: Payer: Medicare Other | Admitting: Physical Therapy

## 2021-02-20 ENCOUNTER — Other Ambulatory Visit: Payer: Self-pay

## 2021-02-20 DIAGNOSIS — M6208 Separation of muscle (nontraumatic), other site: Secondary | ICD-10-CM

## 2021-02-20 DIAGNOSIS — M2170 Unequal limb length (acquired), unspecified site: Secondary | ICD-10-CM

## 2021-02-20 DIAGNOSIS — R278 Other lack of coordination: Secondary | ICD-10-CM | POA: Diagnosis not present

## 2021-02-20 DIAGNOSIS — M545 Low back pain, unspecified: Secondary | ICD-10-CM

## 2021-02-20 DIAGNOSIS — G8929 Other chronic pain: Secondary | ICD-10-CM

## 2021-02-20 DIAGNOSIS — M542 Cervicalgia: Secondary | ICD-10-CM

## 2021-02-20 NOTE — Therapy (Addendum)
[place] MAIN [facility] SERVICES [address] [place], [place], [address] Phone: [phone]   Fax:  [phone]  Physical Therapy Treatment  Patient Details  Name: Brianna Calhoun MRN: [id number] Date of Birth: [birth year] Referring Provider (PT): Derrel Nip   Encounter Date: 02/20/2021   PT End of Session - 02/20/21 0925     Visit Number 5    Number of Visits 10    Date for PT Re-Evaluation 03/14/21   eval 6/28   PT Start Time 0805    PT Stop Time 0910    PT Time Calculation (min) 65 min             Past Medical History:  Diagnosis Date   Actinic keratosis    Basal cell carcinoma 04/13/2008   Right temple   Basal cell carcinoma 10/07/2019   Right chest   Basal cell carcinoma (BCC) of face 03/08/2009   Right temple, recurrent   Basal cell carcinoma (BCC) of face 11/28/2015   Right temple, hairline. Nodular pattern   Basal cell carcinoma of trunk 03/29/2014   Right paraspinal upper back. Superficial    Cancer ([place])    skin   GERD (gastroesophageal reflux disease)    Hyperlipidemia    Migraine     Past Surgical History:  Procedure Laterality Date   CERVICAL CERCLAGE     6/84, 1/90, 8/93 under epidurals   [place], [place]   COLONOSCOPY N/A 12/13/2014   Procedure: COLONOSCOPY;  Surgeon: Manya Silvas, MD;  Location: [facility];  Service: Endoscopy;  Laterality: N/A;   EYE SURGERY  1998   HERNIA REPAIR  1996   left hernia repair   LEFT OOPHORECTOMY Left 2005   multiple complex cysts   llq incisional hernia repair & mini-tummy tuck  07/1994   nasal surgeries     9/87, 9/00, 11/04; deviated septum, ethmoidectomy, rhinoplasty, rhinoplasty revision    There were no vitals filed for this visit.   Subjective Assessment - 02/20/21 0834     Subjective Pt feels her urgency has improved by 50%. Pt feels tightness at R lower rib today. Pt played pickle ball yesterday.    Pertinent  History 4 C-sections, L oopherectomy ( multiple benign cycst removed), tummy tuck and inguinal hernia repair ( 1996), multiple skin cancer removed, hermorrhoid surgeries,gall bladder removal. . Diastasis recti present since her laproscopic oopherectomy  Physical activity: Walk 3 mi 2-3 days of walking, pickle ball 3 x week,gym workout: aerobics, step, weight training, Zumba, performed sit ups and crunches in the past .  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 Pt has practiced kegels, squeezing muscles while urinating since 2020.    Patient Stated Goals prevent wearing Depends, decrease neck and LBP                [facility] PT Assessment - 02/20/21 0835       Observation/Other Assessments   Observations cervical ext in sidelying and in prone HEP, required cues for more cervical retraction with trunk rotation exercise      Palpation   Spinal mobility hypomobile T10-T12, L2-3 , tightness at posterior intercostals R    Palpation comment C section / tummy tuck scar restrictions L side more than R  [place] Adult PT Treatment/Exercise - 02/20/21 0951       Neuro Re-ed    Neuro Re-ed Details  cued anterior tilt of pelvis , pelvic floor stretches, cued for cervical retraction      Moist Heat Therapy   Number Minutes Moist Heat 5 Minutes    Moist Heat Location --   abdomen     Manual Therapy   Manual therapy comments fascial releases over abdominal scars    Internal Pelvic Floor STM/MWM at coccygeus, 5-7 o'clock  tender and tight ( improved mobility post Tx)                         PT Long Term Goals - 01/02/21 1633       PT LONG TERM GOAL #1   Title Pt will decrease her epsiodes of incomplete emptying of bowel movements from 4 x week to < 2 x week in order to improve hygiene and function    Time 6    Period Weeks    Status New    Target Date 02/13/21      PT LONG TERM GOAL #2   Title Pt  will no longer need to go to the toilet "just in case" and only go when she feels the urge to attend community activitiies    Time 8    Period Weeks    Status New    Target Date 02/27/21      PT LONG TERM GOAL #3   Title Pt will be IND with modifications to fitness routine with planks, push ups  to minimzie neck pain    Time 8    Period Weeks    Status New    Target Date 02/27/21      PT LONG TERM GOAL #4   Title Pt will have improved score on Lumbar  FOTO from 73 pts to > 77 pt   in order to improve QOL and have less LBP    Time 8    Period Weeks    Status New    Target Date 02/27/21      PT LONG TERM GOAL #5   Title Pt will demo no diastasis recti and decreased abdominal scar adhesions with proper coordination of deep core mm in order to decrease bloating/ gas  after meals.    Time 10    Period Weeks    Status New    Target Date 03/13/21      Additional Long Term Goals   Additional Long Term Goals --      PT LONG TERM GOAL #6   Title Pt will demo improved B hip abduction strength from 3/5 to > 4/5 in order to improve pelvic stability to continue playing pickle ball in    Time 10    Period Weeks    Status New    Target Date 03/13/21      PT LONG TERM GOAL #7   Title Pt will improve FOTO for urinary problem 64 pts to > 69 pt to bowel leakage 58 pts to > 63 pts    Time 10    Period Weeks    Status New    Target Date 03/13/21                   Plan - 02/20/21 [id number]     Clinical Impression Statement Pt required cues for cervical / scapular retraction. Pt continued to require manual Tx to minimize  tightness at  R posterior rib area which is likely related to her overuse of R hand in pickle ball playing. Pt demo'd improved mobility post-Tx/ Plan to add strengthening of posterior chain of back mm next sessions.   Internal pelvic floor assessment showed increased pelvic floor tightness at posterior wall 2/2 Hx of hemorrhoid banding and tightness at anterior wall 2/2  scar restrictions from 4 C-sections, tummy tuck. Pt demo'd improved mobility but will continue to require further manual Tx to these areas. Pt continues to benefit from skilled PT     Examination-Activity Limitations Continence;Toileting    Stability/Clinical Decision Making Evolving/Moderate complexity    Rehab Potential Good    PT Frequency 1x / week    PT Duration Other (comment)    PT Treatment/Interventions Gait training;Neuromuscular re-education;Moist Heat;Therapeutic activities;Functional mobility training;Therapeutic exercise;Taping;Manual techniques;Patient/family education;Traction;Scar mobilization;Energy conservation;Passive range of motion;Balance training;ADLs/Self Care Home Management;Stair training    Consulted and Agree with Plan of Care Patient             Patient will benefit from skilled therapeutic intervention in order to improve the following deficits and impairments:  Decreased coordination, Decreased mobility, Decreased scar mobility, Increased muscle spasms, Decreased strength, Decreased range of motion, Decreased endurance, Decreased activity tolerance, Difficulty walking, Hypomobility, Postural dysfunction, Improper body mechanics, Pain, Impaired flexibility  Visit Diagnosis: No diagnosis found.     Problem List Patient Active Problem List   Diagnosis Date Noted   Urge incontinence 10/21/2020   Elevated blood pressure reading in office with white coat syndrome, without diagnosis of hypertension 10/21/2020   Arthralgia of both hands 10/21/2020   Urinary frequency 10/21/2020   Perineal irritation in female 01/17/2020   Hyperplastic polyp of large intestine 01/15/2020   Diverticulosis of colon 01/15/2020   Internal hemorrhoid, bleeding 01/15/2020   Chronic migraine without aura without status migrainosus, not intractable 09/21/2019   History of migraine headaches 07/30/2019   Educated about COVID-19 virus infection 01/16/2019   Statin intolerance  08/08/2017   Encounter for preventive health examination 06/03/2017   Hyperlipidemia LDL goal <130 10/16/2016   Anxiety state 10/16/2016   Allergy to iodinated contrast media 10/16/2016    Jerl Mina ,PT, DPT, E-RYT  02/20/2021, 10:00 AM  [place] MAIN [facility] SERVICES [address] [place], [place], [address] Phone: [phone]   Fax:  [phone]  Name: JAIME VANWIEREN MRN: [id number] Date of Birth: February 16, 1956

## 2021-02-20 NOTE — Patient Instructions (Signed)
Stretch for pelvic floor   V- slides  "v heels slide away and then back toward buttocks and then rock knee to slight ,  slide heel along at 11 o clock away from buttocks   10 reps     Mermaid stretch  Rocking while seated on the floor with heels to one side of the hip Heels to one side of the hip  Rock forward towards the knee that is bent , rock beck towards the opposite sitting bones   Happy baby

## 2021-02-27 ENCOUNTER — Other Ambulatory Visit: Payer: Self-pay

## 2021-02-27 ENCOUNTER — Ambulatory Visit: Payer: Medicare Other | Admitting: Physical Therapy

## 2021-02-27 DIAGNOSIS — R278 Other lack of coordination: Secondary | ICD-10-CM | POA: Diagnosis not present

## 2021-02-27 DIAGNOSIS — M542 Cervicalgia: Secondary | ICD-10-CM

## 2021-02-27 DIAGNOSIS — M2170 Unequal limb length (acquired), unspecified site: Secondary | ICD-10-CM

## 2021-02-27 DIAGNOSIS — G8929 Other chronic pain: Secondary | ICD-10-CM

## 2021-02-27 DIAGNOSIS — M6208 Separation of muscle (nontraumatic), other site: Secondary | ICD-10-CM

## 2021-02-27 NOTE — Therapy (Addendum)
[place] MAIN [facility] SERVICES [address] [place], [place], [address] Phone: [phone]   Fax:  [phone]  Physical Therapy Treatment  Patient Details  Name: Brianna Calhoun MRN: [id number] Date of Birth: 06/26/1956 Referring Provider (PT): Derrel Nip   Encounter Date: 02/27/2021   PT End of Session - 02/27/21 1836     Visit Number 6    Number of Visits 10    Date for PT Re-Evaluation 03/14/21   eval 6/28   PT Start Time 0804    PT Stop Time 0902    PT Time Calculation (min) 58 min             Past Medical History:  Diagnosis Date   Actinic keratosis    Basal cell carcinoma 04/13/2008   Right temple   Basal cell carcinoma 10/07/2019   Right chest   Basal cell carcinoma (BCC) of face 03/08/2009   Right temple, recurrent   Basal cell carcinoma (BCC) of face 11/28/2015   Right temple, hairline. Nodular pattern   Basal cell carcinoma of trunk 03/29/2014   Right paraspinal upper back. Superficial    Cancer ([place])    skin   GERD (gastroesophageal reflux disease)    Hyperlipidemia    Migraine     Past Surgical History:  Procedure Laterality Date   CERVICAL CERCLAGE     6/84, 1/90, 8/93 under epidurals   [place], [place]   COLONOSCOPY N/A 12/13/2014   Procedure: COLONOSCOPY;  Surgeon: Manya Silvas, MD;  Location: [facility];  Service: Endoscopy;  Laterality: N/A;   EYE SURGERY  1998   HERNIA REPAIR  1996   left hernia repair   LEFT OOPHORECTOMY Left 2005   multiple complex cysts   llq incisional hernia repair & mini-tummy tuck  07/1994   nasal surgeries     9/87, 9/00, 11/04; deviated septum, ethmoidectomy, rhinoplasty, rhinoplasty revision    There were no vitals filed for this visit.   Subjective Assessment - 02/27/21 1838     Subjective pt reported she feels achey after playing pickle ball. Would like to learn stretches to do for pickleball    Pertinent History 4  C-sections, L oopherectomy ( multiple benign cycst removed), tummy tuck and inguinal hernia repair ( 1996), multiple skin cancer removed, hermorrhoid surgeries,gall bladder removal. . Diastasis recti present since her laproscopic oopherectomy  Physical activity: Walk 3 mi 2-3 days of walking, pickle ball 3 x week,gym workout: aerobics, step, weight training, Zumba, performed sit ups and crunches in the past .  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 Pt has practiced kegels, squeezing muscles while urinating since 2020.    Patient Stated Goals prevent wearing Depends, decrease neck and LBP                [facility] PT Assessment - 02/27/21 1834       Observation/Other Assessments   Observations more upright head posture      Flexibility   Hamstrings B tightness                          [facility] Adult PT Treatment/Exercise - 02/27/21 1835       Therapeutic Activites    Other Therapeutic Activities explained mm groups stretches and technique      Neuro Re-ed    Neuro Re-ed Details  cued for technique to streteches  upper and lower body                         PT Long Term Goals - 01/02/21 1633       PT LONG TERM GOAL #1   Title Pt will decrease her epsiodes of incomplete emptying of bowel movements from 4 x week to < 2 x week in order to improve hygiene and function    Time 6    Period Weeks    Status New    Target Date 02/13/21      PT LONG TERM GOAL #2   Title Pt will no longer need to go to the toilet "just in case" and only go when she feels the urge to attend community activitiies    Time 8    Period Weeks    Status New    Target Date 02/27/21      PT LONG TERM GOAL #3   Title Pt will be IND with modifications to fitness routine with planks, push ups  to minimzie neck pain    Time 8    Period Weeks    Status New    Target Date 02/27/21      PT LONG TERM GOAL #4   Title Pt will have improved score  on Lumbar  FOTO from 73 pts to > 77 pt   in order to improve QOL and have less LBP    Time 8    Period Weeks    Status New    Target Date 02/27/21      PT LONG TERM GOAL #5   Title Pt will demo no diastasis recti and decreased abdominal scar adhesions with proper coordination of deep core mm in order to decrease bloating/ gas  after meals.    Time 10    Period Weeks    Status New    Target Date 03/13/21      Additional Long Term Goals   Additional Long Term Goals --      PT LONG TERM GOAL #6   Title Pt will demo improved B hip abduction strength from 3/5 to > 4/5 in order to improve pelvic stability to continue playing pickle ball in    Time 10    Period Weeks    Status New    Target Date 03/13/21      PT LONG TERM GOAL #7   Title Pt will improve FOTO for urinary problem 64 pts to > 69 pt to bowel leakage 58 pts to > 63 pts    Time 10    Period Weeks    Status New    Target Date 03/13/21                   Plan - 02/27/21 1833     Clinical Impression Statement Pt required cues for technique and alignment for stretches to perform for upper and lower body to compliment pickleball playing and to minimize relapse of asymmetrical mm tightness. Pt continues to benefit from skilled PT.  Plan to reassess pelvic floor next session.    Examination-Activity Limitations Continence;Toileting    Stability/Clinical Decision Making Evolving/Moderate complexity    Rehab Potential Good    PT Frequency 1x / week    PT Duration Other (comment)    PT Treatment/Interventions Gait training;Neuromuscular re-education;Moist Heat;Therapeutic activities;Functional mobility training;Therapeutic exercise;Taping;Manual techniques;Patient/family education;Traction;Scar mobilization;Energy conservation;Passive range of motion;Balance training;ADLs/Self Care Home Management;Stair training  Consulted and Agree with Plan of Care Patient             Patient will benefit from skilled  therapeutic intervention in order to improve the following deficits and impairments:  Decreased coordination, Decreased mobility, Decreased scar mobility, Increased muscle spasms, Decreased strength, Decreased range of motion, Decreased endurance, Decreased activity tolerance, Difficulty walking, Hypomobility, Postural dysfunction, Improper body mechanics, Pain, Impaired flexibility  Visit Diagnosis: Other lack of coordination  Neck pain  Diastasis recti  Chronic right-sided low back pain without sciatica  Leg length discrepancy     Problem List Patient Active Problem List   Diagnosis Date Noted   Urge incontinence 10/21/2020   Elevated blood pressure reading in office with white coat syndrome, without diagnosis of hypertension 10/21/2020   Arthralgia of both hands 10/21/2020   Urinary frequency 10/21/2020   Perineal irritation in female 01/17/2020   Hyperplastic polyp of large intestine 01/15/2020   Diverticulosis of colon 01/15/2020   Internal hemorrhoid, bleeding 01/15/2020   Chronic migraine without aura without status migrainosus, not intractable 09/21/2019   History of migraine headaches 07/30/2019   Educated about COVID-19 virus infection 01/16/2019   Statin intolerance 08/08/2017   Encounter for preventive health examination 06/03/2017   Hyperlipidemia LDL goal <130 10/16/2016   Anxiety state 10/16/2016   Allergy to iodinated contrast media 10/16/2016    Jerl Mina ,PT, DPT, E-RYT  02/27/2021, 6:39 PM  [place] MAIN [facility] SERVICES [address] [place], [place], [address] Phone: [phone]   Fax:  [phone]  Name: BRECKYN WENSTROM MRN: [id number] Date of Birth: 1955/12/17

## 2021-02-27 NOTE — Patient Instructions (Signed)
Upper body:  Standing: _Arms up and angel wings down _ cross arm over thumbs down  - pulse forward back and forth to loosen shoulder blades, flip palms up to nose and down  Repeat over arm on top   _arm swings without pelvis moving  _dolphin squats on wall _doplhin arms move up   _bear stretch at door way   _ kitchen counter stretches  Hands on kitchen counter,   Palms shoulder width apart  Minisquat postion Trunk is parallel to floor  A) Pull buttocks back to lengthen spine, knees bent  3 breaths   B) Bring R hand to the L, and stretch the R side trunk  3 breaths   Brings hands to center again Do the same to the L side stretch by placing L hand on top of R   D) Modified thread the needle R hand on L thigh, L  thigh pushing out slightly as the R hands pull in,  elbow bent and pulls to theR,  Look under L armpit   Do the same to other side  ____   Stretches : (Cuing provided for proper alignment)  Instructions start with Strap on R    Stretches for your legs: LAYING on Back Use upper arms and elbows for stability when pulling strap Opposite knee bent and foot firm in align with hip   Strap on ballmound:  Hip socket  _strap, L knee bent, R ballmound against strap and spread toes, rolling foot 15 deg out and in across midline.  10 reps each side   Hamstring _knee bends  10 reps  With knee pointing straight ( slightly to outside to minimize snapping sensation)      10 reps with knee pointing out towards armpit ( notice the stretch in the medial hamstring muscle)     IT band  _scoot hips to R, cross R leg over L and straighten knee with strap on ballmound,   Bend knee back and forth 5x    Quad in sidelying _strap around the ankle, pulling ankle towards buttocks  Bottom leg firm and stabilization with knee bent  Adductors and pelvic floor ( Happy Baby) : Delane Ginger are wide towards armpits, sole of feet towards ceiling   On your back again:  Strap  under R thigh Hip abductors ( figure 4)      ,L ankle over R thigh ( stretching L glut)     5  Breaths

## 2021-03-06 ENCOUNTER — Ambulatory Visit: Payer: Medicare Other | Admitting: Physical Therapy

## 2021-03-06 ENCOUNTER — Encounter: Payer: Self-pay | Admitting: Emergency Medicine

## 2021-03-06 ENCOUNTER — Ambulatory Visit
Admission: EM | Admit: 2021-03-06 | Discharge: 2021-03-06 | Disposition: A | Discharge status: Home / Self Care | Payer: Medicare Other | Attending: Emergency Medicine | Admitting: Emergency Medicine

## 2021-03-06 ENCOUNTER — Other Ambulatory Visit: Payer: Self-pay

## 2021-03-06 DIAGNOSIS — M6208 Separation of muscle (nontraumatic), other site: Secondary | ICD-10-CM

## 2021-03-06 DIAGNOSIS — R350 Frequency of micturition: Secondary | ICD-10-CM | POA: Diagnosis present

## 2021-03-06 DIAGNOSIS — G8929 Other chronic pain: Secondary | ICD-10-CM

## 2021-03-06 DIAGNOSIS — R278 Other lack of coordination: Secondary | ICD-10-CM | POA: Diagnosis not present

## 2021-03-06 DIAGNOSIS — B373 Candidiasis of vulva and vagina: Secondary | ICD-10-CM | POA: Diagnosis present

## 2021-03-06 DIAGNOSIS — M2170 Unequal limb length (acquired), unspecified site: Secondary | ICD-10-CM

## 2021-03-06 DIAGNOSIS — N390 Urinary tract infection, site not specified: Secondary | ICD-10-CM

## 2021-03-06 DIAGNOSIS — B3731 Acute candidiasis of vulva and vagina: Secondary | ICD-10-CM

## 2021-03-06 DIAGNOSIS — M545 Low back pain, unspecified: Secondary | ICD-10-CM

## 2021-03-06 DIAGNOSIS — M542 Cervicalgia: Secondary | ICD-10-CM

## 2021-03-06 LAB — POCT URINALYSIS DIP (MANUAL ENTRY)
Bilirubin, UA: NEGATIVE
Glucose, UA: NEGATIVE mg/dL
Ketones, POC UA: NEGATIVE mg/dL
Nitrite, UA: NEGATIVE
Protein Ur, POC: NEGATIVE mg/dL
Spec Grav, UA: <=1.005 — AB (ref 1.010–1.025)
Urobilinogen, UA: 0.2 E.U./dL
pH, UA: 5 (ref 5.0–8.0)

## 2021-03-06 MED ORDER — FLUCONAZOLE 150 MG PO TABS: 150.0000 mg | ORAL_TABLET | Freq: Every day | ORAL | 0 refills | Status: AC

## 2021-03-06 NOTE — Discharge Instructions (Addendum)
Take your first Diflucan today and your second Diflucan in the next 3 to 5 days if symptoms do not improve.  A vaginal yeast infection is caused by too many yeast cells in the vagina. This is common in women of all ages. Itching, vaginal discharge and irritation, and other symptoms can bother you. But yeast infections don't often cause other health problems.  Some medicines can increase your risk of getting a yeast infection. These include antibiotics, birth control pills, hormones, and steroids. You may also be more likely to get a yeast infection if you are pregnant, have diabetes, douche, or wear tight clothes.  With treatment, most yeast infections get better in 2 to 3 days.  Do not use tampons while using a vaginal cream or suppository. The tampons can absorb the medicine. Use pads instead. Wear loose cotton clothing. Do not wear nylon or other fabric that holds body heat and moisture close to the skin. Try sleeping without underwear. Do not scratch. Relieve itching with a cold pack or a cool bath. Do not wash your vaginal area more than once a day. Use plain water or a mild, unscented soap. Air-dry the vaginal area. Change out of wet swimsuits after swimming. Do not have sex until you have finished your treatment. Do not douche  Return to clinic if you have unexpected vaginal bleeding or if there is new or increased pain in your pelvis. Follow-up with your gynecologist if you have symptoms that return after treatment or if you have recurrent infections.    Your urine sample today was sent for a culture.  If your urine indicates that you have an infection in the urine, we will call you to initiate antibiotic therapy.  If you do not receive a call in the next 3-4 days, it means that you do not need antibiotics for a urinary tract infection.  Please go to the ER for continued nausea, vomiting, unilateral back pain, or worsening urinary symptoms.

## 2021-03-06 NOTE — Therapy (Addendum)
[place] [facility] [address] [place], [place], [address] Phone: [phone]   Fax:  [phone]  Physical Therapy Treatment  Patient Details  Name: Brianna Calhoun MRN: [id number] Date of Birth: 1955-12-19 Referring Provider (PT): Derrel Nip   Encounter Date: 03/06/2021   PT End of Session - 03/06/21 0814     Visit Number 7    Number of Visits 10    Date for PT Re-Evaluation 03/14/21   eval 6/28   PT Start Time 0805    PT Stop Time 0900    PT Time Calculation (min) 55 min             Past Medical History:  Diagnosis Date   Actinic keratosis    Basal cell carcinoma 04/13/2008   Right temple   Basal cell carcinoma 10/07/2019   Right chest   Basal cell carcinoma (BCC) of face 03/08/2009   Right temple, recurrent   Basal cell carcinoma (BCC) of face 11/28/2015   Right temple, hairline. Nodular pattern   Basal cell carcinoma of trunk 03/29/2014   Right paraspinal upper back. Superficial    Cancer ([place])    skin   GERD (gastroesophageal reflux disease)    Hyperlipidemia    Migraine     Past Surgical History:  Procedure Laterality Date   CERVICAL CERCLAGE     6/84, 1/90, 8/93 under epidurals   [place], [place]   COLONOSCOPY N/A 12/13/2014   Procedure: COLONOSCOPY;  Surgeon: Manya Silvas, MD;  Location: [facility];  Service: Endoscopy;  Laterality: N/A;   EYE SURGERY  1998   HERNIA REPAIR  1996   left hernia repair   LEFT OOPHORECTOMY Left 2005   multiple complex cysts   llq incisional hernia repair & mini-tummy tuck  07/1994   nasal surgeries     9/87, 9/00, 11/04; deviated septum, ethmoidectomy, rhinoplasty, rhinoplasty revision    There were no vitals filed for this visit.   Subjective Assessment - 03/06/21 0811     Subjective pt reported her posterior area of vagina is irritated. Pt applied Vagisil and it felt fine. There is irritation when peeing. Pt  noticed increased frequency but she also has taken Vitamin B and C which are diuretic.  Pt has not noticed anything falling and feel pressure in the bladder area. Pt was watching  TV in a posture where her pelvis was tucked under. Pt had has no leakage across the past month.    Pertinent History 4 C-sections, L oopherectomy ( multiple benign cycst removed), tummy tuck and inguinal hernia repair ( 1996), multiple skin cancer removed, hermorrhoid surgeries,gall bladder removal. . Diastasis recti present since her laproscopic oopherectomy  Physical activity: Walk 3 mi 2-3 days of walking, pickle ball 3 x week,gym workout: aerobics, step, weight training, Zumba, performed sit ups and crunches in the past .  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 Pt has practiced kegels, squeezing muscles while urinating since 2020.    Patient Stated Goals prevent wearing Depends, decrease neck and LBP                [facility] PT Assessment - 03/06/21 1053       Palpation   Palpation comment increased low abdominal scar restrictions  Pelvic Floor Special Questions - 03/06/21 1053     External Perineal Exam without undergarmetns, redness. irritation at perineum, Standing/ sitting: slightly loweredpelvic organ within introitus. limited upward motion of pelvic floor/ low abdomin coordination, tightenss at pubic symphysis attachments,               [facility] Adult PT Treatment/Exercise - 03/06/21 1055       Neuro Re-ed    Neuro Re-ed Details  cued for less ab straining to promote more upward movement of pelvic floor/ ab      Manual Therapy   Manual therapy comments external techniques at pubic symphysis, supra pubic / abdominal scars fascial mobilizations                         PT Long Term Goals - 03/06/21 1140       PT LONG TERM GOAL #1   Title Pt will decrease her epsiodes of incomplete emptying of bowel  movements from 4 x week to < 2 x week in order to improve hygiene and function    Time 6    Period Weeks    Status On-going      PT LONG TERM GOAL #2   Title Pt will no longer need to go to the toilet "just in case" and only go when she feels the urge to attend community activitiies    Time 8    Period Weeks    Status On-going      PT LONG TERM GOAL #3   Title Pt will be IND with modifications to fitness routine with planks, push ups  to minimzie neck pain    Time 8    Period Weeks    Status On-going      PT LONG TERM GOAL #4   Title Pt will have improved score on Lumbar  FOTO from 73 pts to > 77 pt   in order to improve QOL and have less LBP    Time 8    Period Weeks    Status On-going      PT LONG TERM GOAL #5   Title Pt will demo no diastasis recti and decreased abdominal scar adhesions with proper coordination of deep core mm in order to decrease bloating/ gas  after meals.    Time 10    Period Weeks    Status On-going      PT LONG TERM GOAL #6   Title Pt will demo improved B hip abduction strength from 3/5 to > 4/5 in order to improve pelvic stability to continue playing pickle ball in    Time 10    Period Weeks    Status New      PT LONG TERM GOAL #7   Title Pt will improve FOTO for urinary problem 64 pts to > 69 pt to bowel leakage 58 pts to > 63 pts    Time 10    Period Weeks    Status On-going                   Plan - 03/06/21 0815     Clinical Impression Statement Pt continues to report no more leakage problems for a month now.  Pt was at the beach and came back feeling irritation at perineum area, tingling sensation with urination, increased frequency and pressure feeling at bladder area. Upon external assessment , pt demo'd tightness at anterior pelvic floor mm , slightly lowered pelvic organs (  but still within introitus), and persisting scar restrictions over abdominal scars. Pt demo'd decreased anterior mm tightness post Tx and required cues for  less ab overuse with deep core coordination. Pelvic floor strength was strong, 5 quick reps before fatigue and proper lengthening was demonstrated. Pt was advised to moisturize the perineum and explained suspect irritation may be from sand in bathsuit as pt reported being in the ocean and siting on the sand. Suspect pelvic sensations are due increased slumped sitting in beach chair and while watching TV for many TV shows . Pt was referred to gynecologist. Advised pt to f/u with urine analysis to r/o UTI. Anticipate as pt improves with less ab scar restrictions, pt will demo more upward lift of low abdominal wall where bladder lies and pressure sensation and frequency can improve. Plan to withhold internal assessment until Sx are cleared and possibility of UTI r/o.  Pt continues to benefit from skilled PT.   Examination-Activity Limitations Continence;Toileting    Stability/Clinical Decision Making Evolving/Moderate complexity    Rehab Potential Good    PT Frequency 1x / week    PT Duration Other (comment)    PT Treatment/Interventions Gait training;Neuromuscular re-education;Moist Heat;Therapeutic activities;Functional mobility training;Therapeutic exercise;Taping;Manual techniques;Patient/family education;Traction;Scar mobilization;Energy conservation;Passive range of motion;Balance training;ADLs/Self Care Home Management;Stair training    Consulted and Agree with Plan of Care Patient             Patient will benefit from skilled therapeutic intervention in order to improve the following deficits and impairments:  Decreased coordination, Decreased mobility, Decreased scar mobility, Increased muscle spasms, Decreased strength, Decreased range of motion, Decreased endurance, Decreased activity tolerance, Difficulty walking, Hypomobility, Postural dysfunction, Improper body mechanics, Pain, Impaired flexibility  Visit Diagnosis: Other lack of coordination  Neck pain  Diastasis recti  Chronic  right-sided low back pain without sciatica  Leg length discrepancy     Problem List Patient Active Problem List   Diagnosis Date Noted   Urge incontinence 10/21/2020   Elevated blood pressure reading in office with white coat syndrome, without diagnosis of hypertension 10/21/2020   Arthralgia of both hands 10/21/2020   Urinary frequency 10/21/2020   Perineal irritation in female 01/17/2020   Hyperplastic polyp of large intestine 01/15/2020   Diverticulosis of colon 01/15/2020   Internal hemorrhoid, bleeding 01/15/2020   Chronic migraine without aura without status migrainosus, not intractable 09/21/2019   History of migraine headaches 07/30/2019   Educated about COVID-19 virus infection 01/16/2019   Statin intolerance 08/08/2017   Encounter for preventive health examination 06/03/2017   Hyperlipidemia LDL goal <130 10/16/2016   Anxiety state 10/16/2016   Allergy to iodinated contrast media 10/16/2016    Jerl Mina ,PT, DPT, E-RYT  03/06/2021, 11:41 AM  [facility] [place] [address] [place], [place], [address] Phone: [phone]   Fax:  [phone]  Name: PORTLAND GLANZMAN MRN: [id number] Date of Birth: Jun 22, 1956

## 2021-03-06 NOTE — ED Provider Notes (Addendum)
Subjective:    Brianna Calhoun is a very pleasant 65 y.o. female who presents with concerns for UTI due to urinary frequency and dysuria.  Patient does report some abdominal pressure at times and vaginal itching.  Patient states that she has a history of pelvic floor dysfunction for which she is working with physical therapy.  Patient states that she recently went on a trip to the beach and sometimes wears her pickleball outfit for a little longer after playing.  Patient states that she has used Monistat for the last 3 days, but has not noticed any significant improvement. No unilateral back pain, vomiting, fever, vaginal discharge.  Past medical history, past surgical history, current medications reviewed.  Allergies: is allergic to amoxicillin, rosuvastatin, and contrast media [iodinated diagnostic agents].  Review of Systems See HPI   Objective:     Vitals:   03/06/21 1653  BP: (!) 142/66  Pulse: 78  Resp: 20  Temp: 98.2 F (36.8 C)  SpO2: 100%     General: Appears well-developed and well-nourished. No acute distress.  Cardiovascular: Normal rate  Pulm/Chest: No respiratory distress. Neurological: Alert and oriented to person, place, and time.  Skin: Skin is warm and dry.  Psychiatric: Normal mood, affect, behavior, and thought content.  GU:  Deferred secondary to self collect specimen  Laboratory:  Orders Placed This Encounter  Procedures   Urine Culture   POCT urinalysis dipstick   Results for orders placed or performed during the hospital encounter of 03/06/21  POCT urinalysis dipstick  Result Value Ref Range   Color, UA light yellow (A) yellow   Clarity, UA clear clear   Glucose, UA negative negative mg/dL   Bilirubin, UA negative negative   Ketones, POC UA negative negative mg/dL   Spec Grav, UA <=1.005 (A) 1.010 - 1.025   Blood, UA small (A) negative   pH, UA 5.0 5.0 - 8.0   Protein Ur, POC negative negative mg/dL   Urobilinogen, UA 0.2 0.2 or 1.0 E.U./dL    Nitrite, UA Negative Negative   Leukocytes, UA Small (1+) (A) Negative     Assessment:   1. Vaginal yeast infection - Cervicovaginal ancillary only; Standing - Cervicovaginal ancillary only - fluconazole (DIFLUCAN) 150 MG tablet; Take 1 tablet (150 mg total) by mouth daily for 1 day.  Dispense: 2 tablet; Refill: 0  2. Urinary frequency - POCT urinalysis dipstick; Standing - POCT urinalysis dipstick - Urine Culture; Standing - Urine Culture   Plan:   MDM: Patient presents with concerns for UTI due to urinary frequency and dysuria.  Patient does report some abdominal pressure at times and vaginal itching.  Patient states that she has a history of pelvic floor dysfunction for which she is working with physical therapy.  Patient states that she recently went on a trip to the beach and sometimes wears her pickleball outfit for a little longer after playing.  Patient states that she has used Monistat for the last 3 days, but has not noticed any significant improvement. No unilateral back pain, vomiting, fever, vaginal discharge.  Chart review completed.  Urinalysis reveals light yellow urine, decreased specific gravity, small blood and small leukocytes.  Urine culture pending.  APTIMA swab obtained in clinic today.  Given symptoms, likely vaginal yeast infection.  Low suspicion for UTI, but will treat if urine culture is positive.  Advised to take her first Diflucan today and her second in the next 3 to 5 days if symptoms do not improve.  This  medication has been sent to her pharmacy.  Advised about home treatment and care to prevent yeast infections and advised that if she begins to have any unilateral back pain, fever or vomiting she will need to go to the emergency department.  Patient verbalized understanding and agreed with plan.  Patient stable upon discharge.  Return as needed.    Discharge Instructions      Take your first Diflucan today and your second Diflucan in the next 3 to 5 days  if symptoms do not improve.  A vaginal yeast infection is caused by too many yeast cells in the vagina. This is common in women of all ages. Itching, vaginal discharge and irritation, and other symptoms can bother you. But yeast infections don't often cause other health problems.  Some medicines can increase your risk of getting a yeast infection. These include antibiotics, birth control pills, hormones, and steroids. You may also be more likely to get a yeast infection if you are pregnant, have diabetes, douche, or wear tight clothes.  With treatment, most yeast infections get better in 2 to 3 days.  Do not use tampons while using a vaginal cream or suppository. The tampons can absorb the medicine. Use pads instead. Wear loose cotton clothing. Do not wear nylon or other fabric that holds body heat and moisture close to the skin. Try sleeping without underwear. Do not scratch. Relieve itching with a cold pack or a cool bath. Do not wash your vaginal area more than once a day. Use plain water or a mild, unscented soap. Air-dry the vaginal area. Change out of wet swimsuits after swimming. Do not have sex until you have finished your treatment. Do not douche  Return to clinic if you have unexpected vaginal bleeding or if there is new or increased pain in your pelvis. Follow-up with your gynecologist if you have symptoms that return after treatment or if you have recurrent infections.    Your urine sample today was sent for a culture.  If your urine indicates that you have an infection in the urine, we will call you to initiate antibiotic therapy.  If you do not receive a call in the next 3-4 days, it means that you do not need antibiotics for a urinary tract infection.  Please go to the ER for continued nausea, vomiting, unilateral back pain, or worsening urinary symptoms.          Serafina Royals, [place] 03/06/21 [place], [place], [place] 03/06/21 1724

## 2021-03-06 NOTE — ED Triage Notes (Signed)
Pt here with frequency, burning both while urinating and when not, slight discharge, and abdominal pressure.

## 2021-03-06 NOTE — Patient Instructions (Addendum)
PELVIC FLOOR / KEGEL EXERCISES   Pelvic floor/ Kegel exercises are used to strengthen the muscles in the base of your pelvis that are responsible for supporting your pelvic organs and preventing urine/feces leakage. Based on your therapist's recommendations, they can be performed while standing, sitting, or lying down. Imagine pelvic floor area as a diamond with pelvic landmarks: top =pubic bone, bottom tip=tailbone, sides=sitting bones (ischial tuberosities).    Make yourself aware of this muscle group by using these cues while coordinating your breath: Inhale, feel pelvic floor diamond area lower like hammock towards your feet and ribcage/belly expanding. Pause. Let the exhale naturally and feel your belly sink, abdominal muscles hugging in around you and you may notice the pelvic diamond draws upward towards your head forming a umbrella shape. Give a squeeze during the exhalation like you are stopping the flow of urine. If you are squeezing the buttock muscles, try to give 50% less effort.   Common Errors: Breath holding: If you are holding your breath, you may be bearing down against your bladder instead of pulling it up. If you belly bulges up while you are squeezing, you are holding your breath. Be sure to breathe gently in and out while exercising. Counting out loud may help you avoid holding your breath. Accessory muscle use: You should not see or feel other muscle movement when performing pelvic floor exercises. When done properly, no one can tell that you are performing the exercises. Keep the buttocks, belly and inner thighs relaxed. Overdoing it: Your muscles can fatigue and stop working for you if you over-exercise. You may actually leak more or feel soreness at the lower abdomen or rectum.  YOUR HOME EXERCISE PROGRAM   SHORT HOLDS: Position: on back with pillow under hips  Inhale and then exhale. Then squeeze the muscle.  (Be sure to let belly sink in with exhales and not push  outward) Perform 5 repetitions, 3 different  Times/day on your back  ( morning 5 breaths of deep core, 5 quicks and then proceed with deep core level 2- 6 min without contractions   Midday on your back , 5 quick    ( morning 5 breaths of deep core, 5 quicks and then proceed with deep core level 2- 6 min without contractions                         DECREASE DOWNWARD PRESSURE ON  YOUR PELVIC FLOOR, ABDOMINAL, LOW BACK MUSCLES       PRESERVE YOUR PELVIC HEALTH LONG-TERM   ** SQUEEZE pelvic floor BEFORE YOUR SNEEZE, COUGH, LAUGH   ** EXHALE BEFORE YOU RISE AGAINST GRAVITY (lifting, sit to stand, from squat to stand)   ** LOG ROLL OUT OF BED INSTEAD OF CRUNCH/SIT-UP    __  Apply coconut oil or olive oil at perineum for skin relief after showering  Gynecologist referral : Dr. Leafy Ro

## 2021-03-08 LAB — CERVICOVAGINAL ANCILLARY ONLY
Bacterial Vaginitis (gardnerella): NEGATIVE
Candida Glabrata: NEGATIVE
Candida Vaginitis: NEGATIVE
Chlamydia: NEGATIVE
Comment: NEGATIVE
Comment: NEGATIVE
Comment: NEGATIVE
Comment: NEGATIVE
Comment: NEGATIVE
Comment: NORMAL
Neisseria Gonorrhea: NEGATIVE
Trichomonas: NEGATIVE

## 2021-03-08 LAB — URINE CULTURE: Culture: NO GROWTH

## 2021-03-15 ENCOUNTER — Ambulatory Visit: Payer: Medicare Other | Admitting: Physical Therapy

## 2021-03-16 ENCOUNTER — Other Ambulatory Visit: Payer: Self-pay

## 2021-03-16 ENCOUNTER — Ambulatory Visit (INDEPENDENT_AMBULATORY_CARE_PROVIDER_SITE_OTHER): Payer: Medicare Other | Admitting: Dermatology

## 2021-03-16 DIAGNOSIS — L578 Other skin changes due to chronic exposure to nonionizing radiation: Secondary | ICD-10-CM

## 2021-03-16 DIAGNOSIS — L814 Other melanin hyperpigmentation: Secondary | ICD-10-CM | POA: Diagnosis not present

## 2021-03-16 DIAGNOSIS — L570 Actinic keratosis: Secondary | ICD-10-CM

## 2021-03-16 DIAGNOSIS — L820 Inflamed seborrheic keratosis: Secondary | ICD-10-CM | POA: Diagnosis not present

## 2021-03-16 DIAGNOSIS — Z1283 Encounter for screening for malignant neoplasm of skin: Secondary | ICD-10-CM

## 2021-03-16 DIAGNOSIS — Z85828 Personal history of other malignant neoplasm of skin: Secondary | ICD-10-CM | POA: Diagnosis not present

## 2021-03-16 DIAGNOSIS — D1800 Hemangioma unspecified site: Secondary | ICD-10-CM

## 2021-03-16 DIAGNOSIS — L821 Other seborrheic keratosis: Secondary | ICD-10-CM

## 2021-03-16 DIAGNOSIS — D229 Melanocytic nevi, unspecified: Secondary | ICD-10-CM

## 2021-03-16 NOTE — Progress Notes (Signed)
Follow-Up Visit   Subjective  Brianna Calhoun is a 65 y.o. female who presents for the following: Annual Exam (History of BCC - TBSE today). The patient presents for Total-Body Skin Exam (TBSE) for skin cancer screening and mole check.  The following portions of the chart were reviewed this encounter and updated as appropriate:   Tobacco  Allergies  Meds  Problems  Med Hx  Surg Hx  Fam Hx     Review of Systems:  No other skin or systemic complaints except as noted in HPI or Assessment and Plan.  Objective  Well appearing patient in no apparent distress; mood and affect are within normal limits.  A full examination was performed including scalp, head, eyes, ears, nose, lips, neck, chest, axillae, abdomen, back, buttocks, bilateral upper extremities, bilateral lower extremities, hands, feet, fingers, toes, fingernails, and toenails. All findings within normal limits unless otherwise noted below.  Face x 3, right forearm x 1 - Total 4 (4) Erythematous keratotic or waxy stuck-on papule or plaque.   Face x 10, chest x 2 (12) Erythematous thin papules/macules with gritty scale.    Assessment & Plan   Lentigines - Scattered tan macules - Due to sun exposure - Benign-appering, observe - Recommend daily broad spectrum sunscreen SPF 30+ to sun-exposed areas, reapply every 2 hours as needed. - Call for any changes  Seborrheic Keratoses - Stuck-on, waxy, tan-brown papules and/or plaques  - Benign-appearing - Discussed benign etiology and prognosis. - Observe - Call for any changes  Melanocytic Nevi - Tan-brown and/or pink-flesh-colored symmetric macules and papules - Benign appearing on exam today - Observation - Call clinic for new or changing moles - Recommend daily use of broad spectrum spf 30+ sunscreen to sun-exposed areas.   Hemangiomas - Red papules - Discussed benign nature - Observe - Call for any changes  Actinic Damage - Chronic condition, secondary to  cumulative UV/sun exposure - diffuse scaly erythematous macules with underlying dyspigmentation - Recommend daily broad spectrum sunscreen SPF 30+ to sun-exposed areas, reapply every 2 hours as needed.  - Staying in the shade or wearing long sleeves, sun glasses (UVA+UVB protection) and wide brim hats (4-inch brim around the entire circumference of the hat) are also recommended for sun protection.  - Call for new or changing lesions.  Skin cancer screening performed today.  Inflamed seborrheic keratosis Face x 3, right forearm x 1 - Total 4  Destruction of lesion - Face x 3, right forearm x 1 - Total 4 Complexity: simple   Destruction method: cryotherapy   Informed consent: discussed and consent obtained   Timeout:  patient name, date of birth, surgical site, and procedure verified Lesion destroyed using liquid nitrogen: Yes   Region frozen until ice ball extended beyond lesion: Yes   Outcome: patient tolerated procedure well with no complications   Post-procedure details: wound care instructions given    AK (actinic keratosis) (12) Face x 10, chest x 2  Destruction of lesion - Face x 10, chest x 2 Complexity: simple   Destruction method: cryotherapy   Informed consent: discussed and consent obtained   Timeout:  patient name, date of birth, surgical site, and procedure verified Lesion destroyed using liquid nitrogen: Yes   Region frozen until ice ball extended beyond lesion: Yes   Outcome: patient tolerated procedure well with no complications   Post-procedure details: wound care instructions given    Skin cancer screening  Return in about 6 months (around 09/13/2021) for Follow up.  I, Ashok Cordia, CMA, am acting as scribe for Sarina Ser, MD . Documentation: I have reviewed the above documentation for accuracy and completeness, and I agree with the above.  Sarina Ser, MD

## 2021-03-16 NOTE — Patient Instructions (Signed)

## 2021-03-21 ENCOUNTER — Encounter: Payer: Self-pay | Admitting: Dermatology

## 2021-03-22 ENCOUNTER — Ambulatory Visit: Payer: Medicare Other | Admitting: Physical Therapy

## 2021-04-12 ENCOUNTER — Other Ambulatory Visit: Payer: Self-pay

## 2021-04-12 ENCOUNTER — Ambulatory Visit: Payer: Medicare Other | Attending: Internal Medicine | Admitting: Physical Therapy

## 2021-04-12 DIAGNOSIS — R278 Other lack of coordination: Secondary | ICD-10-CM | POA: Insufficient documentation

## 2021-04-12 DIAGNOSIS — M542 Cervicalgia: Secondary | ICD-10-CM | POA: Diagnosis present

## 2021-04-12 DIAGNOSIS — M533 Sacrococcygeal disorders, not elsewhere classified: Secondary | ICD-10-CM | POA: Diagnosis present

## 2021-04-12 DIAGNOSIS — G8929 Other chronic pain: Secondary | ICD-10-CM | POA: Diagnosis present

## 2021-04-12 DIAGNOSIS — M2170 Unequal limb length (acquired), unspecified site: Secondary | ICD-10-CM | POA: Insufficient documentation

## 2021-04-12 DIAGNOSIS — M6208 Separation of muscle (nontraumatic), other site: Secondary | ICD-10-CM | POA: Diagnosis present

## 2021-04-12 DIAGNOSIS — M545 Low back pain, unspecified: Secondary | ICD-10-CM | POA: Insufficient documentation

## 2021-04-13 NOTE — Therapy (Signed)
[place] MAIN [facility] SERVICES [address] [place], [place], [address] Phone: [phone]   Fax:  [phone]  Physical Therapy Treatment  Patient Details  Name: Brianna Calhoun MRN: [id number] Date of Birth: 1955-08-23 Referring Provider (PT): Derrel Nip   Encounter Date: 04/12/2021   PT End of Session - 04/12/21 1112     Visit Number 8    Number of Visits 10    Date for PT Re-Evaluation 06/21/21   eval 6/28   PT Start Time 1110    PT Stop Time 9528    PT Time Calculation (min) 55 min    Activity Tolerance Patient tolerated treatment well;No increased pain    Behavior During Therapy [facility] for tasks assessed/performed             Past Medical History:  Diagnosis Date   Actinic keratosis    Basal cell carcinoma 04/13/2008   Right temple   Basal cell carcinoma 10/07/2019   Right chest   Basal cell carcinoma (BCC) of face 03/08/2009   Right temple, recurrent   Basal cell carcinoma (BCC) of face 11/28/2015   Right temple, hairline. Nodular pattern   Basal cell carcinoma of trunk 03/29/2014   Right paraspinal upper back. Superficial    Cancer ([place])    skin   GERD (gastroesophageal reflux disease)    Hyperlipidemia    Migraine     Past Surgical History:  Procedure Laterality Date   CERVICAL CERCLAGE     6/84, 1/90, 8/93 under epidurals   [place], [place]   COLONOSCOPY N/A 12/13/2014   Procedure: COLONOSCOPY;  Surgeon: Manya Silvas, MD;  Location: [place];  Service: Endoscopy;  Laterality: N/A;   EYE SURGERY  1998   HERNIA REPAIR  1996   left hernia repair   LEFT OOPHORECTOMY Left 2005   multiple complex cysts   llq incisional hernia repair & mini-tummy tuck  07/1994   nasal surgeries     9/87, 9/00, 11/04; deviated septum, ethmoidectomy, rhinoplasty, rhinoplasty revision    There were no vitals filed for this visit.   Subjective Assessment - 04/12/21 1113      Subjective Pt noticed her R hip pain has been gone for the past 2 weeks. Pt is carrying her grandkids alota and feels it affects her neck and back. Pt is still having urinary leakage unless she is overful. Pt has some bowel smearing after having a bowel movement once a week on average. Pt thinks it has to do with flax seed.    Pertinent History 4 C-sections, L oopherectomy ( multiple benign cycst removed), tummy tuck and inguinal hernia repair ( 1996), multiple skin cancer removed, hermorrhoid surgeries,gall bladder removal. . Diastasis recti present since her laproscopic oopherectomy  Physical activity: Walk 3 mi 2-3 days of walking, pickle ball 3 x week,gym workout: aerobics, step, weight training, Zumba, performed sit ups and crunches in the past .  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 Pt has practiced kegels, squeezing muscles while urinating since 2020.    Patient Stated Goals prevent wearing Depends, decrease neck and LBP                [facility] PT Assessment - 04/13/21 1301       Coordination   Coordination and Movement Description posterior tilt of pelvis  Pelvic Floor Special Questions - 04/13/21 1257     Pelvic Floor Internal Exam pt consented verbally , had no contraindications    Exam Type Rectal    Palpation tightness left and posterior ( Hx of banding), tight EAS               [facility] Adult PT Treatment/Exercise - 04/13/21 1258       Therapeutic Activites    Other Therapeutic Activities reassessed goals, discussed and guided relaxation practices      Neuro Re-ed    Neuro Re-ed Details  cued for relaxation practices      Modalities   Modalities Moist Heat      Moist Heat Therapy   Number Minutes Moist Heat 5 Minutes    Moist Heat Location --   sacrum     Manual Therapy   Internal Pelvic Floor STM/MWM at problem areas noted in assessment                          PT  Long Term Goals - 04/12/21 1115       PT LONG TERM GOAL #1   Title Pt will decrease her epsiodes of incomplete emptying of bowel movements from 4 x week to < 2 x week in order to improve hygiene and function    Time 6    Period Weeks    Status Achieved      PT LONG TERM GOAL #2   Title Pt will no longer need to go to the toilet "just in case" and only go when she feels the urge to attend community activitiies    Time 8    Period Weeks    Status Achieved      PT LONG TERM GOAL #3   Title Pt will be IND with modifications to fitness routine with planks, push ups  to minimzie neck pain    Time 8    Period Weeks    Status On-going      PT LONG TERM GOAL #4   Title Pt will have improved score on Lumbar  FOTO from 73 pts to > 77 pt   in order to improve QOL and have less LBP    Time 8    Period Weeks    Status On-going      PT LONG TERM GOAL #5   Title Pt will demo no diastasis recti and decreased abdominal scar adhesions with proper coordination of deep core mm in order to decrease bloating/ gas  after meals.    Time 10    Period Weeks    Status Achieved      PT LONG TERM GOAL #6   Title Pt will demo improved B hip abduction strength from 3/5 to > 4/5 in order to improve pelvic stability to continue playing pickle ball in  ( 04/12/21: 5/5 B)    Time 10    Period Weeks    Status Achieved      PT LONG TERM GOAL #7   Title Pt will improve FOTO for urinary problem 64 pts to > 69 pt to bowel leakage 58 pts to > 63 pts    Time 10    Period Weeks    Status On-going                   Plan - 04/12/21 1113     Clinical Impression Statement Pt has achieved 4/7 goals with resolved DRA,  increased hip abduction strength, improved complete emptying of bowel movements, and improved FOTO lumbar score.   Pt has demo'd better aligned spinal and pelvic girdle alignment and less forward head posture. Deep core strength is improving. Pt will need more strengthening of posterior chain  because pt has to pick up grandchildren as she cares for them during the week. Pt was educate don body mechanics to minimize risk for prolapse.   Today, pt demo'd increased posterior pelvic floor mm tightness which decreased post Tx. Pt required cues for less posterior tilt of pelvis to lengthen pelvic floor. Guided relaxation practices to optimize lengthening of pelvic floor mm, especially during times of stress which pt reported she was feeling stressed. Pt continues to benefit from skilled PT to achieve remaining goals.    Examination-Activity Limitations Continence;Toileting    Stability/Clinical Decision Making Evolving/Moderate complexity    Rehab Potential Good    PT Frequency 1x / week    PT Duration Other (comment)    PT Treatment/Interventions Gait training;Neuromuscular re-education;Moist Heat;Therapeutic activities;Functional mobility training;Therapeutic exercise;Taping;Manual techniques;Patient/family education;Traction;Scar mobilization;Energy conservation;Passive range of motion;Balance training;ADLs/Self Care Home Management;Stair training    Consulted and Agree with Plan of Care Patient             Patient will benefit from skilled therapeutic intervention in order to improve the following deficits and impairments:  Decreased coordination, Decreased mobility, Decreased scar mobility, Increased muscle spasms, Decreased strength, Decreased range of motion, Decreased endurance, Decreased activity tolerance, Difficulty walking, Hypomobility, Postural dysfunction, Improper body mechanics, Pain, Impaired flexibility  Visit Diagnosis: Other lack of coordination  Sacrococcygeal disorders, not elsewhere classified  Neck pain  Diastasis recti  Chronic right-sided low back pain without sciatica  Leg length discrepancy     Problem List Patient Active Problem List   Diagnosis Date Noted   Urge incontinence 10/21/2020   Elevated blood pressure reading in office with white  coat syndrome, without diagnosis of hypertension 10/21/2020   Arthralgia of both hands 10/21/2020   Urinary frequency 10/21/2020   Perineal irritation in female 01/17/2020   Hyperplastic polyp of large intestine 01/15/2020   Diverticulosis of colon 01/15/2020   Internal hemorrhoid, bleeding 01/15/2020   Chronic migraine without aura without status migrainosus, not intractable 09/21/2019   History of migraine headaches 07/30/2019   Educated about COVID-19 virus infection 01/16/2019   Statin intolerance 08/08/2017   Encounter for preventive health examination 06/03/2017   Hyperlipidemia LDL goal <130 10/16/2016   Anxiety state 10/16/2016   Allergy to iodinated contrast media 10/16/2016    Jerl Mina, PT 04/13/2021, 1:01 PM  [place] MAIN [facility] SERVICES [address] [place], [place], [address] Phone: [phone]   Fax:  [phone]  Name: Brianna Calhoun MRN: [id number] Date of Birth: 1956-04-18

## 2021-04-26 ENCOUNTER — Ambulatory Visit: Payer: Medicare Other | Admitting: Physical Therapy

## 2021-04-26 ENCOUNTER — Other Ambulatory Visit: Payer: Self-pay

## 2021-04-26 DIAGNOSIS — R278 Other lack of coordination: Secondary | ICD-10-CM

## 2021-04-26 DIAGNOSIS — G8929 Other chronic pain: Secondary | ICD-10-CM

## 2021-04-26 DIAGNOSIS — M542 Cervicalgia: Secondary | ICD-10-CM

## 2021-04-26 DIAGNOSIS — M545 Low back pain, unspecified: Secondary | ICD-10-CM

## 2021-04-26 DIAGNOSIS — M6208 Separation of muscle (nontraumatic), other site: Secondary | ICD-10-CM

## 2021-04-26 NOTE — Therapy (Signed)
[place] MAIN [facility] SERVICES [address] [place], [place], [address] Phone: [phone]   Fax:  [phone]  Physical Therapy Treatment  Patient Details  Name: Brianna Calhoun MRN: [id number] Date of Birth: 26-Mar-1956 Referring Provider (PT): Derrel Nip   Encounter Date: 04/26/2021   PT End of Session - 04/26/21 1110     Visit Number 9    Number of Visits 10    Date for PT Re-Evaluation 06/21/21   eval 6/28   PT Start Time 1104    PT Stop Time 1200    PT Time Calculation (min) 56 min    Activity Tolerance Patient tolerated treatment well;No increased pain    Behavior During Therapy [facility] for tasks assessed/performed             Past Medical History:  Diagnosis Date   Actinic keratosis    Basal cell carcinoma 04/13/2008   Right temple   Basal cell carcinoma 10/07/2019   Right chest   Basal cell carcinoma (BCC) of face 03/08/2009   Right temple, recurrent   Basal cell carcinoma (BCC) of face 11/28/2015   Right temple, hairline. Nodular pattern   Basal cell carcinoma of trunk 03/29/2014   Right paraspinal upper back. Superficial    Cancer ([place])    skin   GERD (gastroesophageal reflux disease)    Hyperlipidemia    Migraine     Past Surgical History:  Procedure Laterality Date   CERVICAL CERCLAGE     6/84, 1/90, 8/93 under epidurals   [place], [place]   COLONOSCOPY N/A 12/13/2014   Procedure: COLONOSCOPY;  Surgeon: Manya Silvas, MD;  Location: [place];  Service: Endoscopy;  Laterality: N/A;   EYE SURGERY  1998   HERNIA REPAIR  1996   left hernia repair   LEFT OOPHORECTOMY Left 2005   multiple complex cysts   llq incisional hernia repair & mini-tummy tuck  07/1994   nasal surgeries     9/87, 9/00, 11/04; deviated septum, ethmoidectomy, rhinoplasty, rhinoplasty revision    There were no vitals filed for this visit.   Subjective Assessment - 04/26/21 1111      Subjective Pt noticed no R hip pain.  Leakage is better. No more fecal smearing    Pertinent History 4 C-sections, L oopherectomy ( multiple benign cycst removed), tummy tuck and inguinal hernia repair ( 1996), multiple skin cancer removed, hermorrhoid surgeries,gall bladder removal. . Diastasis recti present since her laproscopic oopherectomy  Physical activity: Walk 3 mi 2-3 days of walking, pickle ball 3 x week,gym workout: aerobics, step, weight training, Zumba, performed sit ups and crunches in the past .  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 Pt has practiced kegels, squeezing muscles while urinating since 2020.    Patient Stated Goals prevent wearing Depends, decrease neck and LBP                            Pelvic Floor Special Questions - 04/26/21 1131     External Perineal Exam --    Pelvic Floor Internal Exam pt consented verbally , had no contraindications    Exam Type Rectal    Palpation tightness 6 clock , no tightness at EAS    Strength --   overactivity of posetrior mm , withholing long holds,  Vaginal internal exam:  Slightly lowered bladder behind introitus,   Cued for anterior tilt, upward movement of bladder   Long hold contraction with overuse of adductors/ gluts      [facility] Adult PT Treatment/Exercise - 04/26/21 1343       Therapeutic Activites    Other Therapeutic Activities reassessed goals, discussed and guided relaxation practices      Neuro Re-ed    Neuro Re-ed Details  cued for less posterior activation in seated quick contractions, and long holds, reviewed HEP      Modalities   Modalities Moist Heat      Moist Heat Therapy   Moist Heat Location --   sacrum     Manual Therapy   Internal Pelvic Floor STM/MWM at problem areas noted in assessment                          PT Long Term Goals - 04/26/21 1112       PT LONG TERM GOAL #1   Title Pt will decrease  her epsiodes of incomplete emptying of bowel movements from 4 x week to < 2 x week in order to improve hygiene and function    Time 6    Period Weeks    Status Achieved      PT LONG TERM GOAL #2   Title Pt will no longer need to go to the toilet "just in case" and only go when she feels the urge to attend community activitiies    Time 8    Period Weeks    Status Achieved      PT LONG TERM GOAL #3   Title Pt will be IND with modifications to fitness routine with planks, push ups  to minimzie neck pain    Time 8    Period Weeks    Status Achieved      PT LONG TERM GOAL #4   Title Pt will have improved score on Lumbar  FOTO from 73 pts to > 77 pt   in order to improve QOL and have less LBP ( 04/12/21: 83 pts)    Time 8    Period Weeks    Status Achieved      PT LONG TERM GOAL #5   Title Pt will demo no diastasis recti and decreased abdominal scar adhesions with proper coordination of deep core mm in order to decrease bloating/ gas  after meals.    Time 10    Period Weeks    Status Achieved      PT LONG TERM GOAL #6   Title Pt will demo improved B hip abduction strength from 3/5 to > 4/5 in order to improve pelvic stability to continue playing pickle ball in  ( 04/12/21: 5/5 B)    Time 10    Period Weeks    Status Achieved      PT LONG TERM GOAL #7   Title Pt will improve FOTO for urinary problem 64 pts to > 69 pt to bowel leakage 58 pts to > 63 pts  ( 10/19: urinary 70 pts)  and ( bowel leakage: 64 pts    Time 10    Period Weeks    Status Achieved                   Plan - 04/26/21 1111     Clinical Impression Statement Pt is maintaining fecal and urinary continence with her activities which include  lifting grandchildren and pickleball playing. Pt 's posture and alignments have been maintained.  Pt is not appropriate for endurance pelvic floor contraction training as pt demo'd tendency to overtighten with overuse of adductor mm. Thus,  withholding endurance pelvic  floor exercises into HEP. Progressed pt to seated quick contractions but required cues for less posterior mm overuse. Internal rectal and vaginal assessments much less tightness and pt is able to demo proper lengthening of pelvic floor. Provided cues for HEP to maintain. Pt is close to d/c but needs more time to practice on her own. Next scheduled appt in a few weeks.      Examination-Activity Limitations Continence;Toileting    Stability/Clinical Decision Making Evolving/Moderate complexity    Rehab Potential Good    PT Frequency 1x / week    PT Duration Other (comment)    PT Treatment/Interventions Gait training;Neuromuscular re-education;Moist Heat;Therapeutic activities;Functional mobility training;Therapeutic exercise;Taping;Manual techniques;Patient/family education;Traction;Scar mobilization;Energy conservation;Passive range of motion;Balance training;ADLs/Self Care Home Management;Stair training    Consulted and Agree with Plan of Care Patient             Patient will benefit from skilled therapeutic intervention in order to improve the following deficits and impairments:  Decreased coordination, Decreased mobility, Decreased scar mobility, Increased muscle spasms, Decreased strength, Decreased range of motion, Decreased endurance, Decreased activity tolerance, Difficulty walking, Hypomobility, Postural dysfunction, Improper body mechanics, Pain, Impaired flexibility  Visit Diagnosis: Other lack of coordination  Neck pain  Diastasis recti  Chronic right-sided low back pain without sciatica     Problem List Patient Active Problem List   Diagnosis Date Noted   Urge incontinence 10/21/2020   Elevated blood pressure reading in office with white coat syndrome, without diagnosis of hypertension 10/21/2020   Arthralgia of both hands 10/21/2020   Urinary frequency 10/21/2020   Perineal irritation in female 01/17/2020   Hyperplastic polyp of large intestine 01/15/2020    Diverticulosis of colon 01/15/2020   Internal hemorrhoid, bleeding 01/15/2020   Chronic migraine without aura without status migrainosus, not intractable 09/21/2019   History of migraine headaches 07/30/2019   Educated about COVID-19 virus infection 01/16/2019   Statin intolerance 08/08/2017   Encounter for preventive health examination 06/03/2017   Hyperlipidemia LDL goal <130 10/16/2016   Anxiety state 10/16/2016   Allergy to iodinated contrast media 10/16/2016    Jerl Mina, PT 04/26/2021, 1:53 PM  [place] MAIN [facility] SERVICES [address] [place], [place], [address] Phone: [phone]   Fax:  [phone]  Name: Brianna Calhoun MRN: [id number] Date of Birth: 04-Aug-1955

## 2021-04-26 NOTE — Patient Instructions (Addendum)
Quick squeeze ( middle at perineum not too much at anal muscles)  5 reps, at each meal time ( 3 different times of the day)  __  Sidelying clams  20 reps withi figure 4 stretch  __ Deep core lvel 1-2 ( 2 x day)   __ Mini squat with activities  __  Dolphin plank and neck exercises   __  Pelvic stretches

## 2021-05-17 ENCOUNTER — Ambulatory Visit (INDEPENDENT_AMBULATORY_CARE_PROVIDER_SITE_OTHER): Payer: Medicare Other | Admitting: Neurology

## 2021-05-17 DIAGNOSIS — G43709 Chronic migraine without aura, not intractable, without status migrainosus: Secondary | ICD-10-CM

## 2021-05-17 NOTE — Progress Notes (Signed)
Consent Form Botulism Toxin Injection For Chronic Migraine   Interval history May 17, 2021: Still doing great, greater than 80% improvement in frequency of headaches and migraines.  Reviewed orally with patient, additionally signature is on file:  Botulism toxin has been approved by the Federal drug administration for treatment of chronic migraine. Botulism toxin does not cure chronic migraine and it may not be effective in some patients.  The administration of botulism toxin is accomplished by injecting a small amount of toxin into the muscles of the neck and head. Dosage must be titrated for each individual. Any benefits resulting from botulism toxin tend to wear off after 3 months with a repeat injection required if benefit is to be maintained. Injections are usually done every 3-4 months with maximum effect peak achieved by about 2 or 3 weeks. Botulism toxin is expensive and you should be sure of what costs you will incur resulting from the injection.  The side effects of botulism toxin use for chronic migraine may include:   -Transient, and usually mild, facial weakness with facial injections  -Transient, and usually mild, head or neck weakness with head/neck injections  -Reduction or loss of forehead facial animation due to forehead muscle weakness  -Eyelid drooping  -Dry eye  -Pain at the site of injection or bruising at the site of injection  -Double vision  -Potential unknown long term risks  Contraindications: You should not have Botox if you are pregnant, nursing, allergic to albumin, have an infection, skin condition, or muscle weakness at the site of the injection, or have myasthenia gravis, Lambert-Eaton syndrome, or ALS.  It is also possible that as with any injection, there may be an allergic reaction or no effect from the medication. Reduced effectiveness after repeated injections is sometimes seen and rarely infection at the injection site may occur. All care will be  taken to prevent these side effects. If therapy is given over a long time, atrophy and wasting in the muscle injected may occur. Occasionally the patient's become refractory to treatment because they develop antibodies to the toxin. In this event, therapy needs to be modified.  I have read the above information and consent to the administration of botulism toxin.    BOTOX PROCEDURE NOTE FOR MIGRAINE HEADACHE    Contraindications and precautions discussed with patient(above). Aseptic procedure was observed and patient tolerated procedure. Procedure performed by Dr. Georgia Dom  The condition has existed for more than 6 months, and pt does not have a diagnosis of ALS, Myasthenia Gravis or Lambert-Eaton Syndrome.  Risks and benefits of injections discussed and pt agrees to proceed with the procedure.  Written consent obtained  These injections are medically necessary. Pt  receives good benefits from these injections. These injections do not cause sedations or hallucinations which the oral therapies may cause.  Description of procedure:  The patient was placed in a sitting position. The standard protocol was used for Botox as follows, with 5 units of Botox injected at each site:   -Procerus muscle, midline injection  -Corrugator muscle, bilateral injection  -Frontalis muscle, bilateral injection, with 2 sites each side, medial injection was performed in the upper one third of the frontalis muscle, in the region vertical from the medial inferior edge of the superior orbital rim. The lateral injection was again in the upper one third of the forehead vertically above the lateral limbus of the cornea, 1.5 cm lateral to the medial injection site.  -Temporalis muscle injection, 4 sites, bilaterally. The first  injection was 3 cm above the tragus of the ear, second injection site was 1.5 cm to 3 cm up from the first injection site in line with the tragus of the ear. The third injection site was 1.5-3  cm forward between the first 2 injection sites. The fourth injection site was 1.5 cm posterior to the second injection site.   -Occipitalis muscle injection, 3 sites, bilaterally. The first injection was done one half way between the occipital protuberance and the tip of the mastoid process behind the ear. The second injection site was done lateral and superior to the first, 1 fingerbreadth from the first injection. The third injection site was 1 fingerbreadth superiorly and medially from the first injection site.  -Cervical paraspinal muscle injection, 2 sites, bilateral knee first injection site was 1 cm from the midline of the cervical spine, 3 cm inferior to the lower border of the occipital protuberance. The second injection site was 1.5 cm superiorly and laterally to the first injection site.  -Trapezius muscle injection was performed at 3 sites, bilaterally. The first injection site was in the upper trapezius muscle halfway between the inflection point of the neck, and the acromion. The second injection site was one half way between the acromion and the first injection site. The third injection was done between the first injection site and the inflection point of the neck.   Will return for repeat injection in 3 months.   155 units of Botox was used, 45U Botox not injected was wasted. The patient tolerated the procedure well, there were no complications of the above procedure.

## 2021-05-17 NOTE — Progress Notes (Signed)
Botox- 200 units x 1 vial Lot: [id number] Expiration: 10/2023 NDC: 3374-4514-60  Bacteriostatic 0.9% Sodium Chloride- 25mL total Lot: [id number] Expiration:07/09/2022 NDC: 2158-7276-18  Dx: M85.927  B/B

## 2021-05-24 ENCOUNTER — Other Ambulatory Visit: Payer: Self-pay

## 2021-05-24 ENCOUNTER — Ambulatory Visit (INDEPENDENT_AMBULATORY_CARE_PROVIDER_SITE_OTHER): Payer: Medicare Other | Admitting: Dermatology

## 2021-05-24 DIAGNOSIS — L578 Other skin changes due to chronic exposure to nonionizing radiation: Secondary | ICD-10-CM | POA: Diagnosis not present

## 2021-05-24 DIAGNOSIS — L820 Inflamed seborrheic keratosis: Secondary | ICD-10-CM

## 2021-05-24 DIAGNOSIS — D485 Neoplasm of uncertain behavior of skin: Secondary | ICD-10-CM

## 2021-05-24 DIAGNOSIS — C44529 Squamous cell carcinoma of skin of other part of trunk: Secondary | ICD-10-CM | POA: Diagnosis not present

## 2021-05-24 DIAGNOSIS — L570 Actinic keratosis: Secondary | ICD-10-CM

## 2021-05-24 DIAGNOSIS — C4492 Squamous cell carcinoma of skin, unspecified: Secondary | ICD-10-CM

## 2021-05-24 HISTORY — DX: Squamous cell carcinoma of skin, unspecified: C44.92

## 2021-05-24 MED ORDER — FLUOROURACIL 5 % EX CREA: TOPICAL_CREAM | Freq: Two times a day (BID) | CUTANEOUS | 1 refills | Status: DC

## 2021-05-24 NOTE — Patient Instructions (Signed)
Wound Care Instructions  Cleanse wound gently with soap and water once a day then pat dry with clean gauze. Apply a thing coat of Petrolatum (petroleum jelly, "Vaseline") over the wound (unless you have an allergy to this). We recommend that you use a new, sterile tube of Vaseline. Do not pick or remove scabs. Do not remove the yellow or white "healing tissue" from the base of the wound.  Cover the wound with fresh, clean, nonstick gauze and secure with paper tape. You may use Band-Aids in place of gauze and tape if the would is small enough, but would recommend trimming much of the tape off as there is often too much. Sometimes Band-Aids can irritate the skin.  You should call the office for your biopsy report after 1 week if you have not already been contacted.  If you experience any problems, such as abnormal amounts of bleeding, swelling, significant bruising, significant pain, or evidence of infection, please call the office immediately.  FOR ADULT SURGERY PATIENTS: If you need something for pain relief you may take 1 extra strength Tylenol (acetaminophen) AND 2 Ibuprofen (200mg  each) together every 4 hours as needed for pain. (do not take these if you are allergic to them or if you have a reason you should not take them.) Typically, you may only need pain medication for 1 to 3 days.     Cryotherapy Aftercare  Wash gently with soap and water everyday.   Apply Vaseline and Band-Aid daily until healed.    If You Need Anything After Your Visit  If you have any questions or concerns for your doctor, please call our main line at [phone] and press option 4 to reach your doctor's medical assistant. If no one answers, please leave a voicemail as directed and we will return your call as soon as possible. Messages left after 4 pm will be answered the following business day.   You may also send [place] a message via [place]. We typically respond to MyChart messages within 1-2 business days.  For  prescription refills, please ask your pharmacy to contact our office. Our fax number is [phone].  If you have an urgent issue when the clinic is closed that cannot wait until the next business day, you can page your doctor at the number below.    Please note that while we do our best to be available for urgent issues outside of office hours, we are not available 24/7.   If you have an urgent issue and are unable to reach [place], you may choose to seek medical care at your doctor's office, retail clinic, urgent care center, or emergency room.  If you have a medical emergency, please immediately call 911 or go to the emergency department.  Pager Numbers  - Dr. Nehemiah Massed: [phone]  - Dr. Laurence Ferrari: [phone]  - Dr. Nicole Kindred: [phone]  In the event of inclement weather, please call our main line at [phone] for an update on the status of any delays or closures.  Dermatology Medication Tips: Please keep the boxes that topical medications come in in order to help keep track of the instructions about where and how to use these. Pharmacies typically print the medication instructions only on the boxes and not directly on the medication tubes.   If your medication is too expensive, please contact our office at [phone] option 4 or send [place] a message through [place].   We are unable to tell what your co-pay for medications will be in advance as  this is different depending on your insurance coverage. However, we may be able to find a substitute medication at lower cost or fill out paperwork to get insurance to cover a needed medication.   If a prior authorization is required to get your medication covered by your insurance company, please allow [place] 1-2 business days to complete this process.  Drug prices often vary depending on where the prescription is filled and some pharmacies may offer cheaper prices.  The website www.goodrx.com contains coupons for medications through different  pharmacies. The prices here do not account for what the cost may be with help from insurance (it may be cheaper with your insurance), but the website can give you the price if you did not use any insurance.  - You can print the associated coupon and take it with your prescription to the pharmacy.  - You may also stop by our office during regular business hours and pick up a GoodRx coupon card.  - If you need your prescription sent electronically to a different pharmacy, notify our office through [facility] or by phone at [phone] option 4.     Si Usted Necesita Algo Despus de Su Visita  Tambin puede enviarnos un mensaje a travs de Pharmacist, community. Por lo general respondemos a los mensajes de MyChart en el transcurso de 1 a 2 das hbiles.  Para renovar recetas, por favor pida a su farmacia que se ponga en contacto con nuestra oficina. Harland Dingwall de fax es [place] [phone].  Si tiene un asunto urgente cuando la clnica est cerrada y que no puede esperar hasta el siguiente da hbil, puede llamar/localizar a su doctor(a) al nmero que aparece a continuacin.   Por favor, tenga en cuenta que aunque hacemos todo lo posible para estar disponibles para asuntos urgentes fuera del horario de [place], no estamos disponibles las 24 horas del da, los 7 das de la [place].   Si tiene un problema urgente y no puede comunicarse con nosotros, puede optar por buscar atencin mdica  en el consultorio de su doctor(a), en una clnica privada, en un centro de atencin urgente o en una sala de emergencias.  Si tiene Engineering geologist, por favor llame inmediatamente al 911 o vaya a la sala de emergencias.  Nmeros de bper  - Dr. Nehemiah Massed: [phone]  - Dra. Moye: [phone]  - Dra. Nicole Kindred: [phone]  En caso de inclemencias del [place], por favor llame a Johnsie Kindred principal al [phone] para una actualizacin sobre el [place] de cualquier retraso o cierre.  Consejos para la  medicacin en dermatologa: Por favor, guarde las cajas en las que vienen los medicamentos de uso tpico para ayudarle a seguir las instrucciones sobre dnde y cmo usarlos. Las farmacias generalmente imprimen las instrucciones del medicamento slo en las cajas y no directamente en los tubos del [place].   Si su medicamento es muy caro, por favor, pngase en contacto con Zigmund Daniel llamando al [phone] y presione la opcin 4 o envenos un mensaje a travs de Pharmacist, community.   No podemos decirle cul ser su copago por los medicamentos por adelantado ya que esto es diferente dependiendo de la cobertura de su seguro. Sin embargo, es posible que podamos encontrar un medicamento sustituto a Electrical engineer un formulario para que el seguro cubra el medicamento que se considera necesario.   Si se requiere una autorizacin previa para que su compaa de seguros [place] su medicamento, por favor permtanos de 1 a 2 das hbiles  para Education officer, community.  Los precios de los medicamentos varan con frecuencia dependiendo del Environmental consultant de dnde se surte la receta y alguna farmacias pueden ofrecer precios ms baratos.  El sitio web www.goodrx.com tiene cupones para medicamentos de Airline pilot. Los precios aqu no tienen en cuenta lo que podra costar con la ayuda del seguro (puede ser ms barato con su seguro), pero el sitio web puede darle el precio si no utiliz Research scientist (physical sciences).  - Puede imprimir el cupn correspondiente y llevarlo con su receta a la farmacia.  - Tambin puede pasar por nuestra oficina durante el horario de atencin regular y Charity fundraiser una tarjeta de cupones de GoodRx.  - Si necesita que su receta se enve electrnicamente a una farmacia diferente, informe a nuestra oficina a travs de MyChart de [place] o por telfono llamando al [phone] y presione la opcin 4.

## 2021-05-24 NOTE — Progress Notes (Signed)
Follow-Up Visit   Subjective  Brianna Calhoun is a 65 y.o. female who presents for the following: Other (Spots of chest, back and face that are irritating).  The following portions of the chart were reviewed this encounter and updated as appropriate:   Tobacco  Allergies  Meds  Problems  Med Hx  Surg Hx  Fam Hx     Review of Systems:  No other skin or systemic complaints except as noted in HPI or Assessment and Plan.  Objective  Well appearing patient in no apparent distress; mood and affect are within normal limits.  A focused examination was performed including fdace, neck, chest and back. Relevant physical exam findings are noted in the Assessment and Plan.  Face x 4, chest/neck x 2, left chest x 1 (8) Erythematous thin papules/macules with gritty scale.   Right temple x 1, chest/neck x 2, back x 1 (4) Erythematous keratotic or waxy stuck-on papule or plaque.   Left chest 0.6 cm hyperkeratotic papule   Assessment & Plan   AK (actinic keratosis) (8) Face x 4, chest/neck x 2, left chest x 1 (7); Left nose x 1  Actinic Damage - Severe, confluent actinic changes with pre-cancerous actinic keratoses  - Severe, chronic, not at goal, secondary to cumulative UV radiation exposure over time - diffuse scaly erythematous macules and papules with underlying dyspigmentation - Discussed Prescription "Field Treatment" for Severe, Chronic Confluent Actinic Changes with Pre-Cancerous Actinic Keratoses Field treatment involves treatment of an entire area of skin that has confluent Actinic Changes (Sun/ Ultraviolet light damage) and PreCancerous Actinic Keratoses by method of PhotoDynamic Therapy (PDT) and/or prescription Topical Chemotherapy agents such as 5-fluorouracil, 5-fluorouracil/calcipotriene, and/or imiquimod.  The purpose is to decrease the number of clinically evident and subclinical PreCancerous lesions to prevent progression to development of skin cancer by chemically  destroying early precancer changes that may or may not be visible.  It has been shown to reduce the risk of developing skin cancer in the treated area. As a result of treatment, redness, scaling, crusting, and open sores may occur during treatment course. One or more than one of these methods may be used and may have to be used several times to control, suppress and eliminate the PreCancerous changes. Discussed treatment course, expected reaction, and possible side effects. - Recommend daily broad spectrum sunscreen SPF 30+ to sun-exposed areas, reapply every 2 hours as needed.  - Staying in the shade or wearing long sleeves, sun glasses (UVA+UVB protection) and wide brim hats (4-inch brim around the entire circumference of the hat) are also recommended. - Call for new or changing lesions. Start 5 Fluorouracil 5% cream mixed with Calcipotriene cream bid x 7 days to left nose  Destruction of lesion - Face x 4, chest/neck x 2, left chest x 1; nose x1 Complexity: simple   Destruction method: cryotherapy   Informed consent: discussed and consent obtained   Timeout:  patient name, date of birth, surgical site, and procedure verified Lesion destroyed using liquid nitrogen: Yes   Region frozen until ice ball extended beyond lesion: Yes   Outcome: patient tolerated procedure well with no complications   Post-procedure details: wound care instructions given    fluorouracil (EFUDEX) 5 % cream - Face x 4, chest/neck x 2, left chest x 1 Apply topically 2 (two) times daily. X 7 days  Inflamed seborrheic keratosis Right temple x 1, chest/neck x 2, back x 1  Destruction of lesion - Right temple x 1, chest/neck  x 2, back x 1 Complexity: simple   Destruction method: cryotherapy   Informed consent: discussed and consent obtained   Timeout:  patient name, date of birth, surgical site, and procedure verified Lesion destroyed using liquid nitrogen: Yes   Region frozen until ice ball extended beyond lesion:  Yes   Outcome: patient tolerated procedure well with no complications   Post-procedure details: wound care instructions given    Neoplasm of uncertain behavior of skin Left chest  Epidermal / dermal shaving  Lesion diameter (cm):  0.6 Informed consent: discussed and consent obtained   Timeout: patient name, date of birth, surgical site, and procedure verified   Procedure prep:  Patient was prepped and draped in usual sterile fashion Prep type:  Isopropyl alcohol Anesthesia: the lesion was anesthetized in a standard fashion   Anesthetic:  1% lidocaine w/ epinephrine 1-100,000 buffered w/ 8.4% NaHCO3 Instrument used: flexible razor blade   Hemostasis achieved with: pressure, aluminum chloride and electrodesiccation   Outcome: patient tolerated procedure well   Post-procedure details: sterile dressing applied and wound care instructions given   Dressing type: bandage and petrolatum    Destruction of lesion Complexity: extensive   Destruction method: electrodesiccation and curettage   Informed consent: discussed and consent obtained   Timeout:  patient name, date of birth, surgical site, and procedure verified Procedure prep:  Patient was prepped and draped in usual sterile fashion Prep type:  Isopropyl alcohol Anesthesia: the lesion was anesthetized in a standard fashion   Anesthetic:  1% lidocaine w/ epinephrine 1-100,000 buffered w/ 8.4% NaHCO3 Curettage performed in three different directions: Yes   Electrodesiccation performed over the curetted area: Yes   Lesion length (cm):  0.6 Lesion width (cm):  0.6 Margin per side (cm):  0.2 Final wound size (cm):  1 Hemostasis achieved with:  pressure and aluminum chloride Outcome: patient tolerated procedure well with no complications   Post-procedure details: sterile dressing applied and wound care instructions given   Dressing type: bandage and petrolatum    Specimen 1 - Surgical pathology Differential Diagnosis: SCC vs other   Check Margins: No EDC today  Return for Follow up as scheduled.  I, Ashok Cordia, CMA, am acting as scribe for Sarina Ser, MD . Documentation: I have reviewed the above documentation for accuracy and completeness, and I agree with the above.  Sarina Ser, MD

## 2021-05-25 ENCOUNTER — Telehealth: Payer: Self-pay

## 2021-05-25 NOTE — Telephone Encounter (Signed)
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

## 2021-05-25 NOTE — Telephone Encounter (Signed)
Advised patient of results/hd  

## 2021-05-28 ENCOUNTER — Encounter: Payer: Self-pay | Admitting: Dermatology

## 2021-06-07 ENCOUNTER — Encounter: Payer: Medicare Other | Admitting: Physical Therapy

## 2021-08-06 LAB — HM PAP SMEAR: HM Pap smear: NORMAL

## 2021-08-15 ENCOUNTER — Ambulatory Visit: Payer: Medicare Other | Admitting: Neurology

## 2021-08-16 ENCOUNTER — Ambulatory Visit (INDEPENDENT_AMBULATORY_CARE_PROVIDER_SITE_OTHER): Payer: Medicare Other | Admitting: Neurology

## 2021-08-16 ENCOUNTER — Encounter: Payer: Self-pay | Admitting: Neurology

## 2021-08-16 DIAGNOSIS — G43709 Chronic migraine without aura, not intractable, without status migrainosus: Secondary | ICD-10-CM

## 2021-08-16 NOTE — Progress Notes (Signed)
Botox- 200 units x 1 vial Lot: [id number] Expiration: 03/2024 NDC: 9675-9163-84   0.9% Sodium Chloride- 34mL total Lot: [id number] Expiration: 08/09/2022 NDC: 5701-7793-90  Dx: Z00.923  B/B

## 2021-08-16 NOTE — Progress Notes (Signed)
Consent Form Botulism Toxin Injection For Chronic Migraine   Interval history 08/16/2021: Still doing great, greater than 80% improvement in frequency of headaches and migraines.  Reviewed orally with patient, additionally signature is on file:  Botulism toxin has been approved by the Federal drug administration for treatment of chronic migraine. Botulism toxin does not cure chronic migraine and it may not be effective in some patients.  The administration of botulism toxin is accomplished by injecting a small amount of toxin into the muscles of the neck and head. Dosage must be titrated for each individual. Any benefits resulting from botulism toxin tend to wear off after 3 months with a repeat injection required if benefit is to be maintained. Injections are usually done every 3-4 months with maximum effect peak achieved by about 2 or 3 weeks. Botulism toxin is expensive and you should be sure of what costs you will incur resulting from the injection.  The side effects of botulism toxin use for chronic migraine may include:   -Transient, and usually mild, facial weakness with facial injections  -Transient, and usually mild, head or neck weakness with head/neck injections  -Reduction or loss of forehead facial animation due to forehead muscle weakness  -Eyelid drooping  -Dry eye  -Pain at the site of injection or bruising at the site of injection  -Double vision  -Potential unknown long term risks  Contraindications: You should not have Botox if you are pregnant, nursing, allergic to albumin, have an infection, skin condition, or muscle weakness at the site of the injection, or have myasthenia gravis, Lambert-Eaton syndrome, or ALS.  It is also possible that as with any injection, there may be an allergic reaction or no effect from the medication. Reduced effectiveness after repeated injections is sometimes seen and rarely infection at the injection site may occur. All care will be taken to  prevent these side effects. If therapy is given over a long time, atrophy and wasting in the muscle injected may occur. Occasionally the patient's become refractory to treatment because they develop antibodies to the toxin. In this event, therapy needs to be modified.  I have read the above information and consent to the administration of botulism toxin.    BOTOX PROCEDURE NOTE FOR MIGRAINE HEADACHE    Contraindications and precautions discussed with patient(above). Aseptic procedure was observed and patient tolerated procedure. Procedure performed by Dr. Georgia Dom  The condition has existed for more than 6 months, and pt does not have a diagnosis of ALS, Myasthenia Gravis or Lambert-Eaton Syndrome.  Risks and benefits of injections discussed and pt agrees to proceed with the procedure.  Written consent obtained  These injections are medically necessary. Pt  receives good benefits from these injections. These injections do not cause sedations or hallucinations which the oral therapies may cause.  Description of procedure:  The patient was placed in a sitting position. The standard protocol was used for Botox as follows, with 5 units of Botox injected at each site:   -Procerus muscle, midline injection  -Corrugator muscle, bilateral injection  -Frontalis muscle, bilateral injection, with 2 sites each side, medial injection was performed in the upper one third of the frontalis muscle, in the region vertical from the medial inferior edge of the superior orbital rim. The lateral injection was again in the upper one third of the forehead vertically above the lateral limbus of the cornea, 1.5 cm lateral to the medial injection site.  -Temporalis muscle injection, 4 sites, bilaterally. The first injection was  3 cm above the tragus of the ear, second injection site was 1.5 cm to 3 cm up from the first injection site in line with the tragus of the ear. The third injection site was 1.5-3 cm forward  between the first 2 injection sites. The fourth injection site was 1.5 cm posterior to the second injection site.   -Occipitalis muscle injection, 3 sites, bilaterally. The first injection was done one half way between the occipital protuberance and the tip of the mastoid process behind the ear. The second injection site was done lateral and superior to the first, 1 fingerbreadth from the first injection. The third injection site was 1 fingerbreadth superiorly and medially from the first injection site.  -Cervical paraspinal muscle injection, 2 sites, bilateral knee first injection site was 1 cm from the midline of the cervical spine, 3 cm inferior to the lower border of the occipital protuberance. The second injection site was 1.5 cm superiorly and laterally to the first injection site.  -Trapezius muscle injection was performed at 3 sites, bilaterally. The first injection site was in the upper trapezius muscle halfway between the inflection point of the neck, and the acromion. The second injection site was one half way between the acromion and the first injection site. The third injection was done between the first injection site and the inflection point of the neck.   Will return for repeat injection in 3 months.   155 units of Botox was used, 45U Botox not injected was wasted. The patient tolerated the procedure well, there were no complications of the above procedure.

## 2021-09-11 ENCOUNTER — Other Ambulatory Visit: Payer: Self-pay | Admitting: Internal Medicine

## 2021-09-11 DIAGNOSIS — Z1231 Encounter for screening mammogram for malignant neoplasm of breast: Secondary | ICD-10-CM

## 2021-09-20 ENCOUNTER — Other Ambulatory Visit: Payer: Self-pay

## 2021-09-20 ENCOUNTER — Ambulatory Visit (INDEPENDENT_AMBULATORY_CARE_PROVIDER_SITE_OTHER): Payer: Medicare Other | Admitting: Dermatology

## 2021-09-20 DIAGNOSIS — D1800 Hemangioma unspecified site: Secondary | ICD-10-CM

## 2021-09-20 DIAGNOSIS — L570 Actinic keratosis: Secondary | ICD-10-CM | POA: Diagnosis not present

## 2021-09-20 DIAGNOSIS — L2089 Other atopic dermatitis: Secondary | ICD-10-CM

## 2021-09-20 DIAGNOSIS — Z85828 Personal history of other malignant neoplasm of skin: Secondary | ICD-10-CM

## 2021-09-20 DIAGNOSIS — L578 Other skin changes due to chronic exposure to nonionizing radiation: Secondary | ICD-10-CM

## 2021-09-20 DIAGNOSIS — Z1283 Encounter for screening for malignant neoplasm of skin: Secondary | ICD-10-CM

## 2021-09-20 DIAGNOSIS — L820 Inflamed seborrheic keratosis: Secondary | ICD-10-CM | POA: Diagnosis not present

## 2021-09-20 DIAGNOSIS — L111 Transient acantholytic dermatosis [Grover]: Secondary | ICD-10-CM

## 2021-09-20 DIAGNOSIS — L814 Other melanin hyperpigmentation: Secondary | ICD-10-CM

## 2021-09-20 DIAGNOSIS — L821 Other seborrheic keratosis: Secondary | ICD-10-CM

## 2021-09-20 DIAGNOSIS — D229 Melanocytic nevi, unspecified: Secondary | ICD-10-CM

## 2021-09-20 MED ORDER — TRIAMCINOLONE ACETONIDE 0.1 % EX CREA: TOPICAL_CREAM | CUTANEOUS | 0 refills | Status: DC

## 2021-09-20 NOTE — Patient Instructions (Signed)

## 2021-09-20 NOTE — Progress Notes (Signed)
? ?Follow-Up Visit ?  ?Subjective  ?Brianna Calhoun is a 66 y.o. female who presents for the following: Actinic Keratosis (Of the face, neck, and chest - patient is here to check for any new or persistent skin lesions.), Hx of SCC (L chest S/P ED&C  - patient is here today to check for any persistence or recurrence.), and Annual Exam (Patient has noticed a scaly patch on the nose that she would like checked today. ). The patient presents for Total-Body Skin Exam (TBSE) for skin cancer screening and mole check.  The patient has spots, moles and lesions to be evaluated, some may be new or changing. ? ?The following portions of the chart were reviewed this encounter and updated as appropriate:  ? Tobacco  Allergies  Meds  Problems  Med Hx  Surg Hx  Fam Hx   ?  ?Review of Systems:  No other skin or systemic complaints except as noted in HPI or Assessment and Plan. ? ?Objective  ?Well appearing patient in no apparent distress; mood and affect are within normal limits. ? ?A focused examination was performed including the face, neck, and chest. Relevant physical exam findings are noted in the Assessment and Plan. ? ?R cheek x 1, L nose x 1, L cheek x 1, L deltoid x 1 (4) ?Erythematous thin papules/macules with gritty scale.  ? ?Back x 16 (16) ?Erythematous stuck-on, waxy papule or plaque ? ? ?Assessment & Plan  ?AK (actinic keratosis) (4) ?R cheek x 1, L nose x 1, L cheek x 1, L deltoid x 1 ?Destruction of lesion - R cheek x 1, L nose x 1, L cheek x 1, L deltoid x 1 ?Complexity: simple   ?Destruction method: cryotherapy   ?Informed consent: discussed and consent obtained   ?Timeout:  patient name, date of birth, surgical site, and procedure verified ?Lesion destroyed using liquid nitrogen: Yes   ?Region frozen until ice ball extended beyond lesion: Yes   ?Outcome: patient tolerated procedure well with no complications   ?Post-procedure details: wound care instructions given   ? ?Related Medications ?fluorouracil  (EFUDEX) 5 % cream ?Apply topically 2 (two) times daily. X 7 days ? ?Inflamed seborrheic keratosis (16) ?Back x 16 ?Destruction of lesion - Back x 16 ?Complexity: simple   ?Destruction method: cryotherapy   ?Informed consent: discussed and consent obtained   ?Timeout:  patient name, date of birth, surgical site, and procedure verified ?Lesion destroyed using liquid nitrogen: Yes   ?Region frozen until ice ball extended beyond lesion: Yes   ?Outcome: patient tolerated procedure well with no complications   ?Post-procedure details: wound care instructions given   ? 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  It more commonly presents in the winter and can be associated with atopic dermatitis, renal failure, transplantation and malignancies. ?It is very difficult to treat and can be persistent and recurrent but topical steroids, calcipotriene cream, antihistamines can be helpful.  For recalcitrant disease, other treatments have been used such as isotretinoin, acitretin, systemic steroids, and Dupixent. ? ?Start TMC 0.1% cream to aa's rash QD PRN. Topical steroids (such as triamcinolone, fluocinolone, fluocinonide, mometasone, clobetasol, halobetasol, betamethasone, hydrocortisone) can cause thinning and lightening of the skin if they are used for too  long in the same area. Your physician has selected the right strength medicine for your problem and area affected on the body. Please use your medication only as directed by your physician to prevent side effects.  ? ?triamcinolone cream (KENALOG) 0.1 % - Trunk ?Apply to aa's rash QD-BID PRN. Avoid applying to  face, groin, and axilla. Use as directed. Long-term use can cause thinning of the skin. ? ?Atopic dermatitis /hand dermatitis ?B/L hand ?Atopic dermatitis (eczema) is a chronic, relapsing, pruritic condition that can significantly affect quality of life. It is often associated with allergic rhinitis and/or asthma and can require treatment with topical medications, phototherapy, or in severe cases biologic injectable medication (Dupixent; Adbry) or Oral JAK inhibitors. ? ?Continue Mometasone 0.1% cream to aa's QD PRN.  ?Topical steroids (such as triamcinolone, fluocinolone, fluocinonide, mometasone, clobetasol, halobetasol, betamethasone, hydrocortisone) can cause thinning and lightening of the skin if they are used for too long in the same area. Your physician has selected the right strength medicine for your problem and area affected on the body. Please use your medication only as directed by your physician to prevent side effects.  ? ?Actinic Damage - Severe, confluent actinic changes with pre-cancerous actinic keratoses  ?- Severe, chronic, not at goal, secondary to cumulative UV radiation exposure over time ?- diffuse scaly erythematous macules and papules with underlying dyspigmentation ?- Discussed Prescription "Field Treatment" for Severe, Chronic Confluent Actinic Changes with Pre-Cancerous Actinic Keratoses ?Field treatment involves treatment of an entire area of skin that has confluent Actinic Changes (Sun/ Ultraviolet light damage) and PreCancerous Actinic Keratoses by method of PhotoDynamic Therapy (PDT) and/or prescription Topical Chemotherapy agents such as 5-fluorouracil, 5-fluorouracil/calcipotriene, and/or imiquimod.  The purpose is to decrease the number of clinically evident and subclinical PreCancerous lesions to prevent progression to development of skin cancer by chemically destroying early precancer changes that may or may not be visible.  It has been shown to reduce the risk of developing  skin cancer in the treated area. As a result of treatment, redness, scaling, crusting, and open sores may occur during treatment course. One or more than one of these methods may be used and may have to be used several times to control, suppress and eliminate the PreCancerous changes. Discussed treatment course, expected reaction, and possible side effects. ?- Recommend daily broad spectrum sunscreen SPF 30+ to sun-exposed areas, reapply every 2 hours as needed.  ?- Staying in the shade or wearing long sleeves, sun glasses (UVA+UVB protection) and wide brim hats (4-inch brim around the entire circumference of the hat) are also recommended. ?- Call for new or changing lesions. ? ?History of Squamous Cell Carcinoma of the Skin ?- No evidence of recurrence today ?- No lymphadenopathy ?- Recommend regular full body skin exams ?- Recommend daily broad spectrum sunscreen SPF 30+ to sun-exposed areas, reapply every 2 hours as needed.  ?- Call if any new or changing lesions are noted between office visits ? ?Lentigines ?- Scattered tan macules ?- Due to sun exposure ?- Benign-appearing, observe ?- Recommend daily broad spectrum sunscreen SPF 30+ to sun-exposed areas, reapply every 2 hours as needed. ?- Call for any changes ? ?Seborrheic Keratoses ?- Stuck-on, waxy, tan-brown papules and/or plaques  ?- Benign-appearing ?- Discussed benign etiology and prognosis. ?- Observe ?- Call for any changes ? ?Melanocytic Nevi ?- Tan-brown and/or pink-flesh-colored symmetric macules and papules ?- Benign appearing on exam today ?- Observation ?- Call clinic for new or changing moles ?- Recommend daily use of broad spectrum  spf 30+ sunscreen to sun-exposed areas.  ? ?Hemangiomas ?- Red papules ?- Discussed benign nature ?- Observe ?- Call for any changes ? ?History of Basal Cell Carcinoma of the Skin ?- No evidence of recurrence today ?- Recommend regular full body skin exams ?- Recommend daily broad spectrum sunscreen SPF 30+ to  sun-exposed areas, reapply every 2 hours as needed.  ?- Call if any new or changing lesions are noted between office visits ? ?Skin cancer screening performed today. ? ?Return in about 6 months (around 03/23/2022) for TBS

## 2021-09-26 ENCOUNTER — Encounter: Payer: Self-pay | Admitting: Dermatology

## 2021-10-04 ENCOUNTER — Other Ambulatory Visit: Payer: Self-pay

## 2021-10-04 ENCOUNTER — Encounter: Payer: Self-pay | Admitting: Internal Medicine

## 2021-10-04 ENCOUNTER — Ambulatory Visit (INDEPENDENT_AMBULATORY_CARE_PROVIDER_SITE_OTHER): Payer: Medicare Other | Admitting: Internal Medicine

## 2021-10-04 ENCOUNTER — Telehealth: Payer: Self-pay | Admitting: Internal Medicine

## 2021-10-04 VITALS — BP 132/72 | HR 71 | Temp 97.9°F | Ht 65.0 in | Wt 140.2 lb

## 2021-10-04 DIAGNOSIS — R7303 Prediabetes: Secondary | ICD-10-CM

## 2021-10-04 DIAGNOSIS — R7989 Other specified abnormal findings of blood chemistry: Secondary | ICD-10-CM

## 2021-10-04 DIAGNOSIS — E785 Hyperlipidemia, unspecified: Secondary | ICD-10-CM | POA: Diagnosis not present

## 2021-10-04 DIAGNOSIS — R300 Dysuria: Secondary | ICD-10-CM

## 2021-10-04 DIAGNOSIS — R350 Frequency of micturition: Secondary | ICD-10-CM | POA: Diagnosis not present

## 2021-10-04 DIAGNOSIS — F411 Generalized anxiety disorder: Secondary | ICD-10-CM

## 2021-10-04 DIAGNOSIS — M25542 Pain in joints of left hand: Secondary | ICD-10-CM | POA: Diagnosis not present

## 2021-10-04 DIAGNOSIS — M8580 Other specified disorders of bone density and structure, unspecified site: Secondary | ICD-10-CM | POA: Insufficient documentation

## 2021-10-04 DIAGNOSIS — R030 Elevated blood-pressure reading, without diagnosis of hypertension: Secondary | ICD-10-CM

## 2021-10-04 DIAGNOSIS — Z0000 Encounter for general adult medical examination without abnormal findings: Secondary | ICD-10-CM | POA: Diagnosis not present

## 2021-10-04 DIAGNOSIS — L989 Disorder of the skin and subcutaneous tissue, unspecified: Secondary | ICD-10-CM

## 2021-10-04 DIAGNOSIS — Z780 Asymptomatic menopausal state: Secondary | ICD-10-CM

## 2021-10-04 DIAGNOSIS — M25541 Pain in joints of right hand: Secondary | ICD-10-CM | POA: Diagnosis not present

## 2021-10-04 LAB — CBC WITH DIFFERENTIAL/PLATELET
Basophils Absolute: 0.1 10*3/uL (ref 0.0–0.1)
Basophils Relative: 1.1 % (ref 0.0–3.0)
Eosinophils Absolute: 0.1 10*3/uL (ref 0.0–0.7)
Eosinophils Relative: 1.8 % (ref 0.0–5.0)
HCT: 43.4 % (ref 36.0–46.0)
Hemoglobin: 14.7 g/dL (ref 12.0–15.0)
Lymphocytes Relative: 39.4 % (ref 12.0–46.0)
Lymphs Abs: 2 10*3/uL (ref 0.7–4.0)
MCHC: 33.9 g/dL (ref 30.0–36.0)
MCV: 91.5 fl (ref 78.0–100.0)
Monocytes Absolute: 0.5 10*3/uL (ref 0.1–1.0)
Monocytes Relative: 9.7 % (ref 3.0–12.0)
Neutro Abs: 2.4 10*3/uL (ref 1.4–7.7)
Neutrophils Relative %: 48 % (ref 43.0–77.0)
Platelets: 254 10*3/uL (ref 150.0–400.0)
RBC: 4.74 Mil/uL (ref 3.87–5.11)
RDW: 12.7 % (ref 11.5–15.5)
WBC: 5 10*3/uL (ref 4.0–10.5)

## 2021-10-04 LAB — COMPREHENSIVE METABOLIC PANEL
ALT: 20 U/L (ref 0–35)
AST: 22 U/L (ref 0–37)
Albumin: 4.5 g/dL (ref 3.5–5.2)
Alkaline Phosphatase: 71 U/L (ref 39–117)
BUN: 17 mg/dL (ref 6–23)
CO2: 26 mEq/L (ref 19–32)
Calcium: 9.5 mg/dL (ref 8.4–10.5)
Chloride: 105 mEq/L (ref 96–112)
Creatinine, Ser: 0.8 mg/dL (ref 0.40–1.20)
GFR: 76 mL/min (ref >60.00)
Glucose, Bld: 96 mg/dL (ref 70–99)
Potassium: 4 mEq/L (ref 3.5–5.1)
Sodium: 140 mEq/L (ref 135–145)
Total Bilirubin: 0.7 mg/dL (ref 0.2–1.2)
Total Protein: 6.7 g/dL (ref 6.0–8.3)

## 2021-10-04 LAB — LIPID PANEL
Cholesterol: 232 mg/dL — ABNORMAL HIGH (ref 0–200)
HDL: 46.9 mg/dL (ref >39.00)
LDL Cholesterol: 164 mg/dL — ABNORMAL HIGH (ref 0–99)
NonHDL: 185.06
Total CHOL/HDL Ratio: 5
Triglycerides: 107 mg/dL (ref 0.0–149.0)
VLDL: 21.4 mg/dL (ref 0.0–40.0)

## 2021-10-04 LAB — URINALYSIS, ROUTINE W REFLEX MICROSCOPIC
Bilirubin Urine: NEGATIVE
Hgb urine dipstick: NEGATIVE
Ketones, ur: NEGATIVE
Leukocytes,Ua: NEGATIVE
Nitrite: NEGATIVE
RBC / HPF: NONE SEEN (ref 0–?)
Specific Gravity, Urine: >=1.03 — AB (ref 1.000–1.030)
Total Protein, Urine: NEGATIVE
Urine Glucose: NEGATIVE
Urobilinogen, UA: 0.2 (ref 0.0–1.0)
pH: 5.5 (ref 5.0–8.0)

## 2021-10-04 LAB — TSH: TSH: 2.98 u[IU]/mL (ref 0.35–5.50)

## 2021-10-04 LAB — HEMOGLOBIN A1C: Hgb A1c MFr Bld: 5.6 % (ref 4.6–6.5)

## 2021-10-04 MED ORDER — ALPRAZOLAM 0.25 MG PO TABS: 0.2500 mg | ORAL_TABLET | Freq: Every evening | ORAL | 0 refills | Status: DC | PRN

## 2021-10-04 MED ORDER — ALPRAZOLAM 0.25 MG PO TABS: 0.1250 mg | ORAL_TABLET | Freq: Every evening | ORAL | 0 refills | Status: DC | PRN

## 2021-10-04 MED ORDER — CELECOXIB 200 MG PO CAPS: ORAL_CAPSULE | ORAL | 2 refills | Status: DC

## 2021-10-04 NOTE — Patient Instructions (Signed)
You can use 1/2 to 1 alprazolam if needed for 3 am wake ups ? ? ?You can use celebrex ,  up to 2 daily AS NEEDED for knee pain  ? ? ?Benefiber with prebiotics  and IBGARD for IBS symptoms (use both in a daily regimen as a trial ) ? ? ?DEXA ordered   ? ? ?You'll need PVAX 23 in 5 years  ? ? ? ?

## 2021-10-04 NOTE — Assessment & Plan Note (Signed)
Will repeat DEXA this year at a 5 yr interval  ?

## 2021-10-04 NOTE — Assessment & Plan Note (Signed)
Checking UA and culture today

## 2021-10-04 NOTE — Assessment & Plan Note (Signed)
Improved with use of Estrace prescribed by GYN  ?

## 2021-10-04 NOTE — Assessment & Plan Note (Signed)
MANIFESTING AS EARLYmorning  WAKING OCCURRING infrequently.  Prn alprazolam The risks and benefits of benzodiazepine use were discussed with patient today including excessive sedation leading to respiratory depression,  impaired thinking/driving, and addiction.  Patient was advised to avoid concurrent use with alcohol, to use medication only as needed and not to share with others  .  ?

## 2021-10-04 NOTE — Progress Notes (Signed)
Patient ID: Brianna Calhoun, female    DOB: [birth year]  Age: 66 y.o. MRN: [id number] ? ?The patient is here for follow up and  management of other chronic and acute problems. ?  ?The risk factors are reflected in the social history. ? ?The roster of all physicians providing medical care to patient - is listed in the Snapshot section of the chart. ? ?Activities of daily living:  The patient is 100% independent in all ADLs: dressing, toileting, feeding as well as independent mobility ? ?Home safety : The patient has smoke detectors in the home. They wear seatbelts.  There are no firearms at home. There is no violence in the home.  ? ?There is no risks for hepatitis, STDs or HIV. There is no   history of blood transfusion. They have no travel history to infectious disease endemic areas of the world. ? ?The patient has seen their dentist in the last six month. They have seen their eye doctor in the last year. They admit to slight hearing difficulty with regard to whispered voices and some television programs.  They have deferred audiologic testing in the last year.  They do not  have excessive sun exposure. Discussed the need for sun protection: hats, long sleeves and use of sunscreen if there is significant sun exposure.  ? ?Diet: the importance of a healthy diet is discussed. They do have a healthy diet. ? ?The benefits of regular aerobic exercise were discussed. She walks 4 times per week ,  20 minutes.  ? ?Depression screen: there are no signs or vegative symptoms of depression- irritability, change in appetite, anhedonia, sadness/tearfullness. ? ?Cognitive assessment: the patient manages all their financial and personal affairs and is actively engaged. They could relate day,date,year and events; recalled 2/3 objects at 3 minutes; performed clock-face test normally. ? ?The following portions of the patient's history were reviewed and updated as appropriate: allergies, current medications, past family history, past  medical history,  past surgical history, past social history  and problem list. ? ?Visual acuity was not assessed per patient preference since she has regular follow up with her ophthalmologist. Hearing and body mass index were assessed and reviewed.  ? ?During the course of the visit the patient was educated and counseled about appropriate screening and preventive services including : fall prevention , diabetes screening, nutrition counseling, colorectal cancer screening, and recommended immunizations.   ? ?CC: The primary encounter diagnosis was Encounter for preventive health examination. Diagnoses of Hyperlipidemia LDL goal <130, Dysuria, Abnormal thyroid blood test, Prediabetes, Arthralgia of both hands, Frequency of urination, Osteopenia after menopause, Postmenopausal estrogen deficiency, Urinary frequency, Perineal irritation in female, Elevated blood pressure reading in office with white coat syndrome, without diagnosis of hypertension, and Anxiety state were also pertinent to this visit. ? ?1) seeing Brianna Calhoun GYN , PAP smear normal.  Had an episode of  vaginitis treated with  fluconazole by urgent care. .  Placed on Estrace twice weekly with great results.    Seeing Brianna Calhoun for pelvic floor PT    ? ?2) right knee pain attibuted to aggressive exercise plays pickle ball 5 days per week. Uses prn melooxicam ? ?3) IBS symptoms,  alternating stool history ? ?4)  has early mornigng  waking 3 days in a row every few weeks.  Stressors identified.  ? ? ?History ?Brianna Calhoun has a past medical history of Actinic keratosis, Basal cell carcinoma (04/13/2008), Basal cell carcinoma (10/07/2019), Basal cell carcinoma (BCC) of face (03/08/2009), Basal cell carcinoma (  BCC) of face (11/28/2015), Basal cell carcinoma of trunk (03/29/2014), Cancer ([place]), GERD (gastroesophageal reflux disease), Hyperlipidemia, Migraine, and Squamous cell carcinoma of skin (05/24/2021).  ? ?She has a past surgical history that includes Colonoscopy  (N/A, 12/13/2014); Cholecystectomy (1994); Cesarean section (1984, 1986, [facility]); Eye surgery (1998); Hernia repair (1996); Left oophorectomy (Left, 2005); Cervical cerclage; llq incisional hernia repair & mini-tummy tuck (07/1994); and nasal surgeries.  ? ?Her family history includes Alcohol abuse in her mother; Arthritis in her mother; Breast cancer (age of onset: 38) in her maternal aunt; COPD in her mother; Colon cancer in her father; Depression in her mother; Diabetes in her brother, father, and mother; Hearing loss in her mother; Heart disease in her mother; Hyperlipidemia in her brother, father, and mother; Hypertension in her brother, father, and mother; Kidney disease in her mother; Liver disease in her paternal grandfather; Mental illness in her sister; Migraines in her daughter and maternal grandmother; Skin cancer in her mother; Stroke in her maternal grandfather; Vision loss in her mother.She reports that she quit smoking about 43 years ago. Her smoking use included cigarettes. She has never used smokeless tobacco. She reports current alcohol use of about 2.0 - 4.0 standard drinks per week. She reports that she does not use drugs. ? ?Outpatient Medications Prior to Visit  ?Medication Sig Dispense Refill  ? acetaminophen (TYLENOL) 325 MG tablet Take 650 mg by mouth every 6 (six) hours as needed.    ? estradiol (ESTRACE) 0.1 MG/GM vaginal cream Place vaginally.    ? methocarbamol (ROBAXIN) 500 MG tablet Take 1 tablet (500 mg total) by mouth every 6 (six) hours as needed for muscle spasms. 90 tablet 3  ? mometasone (ELOCON) 0.1 % cream Apply 1 application topically daily as needed (Rash). 45 g 2  ? Olopatadine HCl (PATADAY OP) Apply to eye.    ? OVER THE COUNTER MEDICATION Vitamin B, C, D3 5,000 in/K74mg 2-3 times per week    ? rizatriptan (MAXALT-MLT) 10 MG disintegrating tablet TAKE ONE TABLET AS NEEDED FOR MIGRAINE MAY REPEAT IN 2 HOURS IF NEEDED 10 tablet 5  ? triamcinolone cream (KENALOG) 0.1 %  Apply to aa's rash QD-BID PRN. Avoid applying to face, groin, and axilla. Use as directed. Long-term use can cause thinning of the skin. 45 g 0  ? XIIDRA 5 % SOLN Apply 1 drop to eye 2 (two) times daily.    ? Ibuprofen (ADVIL PO) Take by mouth as needed.    ? meloxicam (MOBIC) 15 MG tablet Take 15 mg by mouth daily.    ? Naproxen Sodium (ALEVE PO) Take by mouth as needed.    ? traMADol (ULTRAM) 50 MG tablet Take 1 tablet (50 mg total) by mouth every 6 (six) hours as needed. 30 tablet 0  ? Probiotic Product (PROBIOTIC PO) Take 30-50,000,000 Units by mouth at bedtime. (Patient not taking: Reported on 10/03/2021)    ? fluorouracil (EFUDEX) 5 % cream Apply topically 2 (two) times daily. X 7 days 15 g 1  ? UNABLE TO FIND Med Name: Allergy Drops    ? ?No facility-administered medications prior to visit.  ? ? ?Review of Systems ? ?Patient denies headache, fevers, malaise, unintentional weight loss, skin rash, eye pain, sinus congestion and sinus pain, sore throat, dysphagia,  hemoptysis , cough, dyspnea, wheezing, chest pain, palpitations, orthopnea, edema, abdominal pain, nausea, melena, diarrhea, constipation, flank pain, dysuria, hematuria,  nocturia, numbness, tingling, seizures,  Focal weakness, Loss of consciousness,  Tremor,  nightly insomnia,  depression, anxiety, and suicidal ideation.   ? ?Objective:  ?BP 132/72 (BP Location: Left Arm, Patient Position: Sitting, Cuff Size: Small)   Pulse 71   Temp 97.9 ?F (36.6 ?C) (Oral)   Ht '5\' 5"'$  (1.651 m)   Wt 140 lb 3.2 oz (63.6 kg)   SpO2 98%   BMI 23.33 kg/m?  ? ?Physical Exam ? ?General appearance: alert, cooperative and appears stated age ?Head: Normocephalic, without obvious abnormality, atraumatic ?Eyes: conjunctivae/corneas clear. PERRL, EOM's intact. Fundi benign. ?Ears: normal TM's and external ear canals both ears ?Nose: Nares normal. Septum midline. Mucosa normal. No drainage or sinus tenderness. ?Throat: lips, mucosa, and tongue normal; teeth and gums  normal ?Neck: no adenopathy, no carotid bruit, no JVD, supple, symmetrical, trachea midline and thyroid not enlarged, symmetric, no tenderness/mass/nodules ?Lungs: clear to auscultation bilaterally ?Breasts: normal appearanc

## 2021-10-04 NOTE — Telephone Encounter (Signed)
Pt called wanting to make sure that the appointment she had today was coded correctly due to her having medicare ?

## 2021-10-04 NOTE — Assessment & Plan Note (Signed)
She has no history of hypertension but has had several elevated readings of iffice ONLY  HOME READINGS HAVE BEEN < 130/80 . ? ?Lab Results  ?Component Value Date  ? CREATININE 0.75 10/19/2020  ? ?Lab Results  ?Component Value Date  ? NA 139 10/19/2020  ? K 4.3 10/19/2020  ? CL 104 10/19/2020  ? CO2 29 10/19/2020  ? ? ?

## 2021-10-04 NOTE — Telephone Encounter (Signed)
Lm for pt to cb.

## 2021-10-06 LAB — URINE CULTURE
MICRO NUMBER:: 13195982
Result:: NO GROWTH
SPECIMEN QUALITY:: ADEQUATE

## 2021-10-19 ENCOUNTER — Ambulatory Visit: Payer: Medicare Other | Admitting: Internal Medicine

## 2021-10-25 ENCOUNTER — Ambulatory Visit
Admission: RE | Admit: 2021-10-25 | Discharge: 2021-10-25 | Disposition: A | Discharge status: Home / Self Care | Payer: Medicare Other | Source: Ambulatory Visit | Attending: Internal Medicine | Admitting: Internal Medicine

## 2021-10-25 DIAGNOSIS — Z1231 Encounter for screening mammogram for malignant neoplasm of breast: Secondary | ICD-10-CM | POA: Insufficient documentation

## 2021-11-04 ENCOUNTER — Encounter: Payer: Self-pay | Admitting: Neurology

## 2021-11-09 ENCOUNTER — Ambulatory Visit: Payer: Medicare Other | Admitting: Neurology

## 2021-11-21 ENCOUNTER — Ambulatory Visit: Payer: Medicare Other | Admitting: Neurology

## 2021-11-22 ENCOUNTER — Ambulatory Visit (INDEPENDENT_AMBULATORY_CARE_PROVIDER_SITE_OTHER): Payer: Medicare Other | Admitting: Neurology

## 2021-11-22 DIAGNOSIS — G43709 Chronic migraine without aura, not intractable, without status migrainosus: Secondary | ICD-10-CM

## 2021-11-22 MED ORDER — KETOROLAC TROMETHAMINE 60 MG/2ML IM SOLN
60.0000 mg | Freq: Once | INTRAMUSCULAR | Status: AC
  Administered 2021-11-22: 60 mg via INTRAMUSCULAR

## 2021-11-22 NOTE — Progress Notes (Signed)
Consent Form Botulism Toxin Injection For Chronic Migraine   Interval history 11/22/2021: Still doing great, greater than 80% improvement in frequency of headaches and migraines.  Reviewed orally with patient, additionally signature is on file:  Botulism toxin has been approved by the Federal drug administration for treatment of chronic migraine. Botulism toxin does not cure chronic migraine and it may not be effective in some patients.  The administration of botulism toxin is accomplished by injecting a small amount of toxin into the muscles of the neck and head. Dosage must be titrated for each individual. Any benefits resulting from botulism toxin tend to wear off after 3 months with a repeat injection required if benefit is to be maintained. Injections are usually done every 3-4 months with maximum effect peak achieved by about 2 or 3 weeks. Botulism toxin is expensive and you should be sure of what costs you will incur resulting from the injection.  The side effects of botulism toxin use for chronic migraine may include:   -Transient, and usually mild, facial weakness with facial injections  -Transient, and usually mild, head or neck weakness with head/neck injections  -Reduction or loss of forehead facial animation due to forehead muscle weakness  -Eyelid drooping  -Dry eye  -Pain at the site of injection or bruising at the site of injection  -Double vision  -Potential unknown long term risks  Contraindications: You should not have Botox if you are pregnant, nursing, allergic to albumin, have an infection, skin condition, or muscle weakness at the site of the injection, or have myasthenia gravis, Lambert-Eaton syndrome, or ALS.  It is also possible that as with any injection, there may be an allergic reaction or no effect from the medication. Reduced effectiveness after repeated injections is sometimes seen and rarely infection at the injection site may occur. All care will be taken to  prevent these side effects. If therapy is given over a long time, atrophy and wasting in the muscle injected may occur. Occasionally the patient's become refractory to treatment because they develop antibodies to the toxin. In this event, therapy needs to be modified.  I have read the above information and consent to the administration of botulism toxin.    BOTOX PROCEDURE NOTE FOR MIGRAINE HEADACHE    Contraindications and precautions discussed with patient(above). Aseptic procedure was observed and patient tolerated procedure. Procedure performed by Dr. Georgia Dom  The condition has existed for more than 6 months, and pt does not have a diagnosis of ALS, Myasthenia Gravis or Lambert-Eaton Syndrome.  Risks and benefits of injections discussed and pt agrees to proceed with the procedure.  Written consent obtained  These injections are medically necessary. Pt  receives good benefits from these injections. These injections do not cause sedations or hallucinations which the oral therapies may cause.  Description of procedure:  The patient was placed in a sitting position. The standard protocol was used for Botox as follows, with 5 units of Botox injected at each site:   -Procerus muscle, midline injection  -Corrugator muscle, bilateral injection  -Frontalis muscle, bilateral injection, with 2 sites each side, medial injection was performed in the upper one third of the frontalis muscle, in the region vertical from the medial inferior edge of the superior orbital rim. The lateral injection was again in the upper one third of the forehead vertically above the lateral limbus of the cornea, 1.5 cm lateral to the medial injection site.  -Temporalis muscle injection, 4 sites, bilaterally. The first injection was  3 cm above the tragus of the ear, second injection site was 1.5 cm to 3 cm up from the first injection site in line with the tragus of the ear. The third injection site was 1.5-3 cm forward  between the first 2 injection sites. The fourth injection site was 1.5 cm posterior to the second injection site.   -Occipitalis muscle injection, 3 sites, bilaterally. The first injection was done one half way between the occipital protuberance and the tip of the mastoid process behind the ear. The second injection site was done lateral and superior to the first, 1 fingerbreadth from the first injection. The third injection site was 1 fingerbreadth superiorly and medially from the first injection site.  -Cervical paraspinal muscle injection, 2 sites, bilateral knee first injection site was 1 cm from the midline of the cervical spine, 3 cm inferior to the lower border of the occipital protuberance. The second injection site was 1.5 cm superiorly and laterally to the first injection site.  -Trapezius muscle injection was performed at 3 sites, bilaterally. The first injection site was in the upper trapezius muscle halfway between the inflection point of the neck, and the acromion. The second injection site was one half way between the acromion and the first injection site. The third injection was done between the first injection site and the inflection point of the neck.   Will return for repeat injection in 3 months.   155 units of Botox was used, 45U Botox not injected was wasted. The patient tolerated the procedure well, there were no complications of the above procedure.

## 2021-11-22 NOTE — Progress Notes (Signed)
Order for Toradol '60mg'$  IM per Dr. Jaynee Eagles. Injected '60mg'$ /92m to R upper outer quadrant. Tolerated well.  Bandaid applied. To check out.

## 2021-11-22 NOTE — Progress Notes (Signed)
Botox consent signed  Botox- 200 units x 1 vial Lot: [id number] Expiration: 06/2024 NDC: 4496-7591-63  Bacteriostatic 0.9% Sodium Chloride- 6m total Lot: [id number]: 02/07/2023 NDC: [address]-93 Dx: [id number]

## 2021-12-20 ENCOUNTER — Ambulatory Visit
Admission: RE | Admit: 2021-12-20 | Discharge: 2021-12-20 | Disposition: A | Discharge status: Home / Self Care | Payer: Medicare Other | Source: Ambulatory Visit | Attending: Internal Medicine | Admitting: Internal Medicine

## 2021-12-20 DIAGNOSIS — Z780 Asymptomatic menopausal state: Secondary | ICD-10-CM | POA: Diagnosis present

## 2021-12-20 DIAGNOSIS — M8580 Other specified disorders of bone density and structure, unspecified site: Secondary | ICD-10-CM | POA: Diagnosis present

## 2022-01-03 ENCOUNTER — Telehealth: Payer: Self-pay | Admitting: Internal Medicine

## 2022-01-03 NOTE — Telephone Encounter (Signed)
Spoke with patient she req a CB middle of 01/2022

## 2022-01-11 ENCOUNTER — Encounter: Payer: Self-pay | Admitting: Internal Medicine

## 2022-01-14 MED ORDER — AZITHROMYCIN 250 MG PO TABS
ORAL_TABLET | ORAL | 0 refills | Status: AC
Start: 2022-01-14 — End: 2022-01-19

## 2022-01-14 MED ORDER — ONDANSETRON HCL 4 MG PO TABS: 4.0000 mg | ORAL_TABLET | Freq: Three times a day (TID) | ORAL | 0 refills | Status: AC | PRN

## 2022-01-14 MED ORDER — SCOPOLAMINE 1 MG/3DAYS TD PT72
1.0000 | MEDICATED_PATCH | TRANSDERMAL | 12 refills | Status: DC
Start: 2022-01-14 — End: 2022-08-13

## 2022-01-14 MED ORDER — MOLNUPIRAVIR EUA 200MG CAPSULE: 4.0000 | ORAL_CAPSULE | Freq: Two times a day (BID) | ORAL | 0 refills | Status: AC

## 2022-01-14 MED ORDER — PREDNISONE 10 MG PO TABS: ORAL_TABLET | ORAL | 0 refills | Status: DC

## 2022-01-22 ENCOUNTER — Telehealth: Payer: Self-pay | Admitting: Internal Medicine

## 2022-01-22 NOTE — Telephone Encounter (Signed)
Copied from [place] [id number]. Topic: Medicare AWV >> Jan 22, 2022 11:55 AM Devoria Glassing wrote: Reason for CRM: Left message for patient to schedule Annual Wellness Visit.  Please schedule with Nurse Health Advisor Denisa O'Brien-Blaney, LPN at [facility]. This appt can be telephone or office visit.  Please call [phone] ask for [facility]

## 2022-01-29 ENCOUNTER — Other Ambulatory Visit: Payer: Self-pay | Admitting: Internal Medicine

## 2022-02-07 ENCOUNTER — Other Ambulatory Visit: Payer: Self-pay | Admitting: Internal Medicine

## 2022-02-21 ENCOUNTER — Ambulatory Visit (INDEPENDENT_AMBULATORY_CARE_PROVIDER_SITE_OTHER): Payer: Medicare Other | Admitting: Neurology

## 2022-02-21 ENCOUNTER — Encounter: Payer: Self-pay | Admitting: Neurology

## 2022-02-21 DIAGNOSIS — G43709 Chronic migraine without aura, not intractable, without status migrainosus: Secondary | ICD-10-CM

## 2022-02-21 MED ORDER — ONABOTULINUMTOXINA 200 UNITS IJ SOLR
155.0000 [IU] | Freq: Once | INTRAMUSCULAR | Status: AC
  Administered 2022-02-21: 155 [IU] via INTRAMUSCULAR

## 2022-02-21 MED ORDER — NURTEC 75 MG PO TBDP: 75.0000 mg | ORAL_TABLET | Freq: Every day | ORAL | 0 refills | Status: DC | PRN

## 2022-02-21 MED ORDER — KETOROLAC TROMETHAMINE 60 MG/2ML IM SOLN
60.0000 mg | Freq: Once | INTRAMUSCULAR | Status: AC
  Administered 2022-02-21: 60 mg via INTRAMUSCULAR

## 2022-02-21 NOTE — Progress Notes (Signed)
Consent Form Botulism Toxin Injection For Chronic Migraine   02/21/2022: doing great, stable Interval history 11/22/2021: Still doing great, greater than 80% improvement in frequency of headaches and migraines.  Reviewed orally with patient, additionally signature is on file:  Botulism toxin has been approved by the Federal drug administration for treatment of chronic migraine. Botulism toxin does not cure chronic migraine and it may not be effective in some patients.  The administration of botulism toxin is accomplished by injecting a small amount of toxin into the muscles of the neck and head. Dosage must be titrated for each individual. Any benefits resulting from botulism toxin tend to wear off after 3 months with a repeat injection required if benefit is to be maintained. Injections are usually done every 3-4 months with maximum effect peak achieved by about 2 or 3 weeks. Botulism toxin is expensive and you should be sure of what costs you will incur resulting from the injection.  The side effects of botulism toxin use for chronic migraine may include:   -Transient, and usually mild, facial weakness with facial injections  -Transient, and usually mild, head or neck weakness with head/neck injections  -Reduction or loss of forehead facial animation due to forehead muscle weakness  -Eyelid drooping  -Dry eye  -Pain at the site of injection or bruising at the site of injection  -Double vision  -Potential unknown long term risks  Contraindications: You should not have Botox if you are pregnant, nursing, allergic to albumin, have an infection, skin condition, or muscle weakness at the site of the injection, or have myasthenia gravis, Lambert-Eaton syndrome, or ALS.  It is also possible that as with any injection, there may be an allergic reaction or no effect from the medication. Reduced effectiveness after repeated injections is sometimes seen and rarely infection at the injection site may  occur. All care will be taken to prevent these side effects. If therapy is given over a long time, atrophy and wasting in the muscle injected may occur. Occasionally the patient's become refractory to treatment because they develop antibodies to the toxin. In this event, therapy needs to be modified.  I have read the above information and consent to the administration of botulism toxin.    BOTOX PROCEDURE NOTE FOR MIGRAINE HEADACHE    Contraindications and precautions discussed with patient(above). Aseptic procedure was observed and patient tolerated procedure. Procedure performed by Dr. Georgia Dom  The condition has existed for more than 6 months, and pt does not have a diagnosis of ALS, Myasthenia Gravis or Lambert-Eaton Syndrome.  Risks and benefits of injections discussed and pt agrees to proceed with the procedure.  Written consent obtained  These injections are medically necessary. Pt  receives good benefits from these injections. These injections do not cause sedations or hallucinations which the oral therapies may cause.  Description of procedure:  The patient was placed in a sitting position. The standard protocol was used for Botox as follows, with 5 units of Botox injected at each site:   -Procerus muscle, midline injection  -Corrugator muscle, bilateral injection  -Frontalis muscle, bilateral injection, with 2 sites each side, medial injection was performed in the upper one third of the frontalis muscle, in the region vertical from the medial inferior edge of the superior orbital rim. The lateral injection was again in the upper one third of the forehead vertically above the lateral limbus of the cornea, 1.5 cm lateral to the medial injection site.  -Temporalis muscle injection, 4 sites, bilaterally.  The first injection was 3 cm above the tragus of the ear, second injection site was 1.5 cm to 3 cm up from the first injection site in line with the tragus of the ear. The third  injection site was 1.5-3 cm forward between the first 2 injection sites. The fourth injection site was 1.5 cm posterior to the second injection site.   -Occipitalis muscle injection, 3 sites, bilaterally. The first injection was done one half way between the occipital protuberance and the tip of the mastoid process behind the ear. The second injection site was done lateral and superior to the first, 1 fingerbreadth from the first injection. The third injection site was 1 fingerbreadth superiorly and medially from the first injection site.  -Cervical paraspinal muscle injection, 2 sites, bilateral knee first injection site was 1 cm from the midline of the cervical spine, 3 cm inferior to the lower border of the occipital protuberance. The second injection site was 1.5 cm superiorly and laterally to the first injection site.  -Trapezius muscle injection was performed at 3 sites, bilaterally. The first injection site was in the upper trapezius muscle halfway between the inflection point of the neck, and the acromion. The second injection site was one half way between the acromion and the first injection site. The third injection was done between the first injection site and the inflection point of the neck.   Will return for repeat injection in 3 months.   155 units of Botox was used, 45U Botox not injected was wasted. The patient tolerated the procedure well, there were no complications of the above procedure.

## 2022-02-21 NOTE — Progress Notes (Signed)
Botox- 200 units x 1 vial Lot: [id number] Expiration: 08/2024 NDC: 0888-3584-46  Bacteriostatic 0.9% Sodium Chloride- 62m total Lot: [id number]: 02/07/2023 NDC: [address]-42 Dx: GZ20.094B/B  Order for Toradol 60 mg IM per Dr. AJaynee Eagles Injected '60mg'$ /264mto L upper outer quadrant of L buttock. Tolerated well.  Bandaid applied.

## 2022-02-21 NOTE — Patient Instructions (Signed)
Rimegepant Disintegrating Tablets What is this medication? RIMEGEPANT (ri ME je pant) prevents and treats migraines. It works by blocking a substance in the body that causes migraines. This medicine may be used for other purposes; ask your health care provider or pharmacist if you have questions. COMMON BRAND NAME(S): NURTEC ODT What should I tell my care team before I take this medication? They need to know if you have any of these conditions: Kidney disease Liver disease An unusual or allergic reaction to rimegepant, other medications, foods, dyes, or preservatives Pregnant or trying to get pregnant Breast-feeding How should I use this medication? Take this medication by mouth. Take it as directed on the prescription label. Leave the tablet in the sealed pack until you are ready to take it. With dry hands, open the pack and gently remove the tablet. If the tablet breaks or crumbles, throw it away. Use a new tablet. Place the tablet in the mouth and allow it to dissolve. Then, swallow it. Do not cut, crush, or chew this medication. You do not need water to take this medication. Talk to your care team about the use of this medication in children. Special care may be needed. Overdosage: If you think you have taken too much of this medicine contact a poison control center or emergency room at once. NOTE: This medicine is only for you. Do not share this medicine with others. What if I miss a dose? This does not apply. This medication is not for regular use. What may interact with this medication? Certain medications for fungal infections, such as fluconazole, itraconazole Rifampin This list may not describe all possible interactions. Give your health care provider a list of all the medicines, herbs, non-prescription drugs, or dietary supplements you use. Also tell them if you smoke, drink alcohol, or use illegal drugs. Some items may interact with your medicine. What should I watch for while  using this medication? Visit your care team for regular checks on your progress. Tell your care team if your symptoms do not start to get better or if they get worse. What side effects may I notice from receiving this medication? Side effects that you should report to your care team as soon as possible: Allergic reactions--skin rash, itching, hives, swelling of the face, lips, tongue, or throat Side effects that usually do not require medical attention (report to your care team if they continue or are bothersome): Nausea Stomach pain This list may not describe all possible side effects. Call your doctor for medical advice about side effects. You may report side effects to FDA at 1-800-FDA-1088. Where should I keep my medication? Keep out of the reach of children and pets. Store at room temperature between 20 and 25 degrees C (68 and 77 degrees F). Get rid of any unused medication after the expiration date. To get rid of medications that are no longer needed or have expired: Take the medication to a medication take-back program. Check with your pharmacy or law enforcement to find a location. If you cannot return the medication, check the label or package insert to see if the medication should be thrown out in the garbage or flushed down the toilet. If you are not sure, ask your care team. If it is safe to put it in the trash, take the medication out of the container. Mix the medication with cat litter, dirt, coffee grounds, or other unwanted substance. Seal the mixture in a bag or container. Put it in the trash. NOTE: This   sheet is a summary. It may not cover all possible information. If you have questions about this medicine, talk to your doctor, pharmacist, or health care provider.  2023 Elsevier/Gold Standard (2021-08-16 00:00:00)  

## 2022-03-15 ENCOUNTER — Telehealth: Payer: Self-pay | Admitting: Internal Medicine

## 2022-03-15 NOTE — Telephone Encounter (Signed)
Copied from [place] [id number]. Topic: Medicare AWV >> Mar 15, 2022  2:11 PM Devoria Glassing wrote: Reason for CRM: Left message for patient to schedule Annual Wellness Visit.  Please schedule with Nurse Health Advisor Denisa O'Brien-Blaney, LPN at [facility]. This appt can be telephone or office visit.  Please call [phone] ask for [facility]

## 2022-03-28 ENCOUNTER — Ambulatory Visit (INDEPENDENT_AMBULATORY_CARE_PROVIDER_SITE_OTHER): Payer: Medicare Other | Admitting: Dermatology

## 2022-03-28 ENCOUNTER — Encounter: Payer: Self-pay | Admitting: Dermatology

## 2022-03-28 DIAGNOSIS — L309 Dermatitis, unspecified: Secondary | ICD-10-CM | POA: Diagnosis not present

## 2022-03-28 DIAGNOSIS — D229 Melanocytic nevi, unspecified: Secondary | ICD-10-CM

## 2022-03-28 DIAGNOSIS — Z79899 Other long term (current) drug therapy: Secondary | ICD-10-CM

## 2022-03-28 DIAGNOSIS — Z85828 Personal history of other malignant neoplasm of skin: Secondary | ICD-10-CM

## 2022-03-28 DIAGNOSIS — R21 Rash and other nonspecific skin eruption: Secondary | ICD-10-CM

## 2022-03-28 DIAGNOSIS — L111 Transient acantholytic dermatosis [Grover]: Secondary | ICD-10-CM

## 2022-03-28 DIAGNOSIS — L821 Other seborrheic keratosis: Secondary | ICD-10-CM

## 2022-03-28 DIAGNOSIS — D1801 Hemangioma of skin and subcutaneous tissue: Secondary | ICD-10-CM

## 2022-03-28 DIAGNOSIS — L570 Actinic keratosis: Secondary | ICD-10-CM

## 2022-03-28 DIAGNOSIS — L814 Other melanin hyperpigmentation: Secondary | ICD-10-CM

## 2022-03-28 DIAGNOSIS — L820 Inflamed seborrheic keratosis: Secondary | ICD-10-CM

## 2022-03-28 DIAGNOSIS — Z5111 Encounter for antineoplastic chemotherapy: Secondary | ICD-10-CM

## 2022-03-28 DIAGNOSIS — Z1283 Encounter for screening for malignant neoplasm of skin: Secondary | ICD-10-CM

## 2022-03-28 DIAGNOSIS — L578 Other skin changes due to chronic exposure to nonionizing radiation: Secondary | ICD-10-CM

## 2022-03-28 MED ORDER — MOMETASONE FUROATE 0.1 % EX CREA: 1.0000 | TOPICAL_CREAM | Freq: Every day | CUTANEOUS | 11 refills | Status: DC | PRN

## 2022-03-28 MED ORDER — DOXYCYCLINE HYCLATE 100 MG PO TABS: ORAL_TABLET | ORAL | 0 refills | Status: DC

## 2022-03-28 NOTE — Progress Notes (Signed)
Follow-Up Visit   Subjective  Brianna Calhoun is a 66 y.o. female who presents for the following: Annual Exam (Mole check ). Hx of BCC, hx of SCC. Patient report she had a bullseye rash on the left thigh in July, rash clear today.  The patient presents for Total-Body Skin Exam (TBSE) for skin cancer screening and mole check.  The patient has spots, moles and lesions to be evaluated, some may be new or changing and the patient has concerns that these could be cancer.   The following portions of the chart were reviewed this encounter and updated as appropriate:   Tobacco  Allergies  Meds  Problems  Med Hx  Surg Hx  Fam Hx     Review of Systems:  No other skin or systemic complaints except as noted in HPI or Assessment and Plan.  Objective  Well appearing patient in no apparent distress; mood and affect are within normal limits.  A full examination was performed including scalp, head, eyes, ears, nose, lips, neck, chest, axillae, abdomen, back, buttocks, bilateral upper extremities, bilateral lower extremities, hands, feet, fingers, toes, fingernails, and toenails. All findings within normal limits unless otherwise noted below.  back Red papules and papulovesicles.   left thigh Clear skin   face, trunk, legs x 13 (13) Stuck-on, waxy, tan-brown papules  -- Discussed benign etiology and prognosis.   forehead x 2, lips x 2 (4) Erythematous thin papules/macules with gritty scale.   right upper eyelid margin x 1 Stuck-on, waxy, tan-brown papule -Discussed benign etiology and prognosis.    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  It more commonly presents in the winter and can be associated with atopic dermatitis, renal failure, transplantation and malignancies. It is very difficult to treat and can be persistent and recurrent but topical steroids, calcipotriene cream, antihistamines can be helpful.  For recalcitrant disease, other treatments have been used such as isotretinoin, acitretin, systemic steroids, and Dupixent.    Mometasone cream apply to affected skin qd-bid as needed when active  mometasone (ELOCON) 0.1 % cream - back Apply 1 Application topically daily as needed (Rash).  Hand dermatitis CeraVe moisturizer and mometasone cream up to 5 days a week when needed. Related Medications mometasone (ELOCON) 0.1 % cream Apply 1 application topically daily as needed (Rash).  Rash left thigh Hx of bullseye rash in July on the left thigh-currently clear. Multiple differential diagnoses are possible.  Could have been a bug bite.  If it was a tick bite with erythema chronicum migrans, she could have Lyme disease and therefore will prescribe doxycycline if she wants to take it. Start Doxycyline 100 mg take 1 tablet twice a day for 1 month  Doxycycline should be taken with food to prevent nausea. Do not lay down for 30 minutes after taking. Be cautious with sun exposure and use good sun protection while on this medication. Pregnant women should not take this medication.    Related Medications doxycycline (VIBRA-TABS) 100 MG tablet Take 1 tablet twice a day  with food  Inflamed seborrheic keratosis (13) face, trunk, legs x 13 Symptomatic, irritating, patient would like treated.  Destruction of lesion - face, trunk, legs x 13 Complexity:  simple   Destruction method: cryotherapy   Informed consent: discussed and consent obtained   Timeout:  patient name, date of birth, surgical site, and procedure verified Lesion destroyed using liquid nitrogen: Yes   Region  frozen until ice ball extended beyond lesion: Yes   Outcome: patient tolerated procedure well with no complications   Post-procedure details: wound care instructions given    AK (actinic keratosis) (4) forehead x 2, lips x 2 Actinic keratoses are precancerous spots that appear secondary to cumulative UV radiation exposure/sun exposure over time. They are chronic with expected duration over 1 year. A portion of actinic keratoses will progress to squamous cell carcinoma of the skin. It is not possible to reliably predict which spots will progress to skin cancer and so treatment is recommended to prevent development of skin cancer.  Recommend daily broad spectrum sunscreen SPF 30+ to sun-exposed areas, reapply every 2 hours as needed.  Recommend staying in the shade or wearing long sleeves, sun glasses (UVA+UVB protection) and wide brim hats (4-inch brim around the entire circumference of the hat). Call for new or changing lesions.   Destruction of lesion - forehead x 2, lips x 2 Complexity: simple   Destruction method: cryotherapy   Informed consent: discussed and consent obtained   Timeout:  patient name, date of birth, surgical site, and procedure verified Lesion destroyed using liquid nitrogen: Yes   Region frozen until ice ball extended beyond lesion: Yes   Outcome: patient tolerated procedure well with no complications   Post-procedure details: wound care instructions given    Seborrheic keratosis, inflamed right upper eyelid margin x 1 Symptomatic, irritating, patient would like treated.  Destruction of lesion - right upper eyelid margin x 1 Complexity: simple   Destruction method: cryotherapy   Informed consent: discussed and consent obtained   Timeout:  patient name, date of birth, surgical site, and procedure verified Lesion destroyed using liquid nitrogen: Yes   Region frozen until ice ball extended beyond lesion: Yes   Outcome: patient tolerated procedure well with no  complications   Post-procedure details: wound care instructions given    Lentigines - Scattered tan macules - Due to sun exposure - Benign-appearing, observe - Recommend daily broad spectrum sunscreen SPF 30+ to sun-exposed areas, reapply every 2 hours as needed. - Call for any changes  Seborrheic Keratoses - Stuck-on, waxy, tan-brown papules and/or plaques  - Benign-appearing - Discussed benign etiology and prognosis. - Observe - Call for any changes  Melanocytic Nevi - Tan-brown and/or pink-flesh-colored symmetric macules and papules - Benign appearing on exam today - Observation - Call clinic for new or changing moles - Recommend daily use of broad spectrum spf 30+ sunscreen to sun-exposed areas.   Hemangiomas - Red papules - Discussed benign nature - Observe - Call for any changes  Actinic Damage - Severe, confluent actinic changes with pre-cancerous actinic keratoses  - Severe, chronic, not at goal, secondary to cumulative UV radiation exposure over time - diffuse scaly erythematous macules and papules with underlying dyspigmentation - Discussed Prescription "Field Treatment" for Severe, Chronic Confluent Actinic Changes with Pre-Cancerous Actinic Keratoses  Start 5FU/Calcipotriene cream apply to across the forehead and nose twice a day x 7 days   Field treatment involves treatment of an entire area of skin that has confluent Actinic Changes (Sun/ Ultraviolet light damage) and PreCancerous Actinic Keratoses by method of PhotoDynamic Therapy (PDT) and/or prescription Topical Chemotherapy agents such as 5-fluorouracil, 5-fluorouracil/calcipotriene, and/or imiquimod.  The purpose is to decrease the  number of clinically evident and subclinical PreCancerous lesions to prevent progression to development of skin cancer by chemically destroying early precancer changes that may or may not be visible.  It has been shown to reduce the risk of developing skin cancer in the treated area.  As a result of treatment, redness, scaling, crusting, and open sores may occur during treatment course. One or more than one of these methods may be used and may have to be used several times to control, suppress and eliminate the PreCancerous changes. Discussed treatment course, expected reaction, and possible side effects. - Recommend daily broad spectrum sunscreen SPF 30+ to sun-exposed areas, reapply every 2 hours as needed.  - Staying in the shade or wearing long sleeves, sun glasses (UVA+UVB protection) and wide brim hats (4-inch brim around the entire circumference of the hat) are also recommended. - Call for new or changing lesions.   History of Basal Cell Carcinoma of the Skin Multiple see history - No evidence of recurrence today - Recommend regular full body skin exams - Recommend daily broad spectrum sunscreen SPF 30+ to sun-exposed areas, reapply every 2 hours as needed.  - Call if any new or changing lesions are noted between office visits   History of Squamous Cell Carcinoma of the Skin Left chest 2022 - No evidence of recurrence today - No lymphadenopathy - Recommend regular full body skin exams - Recommend daily broad spectrum sunscreen SPF 30+ to sun-exposed areas, reapply every 2 hours as needed.  - Call if any new or changing lesions are noted between office visits   Skin cancer screening performed today.   Return in about 6 months (around 09/26/2022) for TBSE, hx of SCC, hx of BCC.  IMarye Round, CMA, am acting as scribe for Sarina Ser, MD .  Documentation: I have reviewed the above documentation for accuracy and completeness, and I agree with the above.  Sarina Ser, MD

## 2022-03-28 NOTE — Patient Instructions (Addendum)
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   Apply Vaseline and Band-Aid daily until healed.    Instructions for [facility] Medications  One or more of your medications was sent to the [facility] mail order compounding pharmacy. You will receive an email from them and can purchase the medicine through that link. It will then be mailed to your home at the address you confirmed. If for any reason you do not receive an email from them, please check your spam folder. If you still do not find the email, please let us know. [facility] phone number is [phone].    5-Fluorouracil/Calcipotriene Patient Education  Start in November  Actinic keratoses are the dry, red scaly spots on the skin caused by sun damage. A portion of these spots can turn into skin cancer with time, and treating them can help prevent development of skin cancer.   Treatment of these spots requires removal of the defective skin cells. There are various ways to remove actinic keratoses, including freezing with liquid nitrogen, treatment with creams, or treatment with a blue light procedure in the office.   5-fluorouracil cream is a topical cream used to treat actinic keratoses. It works by interfering with the growth of abnormal fast-growing skin cells, such as actinic keratoses. These cells peel off and are replaced by healthy ones.   5-fluorouracil/calcipotriene is a combination of the 5-fluorouracil cream with a vitamin D analog cream called calcipotriene. The calcipotriene alone does not treat actinic keratoses. However, when it is combined with 5-fluorouracil, it helps the 5-fluorouracil treat the actinic keratoses much faster so that the same results can be achieved with a much shorter treatment time.  INSTRUCTIONS FOR 5-FLUOROURACIL/CALCIPOTRIENE CREAM:   5-fluorouracil/calcipotriene cream typically only  needs to be used for 4-7 days. A thin layer should be applied twice a day to the treatment areas recommended by your physician.   If your physician prescribed you separate tubes of 5-fluourouracil and calcipotriene, apply a thin layer of 5-fluorouracil followed by a thin layer of calcipotriene.   Avoid contact with your eyes, nostrils, and mouth. Do not use 5-fluorouracil/calcipotriene cream on infected or open wounds.   You will develop redness, irritation and some crusting at areas where you have pre-cancer damage/actinic keratoses. IF YOU DEVELOP PAIN, BLEEDING, OR SIGNIFICANT CRUSTING, STOP THE TREATMENT EARLY - you have already gotten a good response and the actinic keratoses should clear up well.  Wash your hands after applying 5-fluorouracil 5% cream on your skin.   A moisturizer or sunscreen with a minimum SPF 30 should be applied each morning.   Once you have finished the treatment, you can apply a thin layer of Vaseline twice a day to irritated areas to soothe and calm the areas more quickly. If you experience significant discomfort, contact your physician.  For some patients it is necessary to repeat the treatment for best results.  SIDE EFFECTS: When using 5-fluorouracil/calcipotriene cream, you may have mild irritation, such as redness, dryness, swelling, or a mild burning sensation. This usually resolves within 2 weeks. The more actinic keratoses you have, the more redness and inflammation you can expect during treatment. Eye irritation has been reported rarely. If this occurs, please let us know.  If you have any trouble using this cream, please call the office. If you have any other questions about this information, please do not hesitate to ask me before you leave  the office.    Due to recent changes in healthcare laws, you may see results of your pathology and/or laboratory studies on MyChart before the doctors have had a chance to review them. We understand that in some cases  there may be results that are confusing or concerning to you. Please understand that not all results are received at the same time and often the doctors may need to interpret multiple results in order to provide you with the best plan of care or course of treatment. Therefore, we ask that you please give [place] 2 business days to thoroughly review all your results before contacting the office for clarification. Should we see a critical lab result, you will be contacted sooner.   If You Need Anything After Your Visit  If you have any questions or concerns for your doctor, please call our main line at [phone] and press option 4 to reach your doctor's medical assistant. If no one answers, please leave a voicemail as directed and we will return your call as soon as possible. Messages left after 4 pm will be answered the following business day.   You may also send [place] a message via [place]. We typically respond to MyChart messages within 1-2 business days.  For prescription refills, please ask your pharmacy to contact our office. Our fax number is [phone].  If you have an urgent issue when the clinic is closed that cannot wait until the next business day, you can page your doctor at the number below.    Please note that while we do our best to be available for urgent issues outside of office hours, we are not available 24/7.   If you have an urgent issue and are unable to reach [place], you may choose to seek medical care at your doctor's office, retail clinic, urgent care center, or emergency room.  If you have a medical emergency, please immediately call 911 or go to the emergency department.  Pager Numbers  - Dr. Nehemiah Massed: [phone]  - Dr. Laurence Ferrari: [phone]  - Dr. Nicole Kindred: [phone]  In the event of inclement weather, please call our main line at [phone] for an update on the status of any delays or closures.  Dermatology Medication Tips: Please keep the boxes that topical  medications come in in order to help keep track of the instructions about where and how to use these. Pharmacies typically print the medication instructions only on the boxes and not directly on the medication tubes.   If your medication is too expensive, please contact our office at [phone] option 4 or send [place] a message through [place].   We are unable to tell what your co-pay for medications will be in advance as this is different depending on your insurance coverage. However, we may be able to find a substitute medication at lower cost or fill out paperwork to get insurance to cover a needed medication.   If a prior authorization is required to get your medication covered by your insurance company, please allow [place] 1-2 business days to complete this process.  Drug prices often vary depending on where the prescription is filled and some pharmacies may offer cheaper prices.  The website www.goodrx.com contains coupons for medications through different pharmacies. The prices here do not account for what the cost may be with help from insurance (it may be cheaper with your insurance), but the website can give you the price if you did not use any insurance.  - You can print the  associated coupon and take it with your prescription to the pharmacy.  - You may also stop by our office during regular business hours and pick up a GoodRx coupon card.  - If you need your prescription sent electronically to a different pharmacy, notify our office through [facility] or by phone at [phone] option 4.     Si Usted Necesita Algo Despus de Su Visita  Tambin puede enviarnos un mensaje a travs de Pharmacist, community. Por lo general respondemos a los mensajes de MyChart en el transcurso de 1 a 2 das hbiles.  Para renovar recetas, por favor pida a su farmacia que se ponga en contacto con nuestra oficina. Harland Dingwall de fax es [place] [phone].  Si tiene un asunto urgente cuando la clnica est  cerrada y que no puede esperar hasta el siguiente da hbil, puede llamar/localizar a su doctor(a) al nmero que aparece a continuacin.   Por favor, tenga en cuenta que aunque hacemos todo lo posible para estar disponibles para asuntos urgentes fuera del horario de [place], no estamos disponibles las 24 horas del da, los 7 das de la [place].   Si tiene un problema urgente y no puede comunicarse con nosotros, puede optar por buscar atencin mdica  en el consultorio de su doctor(a), en una clnica privada, en un centro de atencin urgente o en una sala de emergencias.  Si tiene Engineering geologist, por favor llame inmediatamente al 911 o vaya a la sala de emergencias.  Nmeros de bper  - Dr. Nehemiah Massed: [phone]  - Dra. Moye: [phone]  - Dra. Nicole Kindred: [phone]  En caso de inclemencias del [place], por favor llame a Johnsie Kindred principal al [phone] para una actualizacin sobre el [place] de cualquier retraso o cierre.  Consejos para la medicacin en dermatologa: Por favor, guarde las cajas en las que vienen los medicamentos de uso tpico para ayudarle a seguir las instrucciones sobre dnde y cmo usarlos. Las farmacias generalmente imprimen las instrucciones del medicamento slo en las cajas y no directamente en los tubos del [place].   Si su medicamento es muy caro, por favor, pngase en contacto con Zigmund Daniel llamando al [phone] y presione la opcin 4 o envenos un mensaje a travs de Pharmacist, community.   No podemos decirle cul ser su copago por los medicamentos por adelantado ya que esto es diferente dependiendo de la cobertura de su seguro. Sin embargo, es posible que podamos encontrar un medicamento sustituto a Electrical engineer un formulario para que el seguro cubra el medicamento que se considera necesario.   Si se requiere una autorizacin previa para que su compaa de seguros [place] su medicamento, por favor permtanos de 1 a 2 das hbiles para  completar este proceso.  Los precios de los medicamentos varan con frecuencia dependiendo del Environmental consultant de dnde se surte la receta y alguna farmacias pueden ofrecer precios ms baratos.  El sitio web www.goodrx.com tiene cupones para medicamentos de Airline pilot. Los precios aqu no tienen en cuenta lo que podra costar con la ayuda del seguro (puede ser ms barato con su seguro), pero el sitio web puede darle el precio si no utiliz Research scientist (physical sciences).  - Puede imprimir el cupn correspondiente y llevarlo con su receta a la farmacia.  - Tambin puede pasar por nuestra oficina durante el horario de atencin regular y Charity fundraiser una tarjeta de cupones de GoodRx.  - Si necesita que su receta se enve electrnicamente a Chiropodist, informe a nuestra oficina a  Lawerance Cruel de MyChart de  o por telfono llamando al [phone] y presione la opcin 4.

## 2022-03-30 ENCOUNTER — Encounter: Payer: Self-pay | Admitting: Internal Medicine

## 2022-04-30 ENCOUNTER — Telehealth: Payer: Self-pay | Admitting: Internal Medicine

## 2022-04-30 NOTE — Telephone Encounter (Signed)
Copied from [place] [id number]. Topic: Medicare AWV >> Apr 30, 2022 11:02 AM Devoria Glassing wrote: Reason for CRM: Left message for patient to schedule Annual Wellness Visit.  Please schedule with Nurse Health Advisor Denisa O'Brien-Blaney, LPN at [facility]. This appt can be telephone or office visit.  Please call [phone] ask for [facility]

## 2022-05-01 ENCOUNTER — Encounter: Payer: Self-pay | Admitting: Internal Medicine

## 2022-05-14 ENCOUNTER — Telehealth: Payer: Self-pay

## 2022-05-14 NOTE — Telephone Encounter (Signed)
Botox PA re-verification is due for this year.   Botox: 200 units  Dx code: P66.196   Provider: Dr. Sarina Ill

## 2022-05-15 ENCOUNTER — Other Ambulatory Visit (HOSPITAL_COMMUNITY): Payer: Self-pay

## 2022-05-15 NOTE — Telephone Encounter (Signed)
Patient Advocate Encounter   Received notificationthat prior authorization for Botox 200UNIT solution is required.   PA submitted on 05/15/2022 Key [id number] Status is pending       Lyndel Safe, [place] Patient Advocate Specialist [place] Patient Advocate Team Direct Number: [phone]  Fax: [phone]

## 2022-05-16 ENCOUNTER — Other Ambulatory Visit (HOSPITAL_COMMUNITY): Payer: Self-pay

## 2022-05-16 NOTE — Telephone Encounter (Signed)
Patient Advocate Encounter  Prior Authorization for Botox 200UNIT solution  has been approved.    PA# [id number] Effective dates: 05/15/2022 through further notice      Lyndel Safe, [place] Patient Early Patient Advocate Team Direct Number: [phone]  Fax: [phone]

## 2022-05-17 ENCOUNTER — Other Ambulatory Visit (HOSPITAL_COMMUNITY): Payer: Self-pay

## 2022-05-17 ENCOUNTER — Ambulatory Visit (INDEPENDENT_AMBULATORY_CARE_PROVIDER_SITE_OTHER): Payer: Medicare Other | Admitting: Neurology

## 2022-05-17 DIAGNOSIS — G43709 Chronic migraine without aura, not intractable, without status migrainosus: Secondary | ICD-10-CM

## 2022-05-17 MED ORDER — ONABOTULINUMTOXINA 200 UNITS IJ SOLR
155.0000 [IU] | Freq: Once | INTRAMUSCULAR | Status: AC
  Administered 2022-05-17: 155 [IU] via INTRAMUSCULAR

## 2022-05-17 MED ORDER — KETOROLAC TROMETHAMINE 60 MG/2ML IM SOLN
60.0000 mg | Freq: Once | INTRAMUSCULAR | Status: AC
  Administered 2022-05-17: 60 mg via INTRAMUSCULAR

## 2022-05-17 NOTE — Telephone Encounter (Signed)
Noted  

## 2022-05-17 NOTE — Progress Notes (Signed)
Consent Form Botulism Toxin Injection For Chronic Migraine   05/17/2022: doing great, stable 02/21/2022: doing great, stable Interval history 11/22/2021: Still doing great, greater than 80% improvement in frequency of headaches and migraines.  Reviewed orally with patient, additionally signature is on file:  Botulism toxin has been approved by the Federal drug administration for treatment of chronic migraine. Botulism toxin does not cure chronic migraine and it may not be effective in some patients.  The administration of botulism toxin is accomplished by injecting a small amount of toxin into the muscles of the neck and head. Dosage must be titrated for each individual. Any benefits resulting from botulism toxin tend to wear off after 3 months with a repeat injection required if benefit is to be maintained. Injections are usually done every 3-4 months with maximum effect peak achieved by about 2 or 3 weeks. Botulism toxin is expensive and you should be sure of what costs you will incur resulting from the injection.  The side effects of botulism toxin use for chronic migraine may include:   -Transient, and usually mild, facial weakness with facial injections  -Transient, and usually mild, head or neck weakness with head/neck injections  -Reduction or loss of forehead facial animation due to forehead muscle weakness  -Eyelid drooping  -Dry eye  -Pain at the site of injection or bruising at the site of injection  -Double vision  -Potential unknown long term risks  Contraindications: You should not have Botox if you are pregnant, nursing, allergic to albumin, have an infection, skin condition, or muscle weakness at the site of the injection, or have myasthenia gravis, Lambert-Eaton syndrome, or ALS.  It is also possible that as with any injection, there may be an allergic reaction or no effect from the medication. Reduced effectiveness after repeated injections is sometimes seen and rarely  infection at the injection site may occur. All care will be taken to prevent these side effects. If therapy is given over a long time, atrophy and wasting in the muscle injected may occur. Occasionally the patient's become refractory to treatment because they develop antibodies to the toxin. In this event, therapy needs to be modified.  I have read the above information and consent to the administration of botulism toxin.    BOTOX PROCEDURE NOTE FOR MIGRAINE HEADACHE    Contraindications and precautions discussed with patient(above). Aseptic procedure was observed and patient tolerated procedure. Procedure performed by Dr. Georgia Dom  The condition has existed for more than 6 months, and pt does not have a diagnosis of ALS, Myasthenia Gravis or Lambert-Eaton Syndrome.  Risks and benefits of injections discussed and pt agrees to proceed with the procedure.  Written consent obtained  These injections are medically necessary. Pt  receives good benefits from these injections. These injections do not cause sedations or hallucinations which the oral therapies may cause.  Description of procedure:  The patient was placed in a sitting position. The standard protocol was used for Botox as follows, with 5 units of Botox injected at each site:   -Procerus muscle, midline injection  -Corrugator muscle, bilateral injection  -Frontalis muscle, bilateral injection, with 2 sites each side, medial injection was performed in the upper one third of the frontalis muscle, in the region vertical from the medial inferior edge of the superior orbital rim. The lateral injection was again in the upper one third of the forehead vertically above the lateral limbus of the cornea, 1.5 cm lateral to the medial injection site.  -Temporalis muscle  injection, 4 sites, bilaterally. The first injection was 3 cm above the tragus of the ear, second injection site was 1.5 cm to 3 cm up from the first injection site in line with  the tragus of the ear. The third injection site was 1.5-3 cm forward between the first 2 injection sites. The fourth injection site was 1.5 cm posterior to the second injection site.   -Occipitalis muscle injection, 3 sites, bilaterally. The first injection was done one half way between the occipital protuberance and the tip of the mastoid process behind the ear. The second injection site was done lateral and superior to the first, 1 fingerbreadth from the first injection. The third injection site was 1 fingerbreadth superiorly and medially from the first injection site.  -Cervical paraspinal muscle injection, 2 sites, bilateral knee first injection site was 1 cm from the midline of the cervical spine, 3 cm inferior to the lower border of the occipital protuberance. The second injection site was 1.5 cm superiorly and laterally to the first injection site.  -Trapezius muscle injection was performed at 3 sites, bilaterally. The first injection site was in the upper trapezius muscle halfway between the inflection point of the neck, and the acromion. The second injection site was one half way between the acromion and the first injection site. The third injection was done between the first injection site and the inflection point of the neck.   Will return for repeat injection in 3 months.   155 units of Botox was used, 45U Botox not injected was wasted. The patient tolerated the procedure well, there were no complications of the above procedure.

## 2022-05-17 NOTE — Progress Notes (Signed)
Botox- 200 units x 1 vial Lot: [id number] Expiration: 10/2024 NDC: 3085-6943-70  Bacteriostatic 0.9% Sodium Chloride- 3m total Lot: [id number]: 03/10/2023 NDC: [address]-40 Dx code: GA98.614B/B

## 2022-05-17 NOTE — Progress Notes (Signed)
Pt here for Botox injection. Verbal order for Toradol '60mg'$  IM per Dr. Jaynee Eagles.   Under aseptic technique Toradol '60mg'$ / 70m given IM to R ight upper outer gluteal .  Tolerated well.  Bandaid applied.

## 2022-06-05 ENCOUNTER — Telehealth: Payer: Self-pay | Admitting: Internal Medicine

## 2022-06-05 NOTE — Telephone Encounter (Signed)
Copied from [place] [phone]. Topic: Medicare AWV >> Jun 05, 2022  2:32 PM Devoria Glassing wrote: Reason for CRM: Left message for patient to schedule Annual Wellness Visit.  Please schedule with Nurse Health Advisor Denisa O'Brien-Blaney, LPN at [facility]. This appt can be telephone or office visit.  Please call [phone] ask for [facility]

## 2022-06-20 NOTE — Telephone Encounter (Signed)
MyChart messgae sent to patient. 

## 2022-07-09 ENCOUNTER — Encounter: Payer: Self-pay | Admitting: Internal Medicine

## 2022-07-10 ENCOUNTER — Other Ambulatory Visit: Payer: Self-pay | Admitting: Internal Medicine

## 2022-07-17 ENCOUNTER — Ambulatory Visit: Payer: Medicare Other | Admitting: Physical Therapy

## 2022-07-17 MED ORDER — OSELTAMIVIR PHOSPHATE 75 MG PO CAPS: 75.0000 mg | ORAL_CAPSULE | Freq: Two times a day (BID) | ORAL | 0 refills | Status: DC

## 2022-07-17 MED ORDER — AZITHROMYCIN 250 MG PO TABS: ORAL_TABLET | ORAL | 0 refills | Status: AC

## 2022-07-25 ENCOUNTER — Encounter: Payer: Self-pay | Admitting: Physical Therapy

## 2022-07-25 ENCOUNTER — Ambulatory Visit: Payer: Medicare Other | Attending: Specialist | Admitting: Physical Therapy

## 2022-07-25 DIAGNOSIS — M533 Sacrococcygeal disorders, not elsewhere classified: Secondary | ICD-10-CM | POA: Diagnosis present

## 2022-07-25 DIAGNOSIS — M25561 Pain in right knee: Secondary | ICD-10-CM | POA: Diagnosis present

## 2022-07-25 DIAGNOSIS — M2170 Unequal limb length (acquired), unspecified site: Secondary | ICD-10-CM | POA: Insufficient documentation

## 2022-07-25 DIAGNOSIS — M542 Cervicalgia: Secondary | ICD-10-CM | POA: Insufficient documentation

## 2022-07-25 DIAGNOSIS — M545 Low back pain, unspecified: Secondary | ICD-10-CM | POA: Insufficient documentation

## 2022-07-25 DIAGNOSIS — G8929 Other chronic pain: Secondary | ICD-10-CM | POA: Insufficient documentation

## 2022-07-25 DIAGNOSIS — M25562 Pain in left knee: Secondary | ICD-10-CM | POA: Insufficient documentation

## 2022-07-25 NOTE — Therapy (Addendum)
OUTPATIENT PHYSICAL THERAPY EVALUATION   Patient Name: Brianna Calhoun MRN: [id number] DOB:16-Oct-1955, 67 y.o., female Today's Date: 07/25/2022   PT End of Session - 07/25/22 0903     Visit Number 1    Number of Visits 10    Date for PT Re-Evaluation 10/03/22   eval 6/28   PT Start Time 0846    PT Stop Time 0930    PT Time Calculation (min) 44 min    Activity Tolerance Patient tolerated treatment well;No increased pain    Behavior During Therapy [facility] for tasks assessed/performed             Past Medical History:  Diagnosis Date   Actinic keratosis    Basal cell carcinoma 04/13/2008   Right temple   Basal cell carcinoma 10/07/2019   Right chest   Basal cell carcinoma (BCC) of face 03/08/2009   Right temple, recurrent   Basal cell carcinoma (BCC) of face 11/28/2015   Right temple, hairline. Nodular pattern   Basal cell carcinoma of trunk 03/29/2014   Right paraspinal upper back. Superficial    Cancer ([place])    skin   GERD (gastroesophageal reflux disease)    Hyperlipidemia    Migraine    Squamous cell carcinoma of skin 05/24/2021   Left chest [facility]   Past Surgical History:  Procedure Laterality Date   CERVICAL CERCLAGE     6/84, 1/90, 8/93 under epidurals   [place], [place]   COLONOSCOPY N/A 12/13/2014   Procedure: COLONOSCOPY;  Surgeon: Manya Silvas, MD;  Location: [place];  Service: Endoscopy;  Laterality: N/A;   EYE SURGERY  1998   HERNIA REPAIR  1996   left hernia repair   LEFT OOPHORECTOMY Left 2005   multiple complex cysts   llq incisional hernia repair & mini-tummy tuck  07/1994   nasal surgeries     9/87, 9/00, 11/04; deviated septum, ethmoidectomy, rhinoplasty, rhinoplasty revision   Patient Active Problem List   Diagnosis Date Noted   Osteopenia after menopause 10/04/2021   Urge incontinence 10/21/2020   Elevated blood pressure reading in office with white coat syndrome, without diagnosis of  hypertension 10/21/2020   Arthralgia of both hands 10/21/2020   Urinary frequency 10/21/2020   Perineal irritation in female 01/17/2020   Hyperplastic polyp of large intestine 01/15/2020   Diverticulosis of colon 01/15/2020   Internal hemorrhoid, bleeding 01/15/2020   Chronic migraine without aura without status migrainosus, not intractable 09/21/2019   History of migraine headaches 07/30/2019   Educated about COVID-19 virus infection 01/16/2019   Statin intolerance 08/08/2017   Encounter for preventive health examination 06/03/2017   Hyperlipidemia LDL goal <130 10/16/2016   Anxiety state 10/16/2016   Allergy to iodinated contrast media 10/16/2016    PCP: Derrel Nip   REFERRING PROVIDER: Marcelino Freestone DIAG:  M17.0 (ICD-10-CM) - Bilateral primary osteoarthritis of knee  M54.30 (ICD-10-CM) - Sciatica, unspecified side    Rationale for Evaluation and Treatment Rehabilitation  THERAPY DIAG:  Chronic right-sided low back pain without sciatica  Leg length discrepancy  Sacrococcygeal disorders, not elsewhere classified  Neck pain  Chronic pain of left knee  Chronic pain of right knee  ONSET DATE:  end of March 2023  SUBJECTIVE:  SUBJECTIVE STATEMENT: 1) B knee pain:  Pt was playing pickleball and she came down on her R knee and something catch in her SI joint and it radiated down her back of her knee.  Then her knee started hurting. Xrays and MRI showed inflammation, nothing was torn. Currently there is pain behind knee cap with up and down stairs.  R is worst than L. 7/10. Pain eases in an inconsistent day.   2) LBP:  started in June ( 6 months ago) after knees started hurt. This started 3 days after RSV vaccine. She could move but she had to tell her brain to pick legs up.  Pt got COVID in  Aug-Sept. Pt noticed Covid related Sx : breathing in a cloud. Pt took a Z pack and it cleared up. Pt thinks she had lng-Covid for two weeks. Pt noticed there is no pattern to when back pain occurs. It does occur in the monring but there are days when she does not have it. Pt has gone skiing and she had no LBP nor kneepain the next day. She feels the knee pain and LBP are interwined and activities help relief the pain at both areas. Pt stopped wearing her shoe lift for past 6 months.  Achiness occurred with sleeping from the back of her back to the back of her knee but it  has not happened again in the last several weeks.   3) neck pain   PERTINENT HISTORY:  Pt will be going on a cruise Feb 15-25 .  Pt completed PT Sept 2022 and felt no LBP , vaginal pressure ,  urinary urgency , nor hemorrhoids since completing PT. Pt goes to the gym and modifies her routine and avoids crunches. Pt has picked back up doing her deep core exercises.   PAIN:  Are you having pain? See above   PRECAUTIONS: None  WEIGHT BEARING RESTRICTIONS: No  FALLS:  Has patient fallen in last 6 months? 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  PLOF: Independent  PATIENT GOALS:  Get rid of pain and be ready for pickleball season and cruise    OBJECTIVE:   [facility] PT Assessment - 07/25/22 0922       Sit to Stand   Comments uses B UE, poor mechanics,      Strength   Overall Strength Comments hip R flexion 3/5, L 4/5,  B hip flexion 3/5, L hip abd 3/5, R 3 +/5,  hip ext B 3/5, coordination of multidifis, glut, hamstring: lacked glut activation secondary in sequence      Palpation   Spinal mobility without shoe lift:  radiating pain at glut R with R sideflexion    SI assessment  without shoe lift : R iliac crest lowered, ( with shoe lift in R shoe: levelled                  HOME EXERCISE PROGRAM: See pt instruction section    ASSESSMENT:  CLINICAL IMPRESSION:                Pt is a 67  yo who presents with R LBP, B knee pain, neck pain   which impact QOL, ADL, fitness, and community activities.   Pt's musculoskeletal assessment revealed uneven pelvic girdle and shoulder height, asymmetries to gait pattern, limited spinal /pelvic mobility, hip weakness, poor  body mechanics which places strain on the abdominal/pelvic floor mm. These are deficits that indicate an ineffective intraabdominal pressure system associated with increased risk for pt's Sx.    Regional interdependent approaches will yield greater benefits in pt's POC.   Following Tx today which pt tolerated without complaints, pt demo'd equal alignment of pelvic girdle with shoe lift placed in R shoe. Pt was educated how to put shoe lift properly in shoe, explained importance of compliance to deep core HEP. Educated and cued for proper technique for sit to stand to utilize glut strength which were weak bilaterally. Pt was explained the importance of wearing shoe lift due to R leg length difference and related changes to spinal deviations as a result of not wearing them the past 12month which is associated with the onset of her CLBP.  Plan to assess lower kinetic chain to address knee pain with stairs at next session.     Pt benefits from skilled PT.    OBJECTIVE IMPAIRMENTS decreased activity tolerance, decreased coordination, decreased endurance, decreased mobility, difficulty walking, decreased ROM, decreased strength, decreased safety awareness, hypomobility, increased muscle spasms, impaired flexibility, improper body mechanics, postural dysfunction, and pain.    ACTIVITY LIMITATIONS  self-care, physical activities, home chores, work tasks    PARTICIPATION LIMITATIONS:  community, gym activities    PERSONAL FACTORS   leg length difference is  affecting patient's functional outcome.     REHAB POTENTIAL: Good   CLINICAL DECISION MAKING: Evolving/moderate complexity   EVALUATION COMPLEXITY: Moderate    PATIENT EDUCATION:    Education details: Showed pt anatomy images. Explained muscles attachments/ connection, physiology of deep core system/ spinal- thoracic-pelvis-lower kinetic chain as they relate to pt's presentation, Sx, and past Hx. Explained what and how these areas of deficits need to be restored to balance and function    See Therapeutic activity / neuromuscular re-education section  Answered pt's questions.   Person educated: Patient Education method: Explanation, Demonstration, Tactile cues, Verbal cues, and Handouts Education comprehension: verbalized understanding, returned demonstration, verbal cues required, tactile cues required, and needs further education     PLAN: PT FREQUENCY: 1x/week   PT DURATION: 10 weeks   PLANNED INTERVENTIONS: Therapeutic exercises, Therapeutic activity, Neuromuscular re-education, Balance training, Gait training, Patient/Family education, Self Care, Joint mobilization, Spinal mobilization, Moist heat, Taping, and Manual therapy, dry needling.   PLAN FOR NEXT SESSION: See clinical impression for plan     GOALS: Goals reviewed with patient? Yes  SHORT TERM GOALS: Target date:08/22/2022    Pt will demo IND with HEP                    Baseline: Not IND            Goal status: INITIAL   LONG TERM GOALS: Target date: 10/03/2022    1.Pt will demo proper deep core coordination without chest breathing and optimal excursion of diaphragm/pelvic floor in order to promote spinal stability and pelvic floor function  Baseline: dyscoordination Goal status: INITIAL  2.  Pt will demo > 5 pt change on FOTO  to improve QOL and function   Lumber baseline  -   Goal status: INITIAL  3.  Pt will demo proper body mechanics in against gravity tasks and ADLs  work tasks, fitness  to minimize straining pelvic floor / back                   Baseline: not IND, improper form that places strain on pelvic  floor                Goal status: INITIAL    4. Pt will demo increased hip strength > 45 bilaterally in order to minimize relapse of pain   Baseline:   hip R flexion 3/5, L 4/5,   B hip flexion 3/5, L hip abd 3/5, R 3 +/5, hip ext B 3/5,   coordination of multidifis, glut, hamstring: lacked glut activation secondary in sequence   Goal status: INITIAL    5. Pt will demo sit to stand without UE support on chair arm without cues across 2 visits in order to demo proper strength and body awareness to minimize fall risks  Baseline:  uses B UE on armrest  Goal status: INITIAL   6.  Baseline:  Goal status: INITIAL     Jerl Mina, PT 07/25/2022, 9:07 AM

## 2022-07-25 NOTE — Patient Instructions (Signed)
   Clam Shell 45 Degrees  Lying with hips and knees bent 45, one pillow between knees and ankles. Heel together, toes apart like ballerina,  Lift knee with exhale while pressing heels together. Be sure pelvis does not roll backward. Do not arch back. Do 20 times  R, 30 rep L , 2 times per day.    Complimentary stretch:   Alternative if hip is tight,   Sit at 45 deg turn with R leg and knee on edge of chair/ bench, L buttock hanging off the edge to bring the L foot back like a lunge, toes bent, lower heel to feel quad stretch,  pay attention to keeping pinky and first toe ballmound planted to align toes forward so ankles are not twerked   Repeat with other side   ___

## 2022-07-26 NOTE — Addendum Note (Signed)
Addended by: Jerl Mina on: 07/26/2022 03:44 PM   Modules accepted: Orders

## 2022-07-30 ENCOUNTER — Ambulatory Visit: Payer: Medicare Other | Admitting: Physical Therapy

## 2022-07-30 DIAGNOSIS — G8929 Other chronic pain: Secondary | ICD-10-CM

## 2022-07-30 DIAGNOSIS — M2170 Unequal limb length (acquired), unspecified site: Secondary | ICD-10-CM | POA: Diagnosis not present

## 2022-07-30 DIAGNOSIS — M533 Sacrococcygeal disorders, not elsewhere classified: Secondary | ICD-10-CM

## 2022-07-30 NOTE — Patient Instructions (Signed)
WITH PICKLEBALL PRE AND POST PLAYING     Lengthen Back rib by R  shoulder   ( winging)    Lie on L  side , pillow between knees and under head  Pull  R arm overhead over mattress, grab the edge of mattress,pull it upward, drawing elbow away from ears  Breathing 10 reps  Brushing arm with 3/4 turn onto pillow behind back  Lying on L  side ,Pillow/ Block between knees     dragging top forearm across ribs below breast rotating 3/4 turn,  rotating  _R_ only this week ,  relax onto the pillow behind the back  and then back to other palm , maintain top palm on body whole top and not lift shoulder   Wall 12-3 oclock stretch

## 2022-07-30 NOTE — Therapy (Signed)
OUTPATIENT PHYSICAL THERAPY  Treatment   Patient Name: Brianna Calhoun MRN: [id number] DOB:02/20/1956, 67 y.o., female Today's Date: 07/30/2022   PT End of Session - 07/30/22 0851     Visit Number 2    Number of Visits 10    Date for PT Re-Evaluation 10/03/22   eval 6/28   PT Start Time 0850    PT Stop Time 0930    PT Time Calculation (min) 40 min    Activity Tolerance Patient tolerated treatment well;No increased pain    Behavior During Therapy [facility] for tasks assessed/performed             Past Medical History:  Diagnosis Date   Actinic keratosis    Basal cell carcinoma 04/13/2008   Right temple   Basal cell carcinoma 10/07/2019   Right chest   Basal cell carcinoma (BCC) of face 03/08/2009   Right temple, recurrent   Basal cell carcinoma (BCC) of face 11/28/2015   Right temple, hairline. Nodular pattern   Basal cell carcinoma of trunk 03/29/2014   Right paraspinal upper back. Superficial    Cancer ([place])    skin   GERD (gastroesophageal reflux disease)    Hyperlipidemia    Migraine    Squamous cell carcinoma of skin 05/24/2021   Left chest [facility]   Past Surgical History:  Procedure Laterality Date   CERVICAL CERCLAGE     6/84, 1/90, 8/93 under epidurals   [place], [place]   COLONOSCOPY N/A 12/13/2014   Procedure: COLONOSCOPY;  Surgeon: Manya Silvas, MD;  Location: [place];  Service: Endoscopy;  Laterality: N/A;   EYE SURGERY  1998   HERNIA REPAIR  1996   left hernia repair   LEFT OOPHORECTOMY Left 2005   multiple complex cysts   llq incisional hernia repair & mini-tummy tuck  07/1994   nasal surgeries     9/87, 9/00, 11/04; deviated septum, ethmoidectomy, rhinoplasty, rhinoplasty revision   Patient Active Problem List   Diagnosis Date Noted   Osteopenia after menopause 10/04/2021   Urge incontinence 10/21/2020   Elevated blood pressure reading in office with white coat syndrome, without diagnosis of  hypertension 10/21/2020   Arthralgia of both hands 10/21/2020   Urinary frequency 10/21/2020   Perineal irritation in female 01/17/2020   Hyperplastic polyp of large intestine 01/15/2020   Diverticulosis of colon 01/15/2020   Internal hemorrhoid, bleeding 01/15/2020   Chronic migraine without aura without status migrainosus, not intractable 09/21/2019   History of migraine headaches 07/30/2019   Educated about COVID-19 virus infection 01/16/2019   Statin intolerance 08/08/2017   Encounter for preventive health examination 06/03/2017   Hyperlipidemia LDL goal <130 10/16/2016   Anxiety state 10/16/2016   Allergy to iodinated contrast media 10/16/2016    PCP: Derrel Nip   REFERRING PROVIDER: Marcelino Freestone DIAG:  M17.0 (ICD-10-CM) - Bilateral primary osteoarthritis of knee  M54.30 (ICD-10-CM) - Sciatica, unspecified side    Rationale for Evaluation and Treatment Rehabilitation  THERAPY DIAG:  Chronic right-sided low back pain without sciatica  Leg length discrepancy  Sacrococcygeal disorders, not elsewhere classified  ONSET DATE:  end of March 2023  SUBJECTIVE:  SUBJECTIVE STATEMENT TODAY:  Pt walked a lot in [place] and only noticed an ache in her R SI joint. Her L knee still feels achey but not the burning sensation anymore. Pt wore her shoe lift all last week. No radiating pain down the back of her knee.   SUBJECTIVE STATEMENT on EVAL 07/25/22  1) B knee pain:  Pt was playing pickleball and she came down on her R knee and something catch in her SI joint and it radiated down her back of her knee.  Then her knee started hurting. Xrays and MRI showed inflammation, nothing was torn. Currently there is pain behind knee cap with up and down stairs.  R is worst than L. 7/10. Pain eases in an  inconsistent day.   2) LBP:  started in June ( 6 months ago) after knees started hurt. This started 3 days after RSV vaccine. She could move but she had to tell her brain to pick legs up.  Pt got COVID in Aug-Sept. Pt noticed Covid related Sx : breathing in a cloud. Pt took a Z pack and it cleared up. Pt thinks she had lng-Covid for two weeks. Pt noticed there is no pattern to when back pain occurs. It does occur in the monring but there are days when she does not have it. Pt has gone skiing and she had no LBP nor kneepain the next day. She feels the knee pain and LBP are interwined and activities help relief the pain at both areas. Pt stopped wearing her shoe lift for past 6 months.  Achiness occurred with sleeping from the back of her back to the back of her knee but it  has not happened again in the last several weeks.   3) neck pain   PERTINENT HISTORY:  Pt will be going on a cruise Feb 15-25 .  Pt completed PT Sept 2022 and felt no LBP , vaginal pressure ,  urinary urgency , nor hemorrhoids since completing PT. Pt goes to the gym and modifies her routine and avoids crunches. Pt has picked back up doing her deep core exercises.   PAIN:  Are you having pain? See above   PRECAUTIONS: None  WEIGHT BEARING RESTRICTIONS: No  FALLS:  Has patient fallen in last 6 months? 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  PLOF: Independent  PATIENT GOALS:  Get rid of pain and be ready for pickleball season and cruise    OBJECTIVE:    [facility] PT Assessment - 07/30/22 0901       Observation/Other Assessments   Observations R shoulder slightly lowered, limited anterior rotation of R shoulder      Coordination   Coordination and Movement Description R scapula winging with shoulder downward movement  , limited lateral and anterior excursion of R ribs       Palpation   Spinal mobility hypomobile T7-10 R, interspinals, paraspinal, intercostals,      Ambulation/Gait   Gait Comments with shoe lift in R shoe:  1.24 m/s with reciporcal gait , R thorax with limited anterior rotation , limited arm swings             [facility] Adult PT Treatment/Exercise - 07/30/22 9211       Therapeutic Activites    Other Therapeutic Activities Explained importance of complimentary stretches to R shoulder and midback pre and  post pickle ball playing      Neuro Re-ed    Neuro Re-ed Details  cued for new  HEP      Modalities   Modalities Moist Heat      Moist Heat Therapy   Number Minutes Moist Heat 5 Minutes    Moist Heat Location --   thoracic, R UE quadrant with posterior (unbilled) rotation     Manual Therapy   Internal Pelvic Floor STM/MWM at problem areas noted in assessment to promote R scapular mobility and intercostal / diaphragm excursion on R               HOME EXERCISE PROGRAM: See pt instruction section    ASSESSMENT:  CLINICAL IMPRESSION:                Pt demo'd equal alignment of pelvic girdle as pt wore her shoe lift all last week. No radiating pain down the back of her knee occurred last week. Applied manual Tx to address R trunk lean / limited diaphragmatic excursion/ scapular winging on R due to scoliosis and pickleball playing as pt is R handed with use of her racket. Pt showed increased thoracic / scapular mobility and R lateral diaphragm excursion post Tx with more levelled shoulder height. Plan to assess lower kinetic chain to address knee pain with stairs at next session.     Pt benefits from skilled PT.    OBJECTIVE IMPAIRMENTS decreased activity tolerance, decreased coordination, decreased endurance, decreased mobility, difficulty walking, decreased ROM, decreased strength, decreased safety awareness, hypomobility, increased muscle spasms, impaired flexibility, improper body mechanics, postural dysfunction, and  pain.    ACTIVITY LIMITATIONS  self-care, physical activities, home chores, work tasks    PARTICIPATION LIMITATIONS:  community, gym activities    PERSONAL FACTORS   leg length difference is  affecting patient's functional outcome.    REHAB POTENTIAL: Good   CLINICAL DECISION MAKING: Evolving/moderate complexity   EVALUATION COMPLEXITY: Moderate    PATIENT EDUCATION:    Education details: Showed pt anatomy images. Explained muscles attachments/ connection, physiology of deep core system/ spinal- thoracic-pelvis-lower kinetic chain as they relate to pt's presentation, Sx, and past Hx. Explained what and how these areas of deficits need to be restored to balance and function    See Therapeutic activity / neuromuscular re-education section  Answered pt's questions.   Person educated: Patient Education method: Explanation, Demonstration, Tactile cues, Verbal cues, and Handouts Education comprehension: verbalized understanding, returned demonstration, verbal cues required, tactile cues required, and needs further education     PLAN: PT FREQUENCY: 1x/week   PT DURATION: 10 weeks   PLANNED INTERVENTIONS: Therapeutic exercises, Therapeutic activity, Neuromuscular re-education, Balance training, Gait training, Patient/Family education, Self Care, Joint mobilization, Spinal mobilization, Moist heat, Taping, and Manual therapy, dry needling.   PLAN FOR NEXT SESSION: See clinical impression for plan     GOALS: Goals reviewed with patient? Yes  SHORT TERM GOALS: Target date:08/22/2022    Pt will demo IND with HEP                    Baseline: Not IND            Goal status: INITIAL   LONG TERM GOALS: Target date: 10/03/2022    1.Pt will demo proper deep core coordination without chest breathing and optimal excursion of diaphragm/pelvic floor in order to promote spinal stability and pelvic floor function  Baseline: dyscoordination Goal status: INITIAL  2.  Pt will demo >  5  pt change on FOTO  to improve QOL and function   Lumber baseline  -   Goal status: INITIAL  3.  Pt will demo proper body mechanics in against gravity tasks and ADLs  work tasks, fitness  to minimize straining pelvic floor / back                  Baseline: not IND, improper form that places strain on pelvic floor                Goal status: INITIAL    4. Pt will demo increased hip strength > 45 bilaterally in order to minimize relapse of pain   Baseline:   hip R flexion 3/5, L 4/5,   B hip flexion 3/5, L hip abd 3/5, R 3 +/5, hip ext B 3/5,   coordination of multidifis, glut, hamstring: lacked glut activation secondary in sequence   Goal status: INITIAL    5. Pt will demo sit to stand without UE support on chair arm without cues across 2 visits in order to demo proper strength and body awareness to minimize fall risks  Baseline:  uses B UE on armrest  Goal status: INITIAL   6.  Baseline:  Goal status: INITIAL     Jerl Mina, PT 07/30/2022, 8:58 AM

## 2022-07-30 NOTE — Therapy (Deleted)
[place] at [facility] [place], [place], [address] Phone: [phone]   Fax:  [phone]  July 30, 2022   No Recipients  Physical Therapy Discharge Summary  Patient: Brianna Calhoun  MRN: [id number]  Date of Birth: [birth year]   Diagnosis: Chronic right-sided low back pain without sciatica  Leg length discrepancy  Sacrococcygeal disorders, not elsewhere classified No data recorded  The above patient had been seen in Physical Therapy *** times of *** treatments scheduled with *** no shows and *** cancellations.  The treatment consisted of *** The patient is: {improved/worse/unchanged:[id number]}  Subjective: ***  Discharge Findings: ***  Functional Status at Discharge: ***  {JSRPR:[id number]}    Sincerely,   Jerl Mina, PT   CC No Recipients  [place] at [facility] [place], [place], [address] Phone: [phone]   Fax:  [phone]  Patient: SHANIKIA KERNODLE  MRN: [id number]  Date of Birth: [birth year]

## 2022-08-01 ENCOUNTER — Encounter: Payer: Medicare Other | Admitting: Physical Therapy

## 2022-08-06 ENCOUNTER — Ambulatory Visit: Payer: Medicare Other | Admitting: Physical Therapy

## 2022-08-06 DIAGNOSIS — M2170 Unequal limb length (acquired), unspecified site: Secondary | ICD-10-CM

## 2022-08-06 DIAGNOSIS — G8929 Other chronic pain: Secondary | ICD-10-CM

## 2022-08-06 DIAGNOSIS — M542 Cervicalgia: Secondary | ICD-10-CM

## 2022-08-06 DIAGNOSIS — M533 Sacrococcygeal disorders, not elsewhere classified: Secondary | ICD-10-CM

## 2022-08-06 DIAGNOSIS — M545 Low back pain, unspecified: Secondary | ICD-10-CM

## 2022-08-06 NOTE — Therapy (Signed)
OUTPATIENT PHYSICAL THERAPY  Treatment   Patient Name: Brianna Calhoun MRN: [id number] DOB:05/30/1956, 67 y.o., female Today's Date: 08/06/2022   PT End of Session - 08/06/22 0852     Visit Number 3    Number of Visits 10    Date for PT Re-Evaluation 10/03/22   eval 6/28   PT Start Time 0850    PT Stop Time 0930    PT Time Calculation (min) 40 min    Activity Tolerance Patient tolerated treatment well;No increased pain    Behavior During Therapy [facility] for tasks assessed/performed             Past Medical History:  Diagnosis Date   Actinic keratosis    Basal cell carcinoma 04/13/2008   Right temple   Basal cell carcinoma 10/07/2019   Right chest   Basal cell carcinoma (BCC) of face 03/08/2009   Right temple, recurrent   Basal cell carcinoma (BCC) of face 11/28/2015   Right temple, hairline. Nodular pattern   Basal cell carcinoma of trunk 03/29/2014   Right paraspinal upper back. Superficial    Cancer ([place])    skin   GERD (gastroesophageal reflux disease)    Hyperlipidemia    Migraine    Squamous cell carcinoma of skin 05/24/2021   Left chest [facility]   Past Surgical History:  Procedure Laterality Date   CERVICAL CERCLAGE     6/84, 1/90, 8/93 under epidurals   [place], [place]   COLONOSCOPY N/A 12/13/2014   Procedure: COLONOSCOPY;  Surgeon: Manya Silvas, MD;  Location: [place];  Service: Endoscopy;  Laterality: N/A;   EYE SURGERY  1998   HERNIA REPAIR  1996   left hernia repair   LEFT OOPHORECTOMY Left 2005   multiple complex cysts   llq incisional hernia repair & mini-tummy tuck  07/1994   nasal surgeries     9/87, 9/00, 11/04; deviated septum, ethmoidectomy, rhinoplasty, rhinoplasty revision   Patient Active Problem List   Diagnosis Date Noted   Osteopenia after menopause 10/04/2021   Urge incontinence 10/21/2020   Elevated blood pressure reading in office with white coat syndrome, without diagnosis of  hypertension 10/21/2020   Arthralgia of both hands 10/21/2020   Urinary frequency 10/21/2020   Perineal irritation in female 01/17/2020   Hyperplastic polyp of large intestine 01/15/2020   Diverticulosis of colon 01/15/2020   Internal hemorrhoid, bleeding 01/15/2020   Chronic migraine without aura without status migrainosus, not intractable 09/21/2019   History of migraine headaches 07/30/2019   Educated about COVID-19 virus infection 01/16/2019   Statin intolerance 08/08/2017   Encounter for preventive health examination 06/03/2017   Hyperlipidemia LDL goal <130 10/16/2016   Anxiety state 10/16/2016   Allergy to iodinated contrast media 10/16/2016    PCP: Derrel Nip   REFERRING PROVIDER: Marcelino Freestone DIAG:  M17.0 (ICD-10-CM) - Bilateral primary osteoarthritis of knee  M54.30 (ICD-10-CM) - Sciatica, unspecified side    Rationale for Evaluation and Treatment Rehabilitation  THERAPY DIAG:  Chronic right-sided low back pain without sciatica  Leg length discrepancy  Sacrococcygeal disorders, not elsewhere classified  Neck pain  Chronic pain of left knee  Chronic pain of right knee  ONSET DATE:  end of March 2023  SUBJECTIVE:  SUBJECTIVE STATEMENT TODAY:  Pt  experienced 0-2/10 level of pain after last session and was able to play for 3 hours of pickleball in a tournament without pain during and after.  Then pain was at a level of 6/10 after sleeping with grandkids in her bed and she did not sleep well. Prior to PT, pt experienced her pain at 8/10. Pain is located at upper R lumbar area  SUBJECTIVE STATEMENT on EVAL 07/25/22  1) B knee pain:  Pt was playing pickleball and she came down on her R knee and something catch in her SI joint and it radiated down her back of her knee.  Then her  knee started hurting. Xrays and MRI showed inflammation, nothing was torn. Currently there is pain behind knee cap with up and down stairs.  R is worst than L. 7/10. Pain eases in an inconsistent day.   2) LBP:  started in June ( 6 months ago) after knees started hurt. This started 3 days after RSV vaccine. She could move but she had to tell her brain to pick legs up.  Pt got COVID in Aug-Sept. Pt noticed Covid related Sx : breathing in a cloud. Pt took a Z pack and it cleared up. Pt thinks she had lng-Covid for two weeks. Pt noticed there is no pattern to when back pain occurs. It does occur in the monring but there are days when she does not have it. Pt has gone skiing and she had no LBP nor kneepain the next day. She feels the knee pain and LBP are interwined and activities help relief the pain at both areas. Pt stopped wearing her shoe lift for past 6 months.  Achiness occurred with sleeping from the back of her back to the back of her knee but it  has not happened again in the last several weeks.   3) neck pain   PERTINENT HISTORY:  Pt will be going on a cruise Feb 15-25 .  Pt completed PT Sept 2022 and felt no LBP , vaginal pressure ,  urinary urgency , nor hemorrhoids since completing PT. Pt goes to the gym and modifies her routine and avoids crunches. Pt has picked back up doing her deep core exercises.   PAIN:  Are you having pain? See above   PRECAUTIONS: None  WEIGHT BEARING RESTRICTIONS: No  FALLS:  Has patient fallen in last 6 months? 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 paraspinals on R thoracic, hypermobile T12, L1,, deviated L , Tightenss L medial scapular attachments             [facility] Adult PT Treatment/Exercise - 08/06/22 3500       Neuro Re-ed    Neuro Re-ed Details  cued for cervical/ scapular retraction and glut strengthening for thoracolumbar/ glut strengthening/ lengthening of flank space a/ modified from childs pose due to kneepain      Modalities   Modalities Moist Heat      Moist Heat Therapy   Number Minutes Moist Heat 4 Minutes    Moist Heat Location --   throacic in prone ( unbilled)     Manual Therapy   Internal Pelvic Floor STM/MWM at problem areas noted in assessment to promote optimal diaphragmatic excursion and minimzie R protracted shoulder from pickleball playing              HOME EXERCISE PROGRAM: See pt instruction section    ASSESSMENT:  CLINICAL IMPRESSION:                 Pt played pickleball for 3 hours at a tournament and had no LBP during nor afterwards.   Pt demo'd thoracic shift to R with lowered L shoulder today while pelvic girdle is levelled. Addressed this deviation with manual Tx after which , shoulders were levelled and thoracic scoliotic curve was more realigned. Pt reported decreased pain at R upper lumbar from 6/10 to 3/10.    Added more thoracolumbar and glut strengthening to improved RLE strengthening and improved posture.    Plan to assess lower kinetic chain to address knee pain with stairs at next session.   Pt benefits from skilled PT.    OBJECTIVE IMPAIRMENTS decreased activity tolerance, decreased coordination, decreased endurance, decreased mobility, difficulty walking, decreased ROM, decreased strength,  decreased safety awareness, hypomobility, increased muscle spasms, impaired flexibility, improper body mechanics, postural dysfunction, and pain.    ACTIVITY LIMITATIONS  self-care, physical activities, home chores, work tasks    PARTICIPATION LIMITATIONS:  community, gym activities    PERSONAL FACTORS   leg length difference is  affecting patient's functional outcome.    REHAB POTENTIAL: Good   CLINICAL DECISION MAKING: Evolving/moderate complexity   EVALUATION COMPLEXITY: Moderate    PATIENT EDUCATION:    Education details: Showed pt anatomy images. Explained muscles attachments/ connection, physiology of deep core system/ spinal- thoracic-pelvis-lower kinetic chain as they relate to pt's presentation, Sx, and past Hx. Explained what and how these areas of deficits need to be restored to balance and function    See Therapeutic activity / neuromuscular re-education section  Answered pt's questions.   Person educated: Patient Education method: Explanation, Demonstration, Tactile cues, Verbal cues, and Handouts Education comprehension: verbalized understanding, returned demonstration, verbal cues required, tactile cues required, and needs further education     PLAN: PT FREQUENCY: 1x/week   PT DURATION: 10 weeks   PLANNED INTERVENTIONS: Therapeutic exercises, Therapeutic activity, Neuromuscular re-education, Balance training, Gait training, Patient/Family education, Self Care, Joint mobilization, Spinal mobilization, Moist heat, Taping, and Manual therapy, dry needling.   PLAN FOR NEXT SESSION: See clinical impression for plan     GOALS: Goals reviewed with patient? Yes  SHORT TERM GOALS: Target date:08/22/2022    Pt will demo IND with HEP                    Baseline: Not IND            Goal status: INITIAL   LONG TERM GOALS: Target date: 10/03/2022    1.Pt will demo proper deep core coordination without chest breathing and optimal excursion of diaphragm/pelvic  floor in order to promote spinal stability and pelvic floor function  Baseline: dyscoordination Goal status: INITIAL  2.  Pt  will demo > 5 pt change on FOTO  to improve QOL and function   Lumber baseline  -   Goal status: INITIAL  3.  Pt will demo proper body mechanics in against gravity tasks and ADLs  work tasks, fitness  to minimize straining pelvic floor / back                  Baseline: not IND, improper form that places strain on pelvic floor                Goal status: INITIAL    4. Pt will demo increased hip strength > 45 bilaterally in order to minimize relapse of pain   Baseline:   hip R flexion 3/5, L 4/5,   B hip flexion 3/5, L hip abd 3/5, R 3 +/5, hip ext B 3/5,   coordination of multidifis, glut, hamstring: lacked glut activation secondary in sequence   Goal status: INITIAL    5. Pt will demo sit to stand without UE support on chair arm without cues across 2 visits in order to demo proper strength and body awareness to minimize fall risks  Baseline:  uses B UE on armrest  Goal status: INITIAL   6.  Baseline:  Goal status: INITIAL     Jerl Mina, PT 08/06/2022, 8:53 AM

## 2022-08-06 NOTE — Patient Instructions (Addendum)
Winging and brushing on both sides   _______________ Modified    pillow under belly,   Exhale :   L arm up  at a "V" thumbs up, shoulder down , chin tuck   Lengthen whole spine as if yard stick is balanced on spine, chin tucked    R leg lifts 15 deg up    L + R = 1 rep 10 reps    X 2   --30reps ( L/R =1 rep)   __   __  kitchen counter stretches  Hands on kitchen counter,   Palms shoulder width apart  Minisquat postion Trunk is parallel to floor  A) Pull buttocks back to lengthen spine, knees bent  3 breaths   B) Bring R hand to the L, and stretch the R side trunk  3 breaths   Brings hands to center again Do the same to the L side stretch by placing L hand on top of R   D) Modified thread the needle R hand on L thigh, L  thigh pushing out slightly as the R hands pull in,  elbow bent and pulls to theR,  Look under L armpit   Do the same to other side  ____

## 2022-08-08 ENCOUNTER — Encounter: Payer: Medicare Other | Admitting: Physical Therapy

## 2022-08-09 ENCOUNTER — Ambulatory Visit: Payer: Medicare Other | Admitting: Neurology

## 2022-08-13 ENCOUNTER — Ambulatory Visit (INDEPENDENT_AMBULATORY_CARE_PROVIDER_SITE_OTHER): Payer: Medicare Other | Admitting: Neurology

## 2022-08-13 DIAGNOSIS — G43009 Migraine without aura, not intractable, without status migrainosus: Secondary | ICD-10-CM

## 2022-08-13 DIAGNOSIS — G43709 Chronic migraine without aura, not intractable, without status migrainosus: Secondary | ICD-10-CM

## 2022-08-13 MED ORDER — NURTEC 75 MG PO TBDP: 75.0000 mg | ORAL_TABLET | Freq: Every day | ORAL | 11 refills | Status: DC | PRN

## 2022-08-13 MED ORDER — KETOROLAC TROMETHAMINE 60 MG/2ML IM SOLN: INTRAMUSCULAR | 4 refills | Status: AC

## 2022-08-13 MED ORDER — "BD SAFETYGLIDE SYRINGE/NEEDLE 25G X 1"" 3 ML MISC": 5 refills | Status: AC

## 2022-08-13 MED ORDER — ONABOTULINUMTOXINA 200 UNITS IJ SOLR
155.0000 [IU] | Freq: Once | INTRAMUSCULAR | Status: AC
  Administered 2022-08-13: 155 [IU] via INTRAMUSCULAR

## 2022-08-13 NOTE — Progress Notes (Signed)
Botox- 200 units x 1 vial Lot: [id number] Expiration: 12/2024 NDC: 4193-7902-40  Bacteriostatic 0.9% Sodium Chloride- 68m total Lot: [id number]: 11/25 NDC: 692426-834-19 Dx: GQ22.297B/B

## 2022-08-13 NOTE — Progress Notes (Signed)
Consent Form Botulism Toxin Injection For Chronic Migraine   08/13/2022: stable doing well. Now only having 4 migraine days a month and < 8 total headache days a month exceptional improvement > 50% in freq and severity will prescribe nurtec.   Meds ordered this encounter  Medications   botulinum toxin Type A (BOTOX) injection 155 Units    Botox- 200 units x 1 vial Lot: [id number] Expiration: 12/2024 NDC: 2094-7096-28  Bacteriostatic 0.9% Sodium Chloride- 83m total Lot: [id number]: 11/25 NDC: 665465-035-46 Dx: GF68.127B/B   ketorolac (TORADOL) 60 MG/2ML SOLN injection    Sig: Inject 1-214m(30-'60mg'$ ) intramuscularly at onset of migraine. May repeat in 6 hours. Max twice a day and 4 days per month.    Dispense:  10 mL    Refill:  4   SYRINGE-NEEDLE, DISP, 3 ML (BD SAFETYGLIDE SYRINGE/NEEDLE) 25G X 1" 3 ML MISC    Sig: Attach needle to syringe and use to draw up and administer Toradol. Do not reuse.    Dispense:  4 each    Refill:  5    05/17/2022: doing great, stable 02/21/2022: doing great, stable Interval history 11/22/2021: Still doing great, greater than 80% improvement in frequency of headaches and migraines.  Reviewed orally with patient, additionally signature is on file:  Botulism toxin has been approved by the Federal drug administration for treatment of chronic migraine. Botulism toxin does not cure chronic migraine and it may not be effective in some patients.  The administration of botulism toxin is accomplished by injecting a small amount of toxin into the muscles of the neck and head. Dosage must be titrated for each individual. Any benefits resulting from botulism toxin tend to wear off after 3 months with a repeat injection required if benefit is to be maintained. Injections are usually done every 3-4 months with maximum effect peak achieved by about 2 or 3 weeks. Botulism toxin is expensive and you should be sure of what costs you will incur resulting from the  injection.  The side effects of botulism toxin use for chronic migraine may include:   -Transient, and usually mild, facial weakness with facial injections  -Transient, and usually mild, head or neck weakness with head/neck injections  -Reduction or loss of forehead facial animation due to forehead muscle weakness  -Eyelid drooping  -Dry eye  -Pain at the site of injection or bruising at the site of injection  -Double vision  -Potential unknown long term risks  Contraindications: You should not have Botox if you are pregnant, nursing, allergic to albumin, have an infection, skin condition, or muscle weakness at the site of the injection, or have myasthenia gravis, Lambert-Eaton syndrome, or ALS.  It is also possible that as with any injection, there may be an allergic reaction or no effect from the medication. Reduced effectiveness after repeated injections is sometimes seen and rarely infection at the injection site may occur. All care will be taken to prevent these side effects. If therapy is given over a long time, atrophy and wasting in the muscle injected may occur. Occasionally the patient's become refractory to treatment because they develop antibodies to the toxin. In this event, therapy needs to be modified.  I have read the above information and consent to the administration of botulism toxin.    BOTOX PROCEDURE NOTE FOR MIGRAINE HEADACHE    Contraindications and precautions discussed with patient(above). Aseptic procedure was observed and patient tolerated procedure. Procedure performed by Dr. ToGeorgia DomThe condition has  existed for more than 6 months, and pt does not have a diagnosis of ALS, Myasthenia Gravis or Lambert-Eaton Syndrome.  Risks and benefits of injections discussed and pt agrees to proceed with the procedure.  Written consent obtained  These injections are medically necessary. Pt  receives good benefits from these injections. These injections do not cause  sedations or hallucinations which the oral therapies may cause.  Description of procedure:  The patient was placed in a sitting position. The standard protocol was used for Botox as follows, with 5 units of Botox injected at each site:   -Procerus muscle, midline injection  -Corrugator muscle, bilateral injection  -Frontalis muscle, bilateral injection, with 2 sites each side, medial injection was performed in the upper one third of the frontalis muscle, in the region vertical from the medial inferior edge of the superior orbital rim. The lateral injection was again in the upper one third of the forehead vertically above the lateral limbus of the cornea, 1.5 cm lateral to the medial injection site.  -Temporalis muscle injection, 4 sites, bilaterally. The first injection was 3 cm above the tragus of the ear, second injection site was 1.5 cm to 3 cm up from the first injection site in line with the tragus of the ear. The third injection site was 1.5-3 cm forward between the first 2 injection sites. The fourth injection site was 1.5 cm posterior to the second injection site.   -Occipitalis muscle injection, 3 sites, bilaterally. The first injection was done one half way between the occipital protuberance and the tip of the mastoid process behind the ear. The second injection site was done lateral and superior to the first, 1 fingerbreadth from the first injection. The third injection site was 1 fingerbreadth superiorly and medially from the first injection site.  -Cervical paraspinal muscle injection, 2 sites, bilateral knee first injection site was 1 cm from the midline of the cervical spine, 3 cm inferior to the lower border of the occipital protuberance. The second injection site was 1.5 cm superiorly and laterally to the first injection site.  -Trapezius muscle injection was performed at 3 sites, bilaterally. The first injection site was in the upper trapezius muscle halfway between the  inflection point of the neck, and the acromion. The second injection site was one half way between the acromion and the first injection site. The third injection was done between the first injection site and the inflection point of the neck.   Will return for repeat injection in 3 months.   155 units of Botox was used, 45U Botox not injected was wasted. The patient tolerated the procedure well, there were no complications of the above procedure.

## 2022-08-14 ENCOUNTER — Ambulatory Visit: Payer: Medicare Other | Attending: Specialist | Admitting: Physical Therapy

## 2022-08-14 ENCOUNTER — Telehealth: Payer: Self-pay | Admitting: *Deleted

## 2022-08-14 DIAGNOSIS — M542 Cervicalgia: Secondary | ICD-10-CM | POA: Diagnosis present

## 2022-08-14 DIAGNOSIS — M25562 Pain in left knee: Secondary | ICD-10-CM | POA: Diagnosis present

## 2022-08-14 DIAGNOSIS — M2170 Unequal limb length (acquired), unspecified site: Secondary | ICD-10-CM | POA: Insufficient documentation

## 2022-08-14 DIAGNOSIS — R278 Other lack of coordination: Secondary | ICD-10-CM | POA: Diagnosis present

## 2022-08-14 DIAGNOSIS — G8929 Other chronic pain: Secondary | ICD-10-CM | POA: Diagnosis present

## 2022-08-14 DIAGNOSIS — M545 Low back pain, unspecified: Secondary | ICD-10-CM | POA: Diagnosis present

## 2022-08-14 DIAGNOSIS — M533 Sacrococcygeal disorders, not elsewhere classified: Secondary | ICD-10-CM | POA: Diagnosis present

## 2022-08-14 DIAGNOSIS — M25561 Pain in right knee: Secondary | ICD-10-CM | POA: Insufficient documentation

## 2022-08-14 NOTE — Telephone Encounter (Signed)
Need new start Nurtec PA completed asap.   KEY: [id number].

## 2022-08-14 NOTE — Patient Instructions (Addendum)
cued for stairs at 45 deg body turn to rail and eccentric control on lowering heel to minimize knee pain  ________________________________________________________________________________ Multifidis twist   Band is on doorknob: sit facing perpendicular to door , sit halfway towards front of chair, firm through 4 points of contact at buttocks and feet. Feet are placed hip with apart.   Twisting trunk without moving the hips and knees Hold band at the level of ribcage, elbows bent,shoulder blades roll back and down like squeezing a pencil under armpit   Exhale twist,.10-15 deg away from door without moving your hips/ knees, press more weight on the side of the sitting bones/ foot opp of your direction of turn as your counterweight. Continue to maintain equal weight through legs.  Keep knee unlocked.  30 reps  ___________________________________________________________________________________    Duanne Guess WITH RESISTANCE BLUE Band at waist connected to doorknob 81mns Stepping forward normal length steps, planting mid and forefoot down, center of mass ( navel) leans forward slightly as if you were walking uphill 3-4 steps till band feels taut ( MAKE SURE THE DOOR IS LOCKED AND WON'T OPEN)   Stepping backwards, lower heel slowly, carry trunk and hips back , leaning forward, front knee along 2-3 rd toe line    2 min  ____  Strengthening feet arches:    Heel raises - heels together, minisquat  Minisquat motion, trunk bent , gaze onto floor like you are looking at your reflection over a lake/pond,  Knees bent pointed out like a "v" , navel ( center of mass) more forward  Heels together as you lift, pointed out like a "v"  KNEES ARE ALIGNED BEHIND THE TOES TO MOcean Viewyour  navel ( center of mass) more forward to a avoid dropping down fast and rocking more weight back onto heels , keep heels pressing against each other the whole time   10 reps  ____________

## 2022-08-14 NOTE — Therapy (Signed)
OUTPATIENT PHYSICAL THERAPY  Treatment   Patient Name: Brianna Calhoun MRN: [id number] DOB:[birth year], 67 y.o., female Today's Date: 08/14/2022   PT End of Session - 08/14/22 1509     Visit Number 4    Number of Visits 10    Date for PT Re-Evaluation 10/03/22   eval 6/28   PT Start Time 9678    PT Stop Time 1550    PT Time Calculation (min) 46 min    Activity Tolerance Patient tolerated treatment well;No increased pain    Behavior During Therapy [facility] for tasks assessed/performed             Past Medical History:  Diagnosis Date   Actinic keratosis    Basal cell carcinoma 04/13/2008   Right temple   Basal cell carcinoma 10/07/2019   Right chest   Basal cell carcinoma (BCC) of face 03/08/2009   Right temple, recurrent   Basal cell carcinoma (BCC) of face 11/28/2015   Right temple, hairline. Nodular pattern   Basal cell carcinoma of trunk 03/29/2014   Right paraspinal upper back. Superficial    Cancer ([place])    skin   GERD (gastroesophageal reflux disease)    Hyperlipidemia    Migraine    Squamous cell carcinoma of skin 05/24/2021   Left chest [facility]   Past Surgical History:  Procedure Laterality Date   CERVICAL CERCLAGE     6/84, 1/90, 8/93 under epidurals   [facility], [place]   COLONOSCOPY N/A 12/13/2014   Procedure: COLONOSCOPY;  Surgeon: Manya Silvas, MD;  Location: [place];  Service: Endoscopy;  Laterality: N/A;   EYE SURGERY  1998   HERNIA REPAIR  1996   left hernia repair   LEFT OOPHORECTOMY Left 2005   multiple complex cysts   llq incisional hernia repair & mini-tummy tuck  07/1994   nasal surgeries     9/87, 9/00, 11/04; deviated septum, ethmoidectomy, rhinoplasty, rhinoplasty revision   Patient Active Problem List   Diagnosis Date Noted   Osteopenia after menopause 10/04/2021   Urge incontinence 10/21/2020   Elevated blood pressure reading in office with white coat syndrome, without diagnosis of  hypertension 10/21/2020   Arthralgia of both hands 10/21/2020   Urinary frequency 10/21/2020   Perineal irritation in female 01/17/2020   Hyperplastic polyp of large intestine 01/15/2020   Diverticulosis of colon 01/15/2020   Internal hemorrhoid, bleeding 01/15/2020   Chronic migraine without aura without status migrainosus, not intractable 09/21/2019   History of migraine headaches 07/30/2019   Educated about COVID-19 virus infection 01/16/2019   Statin intolerance 08/08/2017   Encounter for preventive health examination 06/03/2017   Hyperlipidemia LDL goal <130 10/16/2016   Anxiety state 10/16/2016   Allergy to iodinated contrast media 10/16/2016    PCP: Derrel Nip   REFERRING PROVIDER: Marcelino Freestone DIAG:  M17.0 (ICD-10-CM) - Bilateral primary osteoarthritis of knee  M54.30 (ICD-10-CM) - Sciatica, unspecified side    Rationale for Evaluation and Treatment Rehabilitation  THERAPY DIAG:  Chronic right-sided low back pain without sciatica  Leg length discrepancy  Sacrococcygeal disorders, not elsewhere classified  Neck pain  Chronic pain of left knee  Chronic pain of right knee  Other lack of coordination  ONSET DATE:  end of March 2023  SUBJECTIVE:  SUBJECTIVE STATEMENT TODAY:  Pt sleeps through the night now. LBP occurs only intermittently and it is not sharp, only dull and eases quickly. R back of calf.  Knees hurt with stairs   SUBJECTIVE STATEMENT on EVAL 07/25/22  1) B knee pain:  Pt was playing pickleball and she came down on her R knee and something catch in her SI joint and it radiated down her back of her knee.  Then her knee started hurting. Xrays and MRI showed inflammation, nothing was torn. Currently there is pain behind knee cap with up and down stairs.  R is worst  than L. 7/10. Pain eases in an inconsistent day.   2) LBP:  started in June ( 6 months ago) after knees started hurt. This started 3 days after RSV vaccine. She could move but she had to tell her brain to pick legs up.  Pt got COVID in Aug-Sept. Pt noticed Covid related Sx : breathing in a cloud. Pt took a Z pack and it cleared up. Pt thinks she had lng-Covid for two weeks. Pt noticed there is no pattern to when back pain occurs. It does occur in the monring but there are days when she does not have it. Pt has gone skiing and she had no LBP nor kneepain the next day. She feels the knee pain and LBP are interwined and activities help relief the pain at both areas. Pt stopped wearing her shoe lift for past 6 months.  Achiness occurred with sleeping from the back of her back to the back of her knee but it  has not happened again in the last several weeks.   3) neck pain   PERTINENT HISTORY:  Pt will be going on a cruise Feb 15-25 .  Pt completed PT Sept 2022 and felt no LBP , vaginal pressure ,  urinary urgency , nor hemorrhoids since completing PT. Pt goes to the gym and modifies her routine and avoids crunches. Pt has picked back up doing her deep core exercises.   PAIN:  Are you having pain? See above   PRECAUTIONS: None  WEIGHT BEARING RESTRICTIONS: No  FALLS:  Has patient fallen in last 6 months? 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  PLOF: Independent  PATIENT GOALS:  Get rid of pain and be ready for pickleball season and cruise    OBJECTIVE:    [facility] PT Assessment - 08/14/22 1522       Coordination   Coordination and Movement Description poor disassocation between trunk and pelvis /legs      Ambulation/Gait   Gait Comments ( post Tx: no knee pain)             [facility] Adult PT Treatment/Exercise - 08/14/22 1522        Therapeutic Activites    Other Therapeutic Activities cued for stairs at 45 deg body turn to rail and eccentric control on lowering heel to minimize knee pain , provided sandal recommendations      Neuro Re-ed    Neuro Re-ed Details  cued for standing multidifis twist with feet propioception , green band ,  Cued for feet , knee, alignment with backward and forward lunge walking with resistance band at waist   Excessive cues for less toe gripping               HOME  EXERCISE PROGRAM: See pt instruction section    ASSESSMENT:  CLINICAL IMPRESSION:                  Focused today on lower kinetic chain which showed decreased R DF/EV, tight plantar fascia/ IT band, and tib-fib joint hypomobility.    Added more thoracolumbar and glut strengthening to improved RLE strengthening with cues for feet, knee , hip alignment and more eccentric control of heels on stairs.    Pt demo'd improved DF/EV with stairs without knee pain post Tx. Provided recommendations to sandals instead of Vionic thongs which causes more toe gripping tendencies. Pt showed less toe gripping for balance post training.   Plan to consolidate HEP before pt goes on her cruise.     Pt benefits from skilled PT.    OBJECTIVE IMPAIRMENTS decreased activity tolerance, decreased coordination, decreased endurance, decreased mobility, difficulty walking, decreased ROM, decreased strength, decreased safety awareness, hypomobility, increased muscle spasms, impaired flexibility, improper body mechanics, postural dysfunction, and pain.    ACTIVITY LIMITATIONS  self-care, physical activities, home chores, work tasks    PARTICIPATION LIMITATIONS:  community, gym activities    PERSONAL FACTORS   leg length difference is  affecting patient's functional outcome.    REHAB POTENTIAL: Good   CLINICAL DECISION MAKING: Evolving/moderate complexity   EVALUATION COMPLEXITY: Moderate    PATIENT EDUCATION:    Education details:  Showed pt anatomy images. Explained muscles attachments/ connection, physiology of deep core system/ spinal- thoracic-pelvis-lower kinetic chain as they relate to pt's presentation, Sx, and past Hx. Explained what and how these areas of deficits need to be restored to balance and function    See Therapeutic activity / neuromuscular re-education section  Answered pt's questions.   Person educated: Patient Education method: Explanation, Demonstration, Tactile cues, Verbal cues, and Handouts Education comprehension: verbalized understanding, returned demonstration, verbal cues required, tactile cues required, and needs further education     PLAN: PT FREQUENCY: 1x/week   PT DURATION: 10 weeks   PLANNED INTERVENTIONS: Therapeutic exercises, Therapeutic activity, Neuromuscular re-education, Balance training, Gait training, Patient/Family education, Self Care, Joint mobilization, Spinal mobilization, Moist heat, Taping, and Manual therapy, dry needling.   PLAN FOR NEXT SESSION: See clinical impression for plan     GOALS: Goals reviewed with patient? Yes  SHORT TERM GOALS: Target date:08/22/2022    Pt will demo IND with HEP                    Baseline: Not IND            Goal status: INITIAL   LONG TERM GOALS: Target date: 10/03/2022    1.Pt will demo proper deep core coordination without chest breathing and optimal excursion of diaphragm/pelvic floor in order to promote spinal stability and pelvic floor function  Baseline: dyscoordination Goal status: INITIAL  2.  Pt will demo > 5 pt change on FOTO  to improve QOL and function   Lumber baseline  -   Goal status: INITIAL  3.  Pt will demo proper body mechanics in against gravity tasks and ADLs  work tasks, fitness  to minimize straining pelvic floor / back                  Baseline: not IND, improper form that places strain on pelvic floor                Goal status: INITIAL    4. Pt will demo increased hip strength >  45  bilaterally in order to minimize relapse of pain   Baseline:   hip R flexion 3/5, L 4/5,   B hip flexion 3/5, L hip abd 3/5, R 3 +/5, hip ext B 3/5,   coordination of multidifis, glut, hamstring: lacked glut activation secondary in sequence   Goal status: INITIAL    5. Pt will demo sit to stand without UE support on chair arm without cues across 2 visits in order to demo proper strength and body awareness to minimize fall risks  Baseline:  uses B UE on armrest  Goal status: INITIAL   6. Pt will demo no knee pain and proper alignment, improved DF/EV,  and eccentric control with stairs  Baseline: knee pain on R loading leg ( lacking DF/EV) on ascension, poor eccentric control on descent  Goal status: INITIAL     Jerl Mina, PT 08/14/2022, 3:10 PM

## 2022-08-15 ENCOUNTER — Other Ambulatory Visit (HOSPITAL_COMMUNITY): Payer: Self-pay

## 2022-08-15 NOTE — Telephone Encounter (Signed)
Pharmacy Patient Advocate Encounter   Received notification from [place] that prior authorization for Nurtec '75MG'$  dispersible tablets is required/requested.    PA submitted on 08/15/2022 to (ins) [employer] via CoverMyMeds Key [id number] Status is pending

## 2022-08-16 NOTE — Telephone Encounter (Signed)
Patient Advocate Encounter  Prior Authorization for Nurtec '75MG'$  dispersible tablets has been approved.    PA# [id number] Effective dates: 08/01/2022 through until further notice      Lyndel Safe, [place] Patient [place] Patient Advocate Team Direct Number: [phone]  Fax: [phone]

## 2022-08-20 ENCOUNTER — Ambulatory Visit: Payer: Medicare Other | Admitting: Physical Therapy

## 2022-08-20 DIAGNOSIS — G8929 Other chronic pain: Secondary | ICD-10-CM

## 2022-08-20 DIAGNOSIS — M2170 Unequal limb length (acquired), unspecified site: Secondary | ICD-10-CM

## 2022-08-20 DIAGNOSIS — M542 Cervicalgia: Secondary | ICD-10-CM

## 2022-08-20 DIAGNOSIS — M545 Low back pain, unspecified: Secondary | ICD-10-CM | POA: Diagnosis not present

## 2022-08-20 DIAGNOSIS — M533 Sacrococcygeal disorders, not elsewhere classified: Secondary | ICD-10-CM

## 2022-08-20 NOTE — Therapy (Signed)
OUTPATIENT PHYSICAL THERAPY  Treatment   Patient Name: Brianna Calhoun MRN: [id number] DOB:12-12-1955, 67 y.o., female Today's Date: 08/14/2022   PT End of Session - 08/20/22 0856     Visit Number 5    Number of Visits 10    Date for PT Re-Evaluation 10/03/22   eval 6/28   PT Start Time 0850    PT Stop Time 0935    PT Time Calculation (min) 45 min    Activity Tolerance Patient tolerated treatment well;No increased pain    Behavior During Therapy [facility] for tasks assessed/performed               Past Medical History:  Diagnosis Date   Actinic keratosis    Basal cell carcinoma 04/13/2008   Right temple   Basal cell carcinoma 10/07/2019   Right chest   Basal cell carcinoma (BCC) of face 03/08/2009   Right temple, recurrent   Basal cell carcinoma (BCC) of face 11/28/2015   Right temple, hairline. Nodular pattern   Basal cell carcinoma of trunk 03/29/2014   Right paraspinal upper back. Superficial    Cancer ([place])    skin   GERD (gastroesophageal reflux disease)    Hyperlipidemia    Migraine    Squamous cell carcinoma of skin 05/24/2021   Left chest [facility]   Past Surgical History:  Procedure Laterality Date   CERVICAL CERCLAGE     6/84, 1/90, 8/93 under epidurals   [place], [place]   COLONOSCOPY N/A 12/13/2014   Procedure: COLONOSCOPY;  Surgeon: Manya Silvas, MD;  Location: [place];  Service: Endoscopy;  Laterality: N/A;   EYE SURGERY  1998   HERNIA REPAIR  1996   left hernia repair   LEFT OOPHORECTOMY Left 2005   multiple complex cysts   llq incisional hernia repair & mini-tummy tuck  07/1994   nasal surgeries     9/87, 9/00, 11/04; deviated septum, ethmoidectomy, rhinoplasty, rhinoplasty revision   Patient Active Problem List   Diagnosis Date Noted   Osteopenia after menopause 10/04/2021   Urge incontinence 10/21/2020   Elevated blood pressure reading in office with white coat syndrome, without diagnosis  of hypertension 10/21/2020   Arthralgia of both hands 10/21/2020   Urinary frequency 10/21/2020   Perineal irritation in female 01/17/2020   Hyperplastic polyp of large intestine 01/15/2020   Diverticulosis of colon 01/15/2020   Internal hemorrhoid, bleeding 01/15/2020   Chronic migraine without aura without status migrainosus, not intractable 09/21/2019   History of migraine headaches 07/30/2019   Educated about COVID-19 virus infection 01/16/2019   Statin intolerance 08/08/2017   Encounter for preventive health examination 06/03/2017   Hyperlipidemia LDL goal <130 10/16/2016   Anxiety state 10/16/2016   Allergy to iodinated contrast media 10/16/2016    PCP: Derrel Nip   REFERRING PROVIDER: Marcelino Freestone DIAG:  M17.0 (ICD-10-CM) - Bilateral primary osteoarthritis of knee  M54.30 (ICD-10-CM) - Sciatica, unspecified side    Rationale for Evaluation and Treatment Rehabilitation  THERAPY DIAG:  Chronic right-sided low back pain without sciatica  Leg length discrepancy  Sacrococcygeal disorders, not elsewhere classified  Neck pain  Chronic pain of left knee  Chronic pain of right knee  Other lack of coordination  ONSET DATE:  end of March 2023  SUBJECTIVE:  SUBJECTIVE STATEMENT TODAY:  Pt sleeps through the night now. LBP occurs only intermittently and it is not sharp, only dull and eases quickly. R back of calf.  Knees hurt with stairs   SUBJECTIVE STATEMENT on EVAL 07/25/22  1) B knee pain:  Pt was playing pickleball and she came down on her R knee and something catch in her SI joint and it radiated down her back of her knee.  Then her knee started hurting. Xrays and MRI showed inflammation, nothing was torn. Currently there is pain behind knee cap with up and down stairs.  R is worst  than L. 7/10. Pain eases in an inconsistent day.   2) LBP:  started in June ( 6 months ago) after knees started hurt. This started 3 days after RSV vaccine. She could move but she had to tell her brain to pick legs up.  Pt got COVID in Aug-Sept. Pt noticed Covid related Sx : breathing in a cloud. Pt took a Z pack and it cleared up. Pt thinks she had lng-Covid for two weeks. Pt noticed there is no pattern to when back pain occurs. It does occur in the monring but there are days when she does not have it. Pt has gone skiing and she had no LBP nor kneepain the next day. She feels the knee pain and LBP are interwined and activities help relief the pain at both areas. Pt stopped wearing her shoe lift for past 6 months.  Achiness occurred with sleeping from the back of her back to the back of her knee but it  has not happened again in the last several weeks.   3) neck pain   PERTINENT HISTORY:  Pt will be going on a cruise Feb 15-25 .  Pt completed PT Sept 2022 and felt no LBP , vaginal pressure ,  urinary urgency , nor hemorrhoids since completing PT. Pt goes to the gym and modifies her routine and avoids crunches. Pt has picked back up doing her deep core exercises.   PAIN:  Are you having pain? See above   PRECAUTIONS: None  WEIGHT BEARING RESTRICTIONS: No  FALLS:  Has patient fallen in last 6 months? 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 lateral leg mm R > L, hypomobile midfoot joints, limited toe abduction B              [facility] Adult PT Treatment/Exercise - 08/20/22 1348       Therapeutic Activites    Other Therapeutic Activities educated ot on how to insert shoe lifts in heel and toe box of tennis shoes, recommended not to wear flip flops she brought to show PT      Manual Therapy   Manual therapy comments STM/MWM at problem areas noted in assessment to improve DF/EV on R, knee ER              HOME EXERCISE PROGRAM: See pt instruction section    ASSESSMENT:  CLINICAL IMPRESSION:  Continued to focused today on lower kinetic chain which showed decreased R DF/EV, tight plantar fascia/ IT band, and tib-fib joint hypomobility.   Discussed HEP that pt will do while pt goes on her cruise.    Educated pt on how to insert shoe lifts in heel and toe box of tennis shoes, recommended not to wear flip flops she brought to show PT.  Plan to continue addressing  lower kinetic chain, and slight thoracic shift to R at upcoming sessions to minimize spinal derivations and decrease risk for LBP relapse.     Pt benefits from skilled PT.    OBJECTIVE IMPAIRMENTS decreased activity tolerance, decreased coordination, decreased endurance, decreased mobility, difficulty walking, decreased ROM, decreased strength, decreased safety awareness, hypomobility, increased muscle spasms, impaired flexibility, improper body mechanics, postural dysfunction, and pain.    ACTIVITY LIMITATIONS  self-care, physical activities, home chores, work tasks    PARTICIPATION LIMITATIONS:  community, gym activities    PERSONAL FACTORS   leg length difference is  affecting patient's functional outcome.    REHAB POTENTIAL: Good   CLINICAL DECISION MAKING: Evolving/moderate complexity   EVALUATION COMPLEXITY: Moderate    PATIENT EDUCATION:    Education details: Showed pt anatomy images. Explained muscles attachments/ connection, physiology of deep core system/ spinal- thoracic-pelvis-lower kinetic chain as they  relate to pt's presentation, Sx, and past Hx. Explained what and how these areas of deficits need to be restored to balance and function    See Therapeutic activity / neuromuscular re-education section  Answered pt's questions.   Person educated: Patient Education method: Explanation, Demonstration, Tactile cues, Verbal cues, and Handouts Education comprehension: verbalized understanding, returned demonstration, verbal cues required, tactile cues required, and needs further education     PLAN: PT FREQUENCY: 1x/week   PT DURATION: 10 weeks   PLANNED INTERVENTIONS: Therapeutic exercises, Therapeutic activity, Neuromuscular re-education, Balance training, Gait training, Patient/Family education, Self Care, Joint mobilization, Spinal mobilization, Moist heat, Taping, and Manual therapy, dry needling.   PLAN FOR NEXT SESSION: See clinical impression for plan     GOALS: Goals reviewed with patient? Yes  SHORT TERM GOALS: Target date:08/22/2022    Pt will demo IND with HEP                    Baseline: Not IND            Goal status: INITIAL   LONG TERM GOALS: Target date: 10/03/2022    1.Pt will demo proper deep core coordination without chest breathing and optimal excursion of diaphragm/pelvic floor in order to promote spinal stability and pelvic floor function  Baseline: dyscoordination Goal status: INITIAL  2.  Pt will demo > 5 pt change on FOTO  to improve QOL and function   Lumber baseline  -   Goal status: INITIAL  3.  Pt will demo proper body mechanics in against gravity tasks and ADLs  work tasks, fitness  to minimize straining pelvic floor / back                  Baseline: not IND, improper form that places strain on pelvic floor                Goal status: INITIAL    4. Pt will demo increased hip strength > 45 bilaterally in order to minimize relapse of pain   Baseline:   hip R flexion 3/5, L 4/5,   B hip flexion 3/5, L hip abd 3/5, R 3 +/5, hip  ext B  3/5,   coordination of multidifis, glut, hamstring: lacked glut activation secondary in sequence   Goal status: INITIAL    5. Pt will demo sit to stand without UE support on chair arm without cues across 2 visits in order to demo proper strength and body awareness to minimize fall risks  Baseline:  uses B UE on armrest  Goal status: INITIAL   6. Pt will demo no knee pain and proper alignment, improved DF/EV,  and eccentric control with stairs  Baseline: knee pain on R loading leg ( lacking DF/EV) on ascension, poor eccentric control on descent  Goal status: INITIAL     Jerl Mina, PT 08/14/2022, 3:10 PM

## 2022-08-20 NOTE — Patient Instructions (Signed)
  MORNING, EVENING if you can go midday, that is good too!   Lengthen Back rib by L  shoulder ( winging)    Lie on R  side , pillow between knees and under head  Pull  L arm overhead over mattress, grab the edge of mattress,pull it upward, drawing elbow away from ears  Breathing 10 reps  Brushing arm with 3/4 turn onto pillow behind back  Lying on R  side ,Pillow/ Block between knees     dragging top forearm across ribs below breast rotating 3/4 turn,  rotating  _L_ only this week ,  relax onto the pillow behind the back  and then back to other palm , maintain top palm on body whole top and not lift shoulder  ___  Before doing windshield wipers;  Side of hip stretch:  both sides   Reclined twist for hips and side of the hips/ legs  Lay on your back, knees bend Scoot hips to the R , leave shoulders in place Wobble knees to the L side 45 deg and to midline  10 reps    For 5 min  resting onto pillows to keep leg at the same width of hips Pillow under L thigh to minimize too much strain '

## 2022-08-31 ENCOUNTER — Telehealth: Payer: Self-pay | Admitting: Internal Medicine

## 2022-08-31 NOTE — Telephone Encounter (Signed)
Called patient to schedule Medicare Annual Wellness Visit (AWV). Left message for patient to call back and schedule Medicare Annual Wellness Visit (AWV).  Last date of AWV: 12/22/2019   Please schedule an appointment at any time with Denisa,NHA.  If any questions, please contact me at [phone].    Thank you,  [facility] Direct dial  [phone]

## 2022-09-03 ENCOUNTER — Ambulatory Visit: Payer: Medicare Other | Admitting: Physical Therapy

## 2022-09-03 DIAGNOSIS — M545 Low back pain, unspecified: Secondary | ICD-10-CM

## 2022-09-03 DIAGNOSIS — M542 Cervicalgia: Secondary | ICD-10-CM

## 2022-09-03 DIAGNOSIS — M2170 Unequal limb length (acquired), unspecified site: Secondary | ICD-10-CM

## 2022-09-03 DIAGNOSIS — G8929 Other chronic pain: Secondary | ICD-10-CM

## 2022-09-03 DIAGNOSIS — M533 Sacrococcygeal disorders, not elsewhere classified: Secondary | ICD-10-CM

## 2022-09-03 NOTE — Therapy (Unsigned)
OUTPATIENT PHYSICAL THERAPY  Treatment   Patient Name: Brianna Calhoun MRN: [id number] DOB:1955/08/10, 67 y.o., female Today's Date: 09/03/2022   PT End of Session - 09/03/22 0858     Visit Number 6    Number of Visits 10    Date for PT Re-Evaluation 10/03/22   eval 6/28   PT Start Time 0852    PT Stop Time 0930    PT Time Calculation (min) 38 min    Activity Tolerance Patient tolerated treatment well;No increased pain    Behavior During Therapy [facility] for tasks assessed/performed               Past Medical History:  Diagnosis Date   Actinic keratosis    Basal cell carcinoma 04/13/2008   Right temple   Basal cell carcinoma 10/07/2019   Right chest   Basal cell carcinoma (BCC) of face 03/08/2009   Right temple, recurrent   Basal cell carcinoma (BCC) of face 11/28/2015   Right temple, hairline. Nodular pattern   Basal cell carcinoma of trunk 03/29/2014   Right paraspinal upper back. Superficial    Cancer ([place])    skin   GERD (gastroesophageal reflux disease)    Hyperlipidemia    Migraine    Squamous cell carcinoma of skin 05/24/2021   Left chest [facility]   Past Surgical History:  Procedure Laterality Date   CERVICAL CERCLAGE     6/84, 1/90, 8/93 under epidurals   [place], [place]   COLONOSCOPY N/A 12/13/2014   Procedure: COLONOSCOPY;  Surgeon: Manya Silvas, MD;  Location: [place];  Service: Endoscopy;  Laterality: N/A;   EYE SURGERY  1998   HERNIA REPAIR  1996   left hernia repair   LEFT OOPHORECTOMY Left 2005   multiple complex cysts   llq incisional hernia repair & mini-tummy tuck  07/1994   nasal surgeries     9/87, 9/00, 11/04; deviated septum, ethmoidectomy, rhinoplasty, rhinoplasty revision   Patient Active Problem List   Diagnosis Date Noted   Osteopenia after menopause 10/04/2021   Urge incontinence 10/21/2020   Elevated blood pressure reading in office with white coat syndrome, without diagnosis  of hypertension 10/21/2020   Arthralgia of both hands 10/21/2020   Urinary frequency 10/21/2020   Perineal irritation in female 01/17/2020   Hyperplastic polyp of large intestine 01/15/2020   Diverticulosis of colon 01/15/2020   Internal hemorrhoid, bleeding 01/15/2020   Chronic migraine without aura without status migrainosus, not intractable 09/21/2019   History of migraine headaches 07/30/2019   Educated about COVID-19 virus infection 01/16/2019   Statin intolerance 08/08/2017   Encounter for preventive health examination 06/03/2017   Hyperlipidemia LDL goal <130 10/16/2016   Anxiety state 10/16/2016   Allergy to iodinated contrast media 10/16/2016    PCP: Derrel Nip   REFERRING PROVIDER: Marcelino Freestone DIAG:  M17.0 (ICD-10-CM) - Bilateral primary osteoarthritis of knee  M54.30 (ICD-10-CM) - Sciatica, unspecified side    Rationale for Evaluation and Treatment Rehabilitation  THERAPY DIAG:  Chronic right-sided low back pain without sciatica  Sacrococcygeal disorders, not elsewhere classified  Leg length discrepancy  Neck pain  Chronic pain of right knee  Chronic pain of left knee  ONSET DATE:  end of March 2023  SUBJECTIVE:  SUBJECTIVE STATEMENT TODAY:  Pt had no LBP on her cruise until the last few days after increasing steppage from 10K to 14 K and playing pickleball lon the 3-4 days. Pt noticed knee pain BLE behind knee cap during the last 3-4 days after pickle ball and the B knee pain occurred with stair climbing. Pt noticed tightness along R thoracic and low back today.    SUBJECTIVE STATEMENT on EVAL 07/25/22  1) B knee pain:  Pt was playing pickleball and she came down on her R knee and something catch in her SI joint and it radiated down her back of her knee.  Then her knee  started hurting. Xrays and MRI showed inflammation, nothing was torn. Currently there is pain behind knee cap with up and down stairs.  R is worst than L. 7/10. Pain eases in an inconsistent day.   2) LBP:  started in June ( 6 months ago) after knees started hurt. This started 3 days after RSV vaccine. She could move but she had to tell her brain to pick legs up.  Pt got COVID in Aug-Sept. Pt noticed Covid related Sx : breathing in a cloud. Pt took a Z pack and it cleared up. Pt thinks she had lng-Covid for two weeks. Pt noticed there is no pattern to when back pain occurs. It does occur in the monring but there are days when she does not have it. Pt has gone skiing and she had no LBP nor kneepain the next day. She feels the knee pain and LBP are interwined and activities help relief the pain at both areas. Pt stopped wearing her shoe lift for past 6 months.  Achiness occurred with sleeping from the back of her back to the back of her knee but it  has not happened again in the last several weeks.   3) neck pain   PERTINENT HISTORY:  Pt will be going on a cruise Feb 15-25 .  Pt completed PT Sept 2022 and felt no LBP , vaginal pressure ,  urinary urgency , nor hemorrhoids since completing PT. Pt goes to the gym and modifies her routine and avoids crunches. Pt has picked back up doing her deep core exercises.   PAIN:  Are you having pain? See above   PRECAUTIONS: None  WEIGHT BEARING RESTRICTIONS: No  FALLS:  Has patient fallen in last 6 months? 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     OBJECTIVE IMPAIRMENTS decreased activity tolerance, decreased coordination, decreased endurance, decreased mobility, difficulty walking, decreased ROM, decreased strength, decreased safety awareness, hypomobility, increased muscle spasms, impaired flexibility, improper body mechanics, postural dysfunction, and pain.    ACTIVITY LIMITATIONS  self-care, physical activities, home chores, work tasks    PARTICIPATION LIMITATIONS:  community, gym activities    PERSONAL FACTORS   leg length difference is  affecting patient's functional outcome.    REHAB POTENTIAL: Good   CLINICAL DECISION MAKING: Evolving/moderate complexity   EVALUATION COMPLEXITY: Moderate  PATIENT EDUCATION:    Education details: Showed pt anatomy images. Explained muscles attachments/ connection, physiology of deep core system/ spinal- thoracic-pelvis-lower kinetic chain as they relate to pt's presentation, Sx, and past Hx. Explained what and how these areas of deficits need to be restored to balance and function    See Therapeutic activity / neuromuscular re-education section  Answered pt's questions.   Person educated: Patient Education method: Explanation, Demonstration, Tactile cues, Verbal cues, and Handouts Education comprehension: verbalized understanding, returned demonstration, verbal cues required, tactile cues required, and needs further education     PLAN: PT FREQUENCY: 1x/week   PT DURATION: 10 weeks   PLANNED INTERVENTIONS: Therapeutic exercises, Therapeutic activity, Neuromuscular re-education, Balance training, Gait training, Patient/Family education, Self Care, Joint mobilization, Spinal mobilization, Moist heat, Taping, and Manual therapy, dry needling.   PLAN FOR NEXT SESSION: See clinical impression for plan     GOALS: Goals reviewed with patient? Yes  SHORT TERM GOALS: Target date:08/22/2022    Pt will demo IND with HEP                    Baseline: Not IND            Goal  status: INITIAL   LONG TERM GOALS: Target date: 10/03/2022    1.Pt will demo proper deep core coordination without chest breathing and optimal excursion of diaphragm/pelvic floor in order to promote spinal stability and pelvic floor function  Baseline: dyscoordination Goal status: INITIAL  2.  Pt will demo > 5 pt change on FOTO  to improve QOL and function   Lumber baseline  -   Goal status: INITIAL  3.  Pt will demo proper body mechanics in against gravity tasks and ADLs  work tasks, fitness  to minimize straining pelvic floor / back                  Baseline: not IND, improper form that places strain on pelvic floor                Goal status: INITIAL    4. Pt will demo increased hip strength > 45 bilaterally in order to minimize relapse of pain   Baseline:   hip R flexion 3/5, L 4/5,   B hip flexion 3/5, L hip abd 3/5, R 3 +/5, hip ext B 3/5,   coordination of multidifis, glut, hamstring: lacked glut activation secondary in sequence   Goal status: INITIAL    5. Pt will demo sit to stand without UE support on chair arm without cues across 2 visits in order to demo proper strength and body awareness to minimize fall risks  Baseline:  uses B UE on armrest  Goal status: INITIAL   6. Pt will demo no knee pain and proper alignment, improved DF/EV,  and eccentric control with stairs  Baseline: knee pain on R loading leg ( lacking DF/EV) on ascension, poor eccentric control on descent  Goal status: INITIAL     Jerl Mina, PT 09/03/2022, 8:58 AM

## 2022-09-03 NOTE — Patient Instructions (Signed)
   Sidelying: Brushing - midback    On your back:  Neck 6 directions with towel roll  Angel wings   Deep core level 1-2 '   Onbelly:  Swimmers    Standing:  Multidifis    Minisquat heels together

## 2022-09-07 ENCOUNTER — Telehealth: Payer: Self-pay | Admitting: *Deleted

## 2022-09-07 NOTE — Telephone Encounter (Signed)
Received fax PA approval for [facility] ID [id number] PA# [id number].  Approval 08-13-2022 until further notice.  Quantity 10/ 30 days (ok for 90 day supply except on specialty tier 5.  [phone].  Approval faxed to [employer].  With fax confirmation received.

## 2022-09-10 ENCOUNTER — Ambulatory Visit: Payer: Medicare Other | Attending: Specialist | Admitting: Physical Therapy

## 2022-09-10 DIAGNOSIS — M2170 Unequal limb length (acquired), unspecified site: Secondary | ICD-10-CM | POA: Insufficient documentation

## 2022-09-10 DIAGNOSIS — M533 Sacrococcygeal disorders, not elsewhere classified: Secondary | ICD-10-CM | POA: Diagnosis present

## 2022-09-10 DIAGNOSIS — M542 Cervicalgia: Secondary | ICD-10-CM | POA: Diagnosis present

## 2022-09-10 DIAGNOSIS — M25562 Pain in left knee: Secondary | ICD-10-CM | POA: Diagnosis present

## 2022-09-10 DIAGNOSIS — G8929 Other chronic pain: Secondary | ICD-10-CM | POA: Insufficient documentation

## 2022-09-10 DIAGNOSIS — M25561 Pain in right knee: Secondary | ICD-10-CM | POA: Diagnosis present

## 2022-09-10 DIAGNOSIS — M545 Low back pain, unspecified: Secondary | ICD-10-CM | POA: Diagnosis present

## 2022-09-10 DIAGNOSIS — R278 Other lack of coordination: Secondary | ICD-10-CM | POA: Diagnosis present

## 2022-09-10 NOTE — Therapy (Signed)
OUTPATIENT PHYSICAL THERAPY  Treatment   Patient Name: Brianna Calhoun MRN: [id number] DOB:1955/07/15, 67 y.o., female Today's Date: 09/10/2022   PT End of Session - 09/10/22 0900     Visit Number 7    Number of Visits 10    Date for PT Re-Evaluation 10/03/22   eval 6/28   PT Start Time [id number]    PT Stop Time 0936    PT Time Calculation (min) 39 min    Activity Tolerance Patient tolerated treatment well;No increased pain    Behavior During Therapy [facility] for tasks assessed/performed               Past Medical History:  Diagnosis Date   Actinic keratosis    Basal cell carcinoma 04/13/2008   Right temple   Basal cell carcinoma 10/07/2019   Right chest   Basal cell carcinoma (BCC) of face 03/08/2009   Right temple, recurrent   Basal cell carcinoma (BCC) of face 11/28/2015   Right temple, hairline. Nodular pattern   Basal cell carcinoma of trunk 03/29/2014   Right paraspinal upper back. Superficial    Cancer ([place])    skin   GERD (gastroesophageal reflux disease)    Hyperlipidemia    Migraine    Squamous cell carcinoma of skin 05/24/2021   Left chest [facility]   Past Surgical History:  Procedure Laterality Date   CERVICAL CERCLAGE     6/84, 1/90, 8/93 under epidurals   [place], [place]   COLONOSCOPY N/A 12/13/2014   Procedure: COLONOSCOPY;  Surgeon: Manya Silvas, MD;  Location: [place];  Service: Endoscopy;  Laterality: N/A;   EYE SURGERY  1998   HERNIA REPAIR  1996   left hernia repair   LEFT OOPHORECTOMY Left 2005   multiple complex cysts   llq incisional hernia repair & mini-tummy tuck  07/1994   nasal surgeries     9/87, 9/00, 11/04; deviated septum, ethmoidectomy, rhinoplasty, rhinoplasty revision   Patient Active Problem List   Diagnosis Date Noted   Osteopenia after menopause 10/04/2021   Urge incontinence 10/21/2020   Elevated blood pressure reading in office with white coat syndrome, without diagnosis  of hypertension 10/21/2020   Arthralgia of both hands 10/21/2020   Urinary frequency 10/21/2020   Perineal irritation in female 01/17/2020   Hyperplastic polyp of large intestine 01/15/2020   Diverticulosis of colon 01/15/2020   Internal hemorrhoid, bleeding 01/15/2020   Chronic migraine without aura without status migrainosus, not intractable 09/21/2019   History of migraine headaches 07/30/2019   Educated about COVID-19 virus infection 01/16/2019   Statin intolerance 08/08/2017   Encounter for preventive health examination 06/03/2017   Hyperlipidemia LDL goal <130 10/16/2016   Anxiety state 10/16/2016   Allergy to iodinated contrast media 10/16/2016    PCP: Derrel Nip   REFERRING PROVIDER: Marcelino Freestone DIAG:  M17.0 (ICD-10-CM) - Bilateral primary osteoarthritis of knee  M54.30 (ICD-10-CM) - Sciatica, unspecified side    Rationale for Evaluation and Treatment Rehabilitation  THERAPY DIAG:  Chronic right-sided low back pain without sciatica  Sacrococcygeal disorders, not elsewhere classified  Leg length discrepancy  Neck pain  Chronic pain of right knee  Chronic pain of left knee  Other lack of coordination  ONSET DATE:  end of March 2023  SUBJECTIVE:  SUBJECTIVE STATEMENT TODAY:  Pt had soreness at R lower back. Not acute pain like it was in the past, like it is not in position.   Pain behind knee cap on both knees    SUBJECTIVE STATEMENT on EVAL 07/25/22  1) B knee pain:  Pt was playing pickleball and she came down on her R knee and something catch in her SI joint and it radiated down her back of her knee.  Then her knee started hurting. Xrays and MRI showed inflammation, nothing was torn. Currently there is pain behind knee cap with up and down stairs.  R is worst than L. 7/10.  Pain eases in an inconsistent day.   2) LBP:  started in June ( 6 months ago) after knees started hurt. This started 3 days after RSV vaccine. She could move but she had to tell her brain to pick legs up.  Pt got COVID in Aug-Sept. Pt noticed Covid related Sx : breathing in a cloud. Pt took a Z pack and it cleared up. Pt thinks she had lng-Covid for two weeks. Pt noticed there is no pattern to when back pain occurs. It does occur in the monring but there are days when she does not have it. Pt has gone skiing and she had no LBP nor kneepain the next day. She feels the knee pain and LBP are interwined and activities help relief the pain at both areas. Pt stopped wearing her shoe lift for past 6 months.  Achiness occurred with sleeping from the back of her back to the back of her knee but it  has not happened again in the last several weeks.   3) neck pain   PERTINENT HISTORY:  Pt will be going on a cruise Feb 15-25 .  Pt completed PT Sept 2022 and felt no LBP , vaginal pressure ,  urinary urgency , nor hemorrhoids since completing PT. Pt goes to the gym and modifies her routine and avoids crunches. Pt has picked back up doing her deep core exercises.   PAIN:  Are you having pain? See above   PRECAUTIONS: None  WEIGHT BEARING RESTRICTIONS: No  FALLS:  Has patient fallen in last 6 months? 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  PLOF: Independent  PATIENT GOALS:  Get rid of pain and be ready for pickleball season and cruise    OBJECTIVE:   [facility] PT Assessment - 09/10/22 0903       Strength   Overall Strength Comments L knee flexion 4-/5, R 4+/5      Palpation   Palpation comment tightness along paraspinals , thoracic convex curve T12/L2             [facility] Adult PT Treatment/Exercise - 09/10/22 1156       Neuro Re-ed    Neuro  Re-ed Details  cued for proper tehcnique in standing backward stepping with opp scapulothoracic , minimize lordosis ,  strenghten gluts      Modalities   Modalities Moist Heat      Moist Heat Therapy   Number Minutes Moist Heat 5 Minutes    Moist Heat Location --   Thoracic     Manual Therapy   Internal Pelvic Floor STM/MWM at thoracic segments and paraspinals               HOME EXERCISE  PROGRAM: See pt instruction section    ASSESSMENT:  CLINICAL IMPRESSION:   Pt showed L thoracic posterior rotation and deviation segments along thoracic spine which was addressed with manual Tx. Discussed holding grandbaby with equal weight bearing on BLE to minimzie deivations to thoracic spine and relapse of R lumbar pain. Required excessive cues with backward stepping and posterior strengthening chain. Plan to review again at next session  Plan to address knee pain with stairs at next session.      Pt benefits from skilled PT.    OBJECTIVE IMPAIRMENTS decreased activity tolerance, decreased coordination, decreased endurance, decreased mobility, difficulty walking, decreased ROM, decreased strength, decreased safety awareness, hypomobility, increased muscle spasms, impaired flexibility, improper body mechanics, postural dysfunction, and pain.    ACTIVITY LIMITATIONS  self-care, physical activities, home chores, work tasks    PARTICIPATION LIMITATIONS:  community, gym activities    PERSONAL FACTORS   leg length difference is  affecting patient's functional outcome.    REHAB POTENTIAL: Good   CLINICAL DECISION MAKING: Evolving/moderate complexity   EVALUATION COMPLEXITY: Moderate    PATIENT EDUCATION:    Education details: Showed pt anatomy images. Explained muscles attachments/ connection, physiology of deep core system/ spinal- thoracic-pelvis-lower kinetic chain as they relate to pt's presentation, Sx, and past Hx. Explained what and how these areas of deficits need to be  restored to balance and function    See Therapeutic activity / neuromuscular re-education section  Answered pt's questions.   Person educated: Patient Education method: Explanation, Demonstration, Tactile cues, Verbal cues, and Handouts Education comprehension: verbalized understanding, returned demonstration, verbal cues required, tactile cues required, and needs further education     PLAN: PT FREQUENCY: 1x/week   PT DURATION: 10 weeks   PLANNED INTERVENTIONS: Therapeutic exercises, Therapeutic activity, Neuromuscular re-education, Balance training, Gait training, Patient/Family education, Self Care, Joint mobilization, Spinal mobilization, Moist heat, Taping, and Manual therapy, dry needling.   PLAN FOR NEXT SESSION: See clinical impression for plan     GOALS: Goals reviewed with patient? Yes  SHORT TERM GOALS: Target date:08/22/2022    Pt will demo IND with HEP                    Baseline: Not IND            Goal status: INITIAL   LONG TERM GOALS: Target date: 10/03/2022    1.Pt will demo proper deep core coordination without chest breathing and optimal excursion of diaphragm/pelvic floor in order to promote spinal stability and pelvic floor function  Baseline: dyscoordination Goal status: INITIAL  2.  Pt will demo > 5 pt change on FOTO  to improve QOL and function   Lumber baseline  -   Goal status: INITIAL  3.  Pt will demo proper body mechanics in against gravity tasks and ADLs  work tasks, fitness  to minimize straining pelvic floor / back                  Baseline: not IND, improper form that places strain on pelvic floor                Goal status: INITIAL    4. Pt will demo increased hip strength > 45 bilaterally in order to minimize relapse of pain   Baseline:   hip R flexion 3/5, L 4/5,   B hip flexion 3/5, L hip abd 3/5, R 3 +/5, hip ext B 3/5,   coordination of multidifis, glut, hamstring: lacked glut  activation secondary in sequence   Goal  status: INITIAL    5. Pt will demo sit to stand without UE support on chair arm without cues across 2 visits in order to demo proper strength and body awareness to minimize fall risks  Baseline:  uses B UE on armrest  Goal status: INITIAL   6. Pt will demo no knee pain and proper alignment, improved DF/EV,  and eccentric control with stairs  Baseline: knee pain on R loading leg ( lacking DF/EV) on ascension, poor eccentric control on descent  Goal status: INITIAL     Jerl Mina, PT 09/10/2022, 9:01 AM

## 2022-09-17 ENCOUNTER — Ambulatory Visit: Payer: Medicare Other | Admitting: Physical Therapy

## 2022-09-17 DIAGNOSIS — R278 Other lack of coordination: Secondary | ICD-10-CM

## 2022-09-17 DIAGNOSIS — M545 Low back pain, unspecified: Secondary | ICD-10-CM

## 2022-09-17 DIAGNOSIS — M542 Cervicalgia: Secondary | ICD-10-CM

## 2022-09-17 DIAGNOSIS — M533 Sacrococcygeal disorders, not elsewhere classified: Secondary | ICD-10-CM

## 2022-09-17 DIAGNOSIS — M2170 Unequal limb length (acquired), unspecified site: Secondary | ICD-10-CM

## 2022-09-17 DIAGNOSIS — G8929 Other chronic pain: Secondary | ICD-10-CM

## 2022-09-17 NOTE — Therapy (Unsigned)
OUTPATIENT PHYSICAL THERAPY  Treatment   Patient Name: JERMANY WAN MRN: [id number] DOB:03-Feb-1956, 67 y.o., female Today's Date: 09/17/2022   PT End of Session - 09/17/22 0905     Visit Number 8    Number of Visits 10    Date for PT Re-Evaluation 10/03/22   eval 6/28   PT Start Time 0855    PT Stop Time 0940    PT Time Calculation (min) 45 min    Activity Tolerance Patient tolerated treatment well;No increased pain    Behavior During Therapy [facility] for tasks assessed/performed               Past Medical History:  Diagnosis Date   Actinic keratosis    Basal cell carcinoma 04/13/2008   Right temple   Basal cell carcinoma 10/07/2019   Right chest   Basal cell carcinoma (BCC) of face 03/08/2009   Right temple, recurrent   Basal cell carcinoma (BCC) of face 11/28/2015   Right temple, hairline. Nodular pattern   Basal cell carcinoma of trunk 03/29/2014   Right paraspinal upper back. Superficial    Cancer ([place])    skin   GERD (gastroesophageal reflux disease)    Hyperlipidemia    Migraine    Squamous cell carcinoma of skin 05/24/2021   Left chest [facility]   Past Surgical History:  Procedure Laterality Date   CERVICAL CERCLAGE     6/84, 1/90, 8/93 under epidurals   [place]   COLONOSCOPY N/A 12/13/2014   Procedure: COLONOSCOPY;  Surgeon: Manya Silvas, MD;  Location: [place];  Service: Endoscopy;  Laterality: N/A;   EYE SURGERY  1998   HERNIA REPAIR  1996   left hernia repair   LEFT OOPHORECTOMY Left 2005   multiple complex cysts   llq incisional hernia repair & mini-tummy tuck  07/1994   nasal surgeries     9/87, 9/00, 11/04; deviated septum, ethmoidectomy, rhinoplasty, rhinoplasty revision   Patient Active Problem List   Diagnosis Date Noted   Osteopenia after menopause 10/04/2021   Urge incontinence 10/21/2020   Elevated blood pressure reading in office with white coat syndrome, without diagnosis  of hypertension 10/21/2020   Arthralgia of both hands 10/21/2020   Urinary frequency 10/21/2020   Perineal irritation in female 01/17/2020   Hyperplastic polyp of large intestine 01/15/2020   Diverticulosis of colon 01/15/2020   Internal hemorrhoid, bleeding 01/15/2020   Chronic migraine without aura without status migrainosus, not intractable 09/21/2019   History of migraine headaches 07/30/2019   Educated about COVID-19 virus infection 01/16/2019   Statin intolerance 08/08/2017   Encounter for preventive health examination 06/03/2017   Hyperlipidemia LDL goal <130 10/16/2016   Anxiety state 10/16/2016   Allergy to iodinated contrast media 10/16/2016    PCP: Derrel Nip   REFERRING PROVIDER: Marcelino Freestone DIAG:  M17.0 (ICD-10-CM) - Bilateral primary osteoarthritis of knee  M54.30 (ICD-10-CM) - Sciatica, unspecified side    Rationale for Evaluation and Treatment Rehabilitation  THERAPY DIAG:  Chronic right-sided low back pain without sciatica  Sacrococcygeal disorders, not elsewhere classified  Leg length discrepancy  Neck pain  Chronic pain of right knee  Chronic pain of left knee  Other lack of coordination  ONSET DATE:  end of March 2023  SUBJECTIVE:  SUBJECTIVE STATEMENT TODAY:  Pt had achy ness at R low back after playing 4 hours.  Pt has sprained her wrist from playing.  Pt tried a boot camp class at the [facility] and modifies her exercises.     SUBJECTIVE STATEMENT on EVAL 07/25/22  1) B knee pain:  Pt was playing pickleball and she came down on her R knee and something catch in her SI joint and it radiated down her back of her knee.  Then her knee started hurting. Xrays and MRI showed inflammation, nothing was torn. Currently there is pain behind knee cap with up and down  stairs.  R is worst than L. 7/10. Pain eases in an inconsistent day.   2) LBP:  started in June ( 6 months ago) after knees started hurt. This started 3 days after RSV vaccine. She could move but she had to tell her brain to pick legs up.  Pt got COVID in Aug-Sept. Pt noticed Covid related Sx : breathing in a cloud. Pt took a Z pack and it cleared up. Pt thinks she had lng-Covid for two weeks. Pt noticed there is no pattern to when back pain occurs. It does occur in the monring but there are days when she does not have it. Pt has gone skiing and she had no LBP nor kneepain the next day. She feels the knee pain and LBP are interwined and activities help relief the pain at both areas. Pt stopped wearing her shoe lift for past 6 months.  Achiness occurred with sleeping from the back of her back to the back of her knee but it  has not happened again in the last several weeks.   3) neck pain   PERTINENT HISTORY:  Pt will be going on a cruise Feb 15-25 .  Pt completed PT Sept 2022 and felt no LBP , vaginal pressure ,  urinary urgency , nor hemorrhoids since completing PT. Pt goes to the gym and modifies her routine and avoids crunches. Pt has picked back up doing her deep core exercises.   PAIN:  Are you having pain? See above   PRECAUTIONS: None  WEIGHT BEARING RESTRICTIONS: No  FALLS:  Has patient fallen in last 6 months? 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 Discussed holding grandbaby with equal weight  bearing on BLE to minimzie deivations to thoracic spine and relapse of R lumbar pain. Required excessive cues with backward stepping and posterior strengthening chain. Plan to review again at next session  Plan to address knee pain with stairs at next session.      Pt benefits from skilled PT.    OBJECTIVE IMPAIRMENTS decreased activity tolerance, decreased coordination, decreased endurance, decreased mobility, difficulty walking, decreased ROM, decreased strength, decreased safety awareness, hypomobility, increased muscle spasms, impaired flexibility, improper body mechanics, postural dysfunction, and pain.    ACTIVITY LIMITATIONS  self-care, physical activities, home chores, work tasks    PARTICIPATION LIMITATIONS:  community, gym activities    [place]  leg length difference is  affecting patient's functional outcome.    REHAB POTENTIAL: Good   CLINICAL DECISION MAKING: Evolving/moderate complexity   EVALUATION COMPLEXITY: Moderate    PATIENT EDUCATION:    Education details: Showed pt anatomy images. Explained muscles attachments/ connection, physiology of deep core system/ spinal- thoracic-pelvis-lower kinetic chain as they relate to pt's presentation, Sx, and past Hx. Explained what and how these areas of deficits need to be restored to balance and function    See Therapeutic activity / neuromuscular re-education section  Answered pt's questions.   Person educated: Patient Education method: Explanation, Demonstration, Tactile cues, Verbal cues, and Handouts Education comprehension: verbalized understanding, returned demonstration, verbal cues required, tactile cues required, and needs further education     PLAN: PT FREQUENCY: 1x/week   PT DURATION: 10 weeks   PLANNED INTERVENTIONS: Therapeutic exercises, Therapeutic activity, Neuromuscular re-education, Balance training, Gait training, Patient/Family education, Self Care, Joint mobilization, Spinal mobilization,  Moist heat, Taping, and Manual therapy, dry needling.   PLAN FOR NEXT SESSION: See clinical impression for plan     GOALS: Goals reviewed with patient? Yes  SHORT TERM GOALS: Target date:08/22/2022    Pt will demo IND with HEP                    Baseline: Not IND            Goal status: INITIAL   LONG TERM GOALS: Target date: 10/03/2022    1.Pt will demo proper deep core coordination without chest breathing and optimal excursion of diaphragm/pelvic floor in order to promote spinal stability and pelvic floor function  Baseline: dyscoordination Goal status: INITIAL  2.  Pt will demo > 5 pt change on FOTO  to improve QOL and function   Lumber baseline  -   Goal status: INITIAL  3.  Pt will demo proper body mechanics in against gravity tasks and ADLs  work tasks, fitness  to minimize straining pelvic floor / back                  Baseline: not IND, improper form that places strain on pelvic floor                Goal status: INITIAL    4. Pt will demo increased hip strength > 45 bilaterally in order to minimize relapse of pain   Baseline:   hip R flexion 3/5, L 4/5,   B hip flexion 3/5, L hip abd 3/5, R 3 +/5, hip ext B 3/5,   coordination of multidifis, glut, hamstring: lacked glut activation secondary in sequence   Goal status: INITIAL    5. Pt will demo sit to stand without UE support on chair arm without cues across 2 visits in order to demo proper strength and body awareness to minimize fall risks  Baseline:  uses B UE on armrest  Goal status: INITIAL   6. Pt will demo no knee pain and proper alignment, improved DF/EV,  and eccentric control with stairs  Baseline: knee pain on R loading leg ( lacking DF/EV) on ascension, poor eccentric control on descent  Goal status: INITIAL     Jerl Mina, PT 09/17/2022, 9:06 AM

## 2022-09-17 NOTE — Patient Instructions (Addendum)
  WITH RED BAND   Clam Shell 45 Degrees  Lying with hips and knees bent 45, one pillow between knees and ankles. Heel together, toes apart like ballerina,  Lift knee with exhale while pressing heels together. Be sure pelvis does not roll backward. Do not arch back. Do 20 times, L knee lifts  ( R sidelying) , 2 times per day.    Complimentary stretch:  Figure-4  with a strap, elbows and shoulder down   3 breaths    __  WITH RED  BAND  Deep corel leve 1-2  __  Forearm, wrists, and finger stretches:  1)  Elbows straight,thumbs down, cross wrists over Bring knuckles by your chest and end with them under chin  Reverse 5 reps  And switch to the other side   2)  Start with prayer palm at the chest , fingers spread Bring arms up overhead, elbows straight, then lower palms down past waist, release fingers, point them back so elbow crease opens to the front, bringing hands back behind your pockets, shoulders down and back,   5 reps    3)  Elbow crease to the sky, fingertips down, other hand gently pulls fingers back towards you  Switch hands  5 reps    4) Little mermaid fins:     5) Wrist -thumb   stand perpendicular to wall, stretching the palm and thumb up and fingers touching wall   -sitting, hands behind you, tenting the fingers, pulsing heel of hand down   __  Wall lean for scoliosis   Forearm slide against wall as you stand perpendicular to wall,feet hip width apart,  opposite hand pulls band without letting elbow move away from side body  And slide forearm against the door up and down  Tilt trunk away from door but not away from hip   Make sure the upper trapezius muscle does not hike up to ear as band is being pulled as you lean towards wall    band  10 x 2    Leaning L, R  forearm against door only to open the R  _ flank area

## 2022-09-24 ENCOUNTER — Ambulatory Visit: Payer: Medicare Other | Admitting: Physical Therapy

## 2022-09-26 ENCOUNTER — Ambulatory Visit (INDEPENDENT_AMBULATORY_CARE_PROVIDER_SITE_OTHER): Payer: Medicare Other | Admitting: Internal Medicine

## 2022-09-26 ENCOUNTER — Encounter: Payer: Self-pay | Admitting: Internal Medicine

## 2022-09-26 ENCOUNTER — Telehealth: Payer: Self-pay | Admitting: Internal Medicine

## 2022-09-26 VITALS — BP 116/71 | HR 70 | Temp 98.1°F | Ht 65.0 in | Wt 139.6 lb

## 2022-09-26 DIAGNOSIS — E785 Hyperlipidemia, unspecified: Secondary | ICD-10-CM

## 2022-09-26 DIAGNOSIS — R5383 Other fatigue: Secondary | ICD-10-CM

## 2022-09-26 DIAGNOSIS — R030 Elevated blood-pressure reading, without diagnosis of hypertension: Secondary | ICD-10-CM

## 2022-09-26 DIAGNOSIS — R7303 Prediabetes: Secondary | ICD-10-CM | POA: Diagnosis not present

## 2022-09-26 DIAGNOSIS — M1990 Unspecified osteoarthritis, unspecified site: Secondary | ICD-10-CM | POA: Diagnosis not present

## 2022-09-26 DIAGNOSIS — M8580 Other specified disorders of bone density and structure, unspecified site: Secondary | ICD-10-CM

## 2022-09-26 DIAGNOSIS — D582 Other hemoglobinopathies: Secondary | ICD-10-CM

## 2022-09-26 DIAGNOSIS — E559 Vitamin D deficiency, unspecified: Secondary | ICD-10-CM | POA: Diagnosis not present

## 2022-09-26 DIAGNOSIS — R790 Abnormal level of blood mineral: Secondary | ICD-10-CM

## 2022-09-26 DIAGNOSIS — D751 Secondary polycythemia: Secondary | ICD-10-CM

## 2022-09-26 DIAGNOSIS — Z1231 Encounter for screening mammogram for malignant neoplasm of breast: Secondary | ICD-10-CM

## 2022-09-26 LAB — CBC WITH DIFFERENTIAL/PLATELET
Basophils Absolute: 0 10*3/uL (ref 0.0–0.1)
Basophils Relative: 0.9 % (ref 0.0–3.0)
Eosinophils Absolute: 0.1 10*3/uL (ref 0.0–0.7)
Eosinophils Relative: 1.1 % (ref 0.0–5.0)
HCT: 46.4 % — ABNORMAL HIGH (ref 36.0–46.0)
Hemoglobin: 15.8 g/dL — ABNORMAL HIGH (ref 12.0–15.0)
Lymphocytes Relative: 39.5 % (ref 12.0–46.0)
Lymphs Abs: 2 10*3/uL (ref 0.7–4.0)
MCHC: 34 g/dL (ref 30.0–36.0)
MCV: 92.4 fl (ref 78.0–100.0)
Monocytes Absolute: 0.5 10*3/uL (ref 0.1–1.0)
Monocytes Relative: 10.1 % (ref 3.0–12.0)
Neutro Abs: 2.4 10*3/uL (ref 1.4–7.7)
Neutrophils Relative %: 48.4 % (ref 43.0–77.0)
Platelets: 278 10*3/uL (ref 150.0–400.0)
RBC: 5.03 Mil/uL (ref 3.87–5.11)
RDW: 12.9 % (ref 11.5–15.5)
WBC: 5 10*3/uL (ref 4.0–10.5)

## 2022-09-26 LAB — COMPREHENSIVE METABOLIC PANEL
ALT: 21 U/L (ref 0–35)
AST: 22 U/L (ref 0–37)
Albumin: 4.5 g/dL (ref 3.5–5.2)
Alkaline Phosphatase: 79 U/L (ref 39–117)
BUN: 18 mg/dL (ref 6–23)
CO2: 28 mEq/L (ref 19–32)
Calcium: 9.8 mg/dL (ref 8.4–10.5)
Chloride: 104 mEq/L (ref 96–112)
Creatinine, Ser: 0.76 mg/dL (ref 0.40–1.20)
GFR: 80.27 mL/min (ref >60.00)
Glucose, Bld: 96 mg/dL (ref 70–99)
Potassium: 4.9 mEq/L (ref 3.5–5.1)
Sodium: 139 mEq/L (ref 135–145)
Total Bilirubin: 0.7 mg/dL (ref 0.2–1.2)
Total Protein: 7 g/dL (ref 6.0–8.3)

## 2022-09-26 LAB — LIPID PANEL
Cholesterol: 236 mg/dL — ABNORMAL HIGH (ref 0–200)
HDL: 50.5 mg/dL (ref >39.00)
LDL Cholesterol: 161 mg/dL — ABNORMAL HIGH (ref 0–99)
NonHDL: 185.57
Total CHOL/HDL Ratio: 5
Triglycerides: 124 mg/dL (ref 0.0–149.0)
VLDL: 24.8 mg/dL (ref 0.0–40.0)

## 2022-09-26 LAB — C-REACTIVE PROTEIN: CRP: <1 mg/dL (ref 0.5–20.0)

## 2022-09-26 LAB — SEDIMENTATION RATE: Sed Rate: 1 mm/hr (ref 0–30)

## 2022-09-26 LAB — HEMOGLOBIN A1C: Hgb A1c MFr Bld: 5.6 % (ref 4.6–6.5)

## 2022-09-26 LAB — LDL CHOLESTEROL, DIRECT: Direct LDL: 164 mg/dL

## 2022-09-26 LAB — TSH: TSH: 4.55 u[IU]/mL (ref 0.35–5.50)

## 2022-09-26 LAB — MICROALBUMIN / CREATININE URINE RATIO
Creatinine,U: 100.5 mg/dL
Microalb Creat Ratio: 0.8 mg/g (ref 0.0–30.0)
Microalb, Ur: 0.8 mg/dL (ref 0.0–1.9)

## 2022-09-26 LAB — VITAMIN D 25 HYDROXY (VIT D DEFICIENCY, FRACTURES): VITD: 33.84 ng/mL (ref 30.00–100.00)

## 2022-09-26 MED ORDER — OSELTAMIVIR PHOSPHATE 75 MG PO CAPS: 75.0000 mg | ORAL_CAPSULE | Freq: Every day | ORAL | 0 refills | Status: AC

## 2022-09-26 MED ORDER — OSELTAMIVIR PHOSPHATE 75 MG PO CAPS: 75.0000 mg | ORAL_CAPSULE | Freq: Every day | ORAL | 0 refills | Status: DC

## 2022-09-26 MED ORDER — AZITHROMYCIN 250 MG PO TABS: ORAL_TABLET | ORAL | 0 refills | Status: AC

## 2022-09-26 MED ORDER — PREDNISONE 10 MG PO TABS: ORAL_TABLET | ORAL | 0 refills | Status: DC

## 2022-09-26 NOTE — Telephone Encounter (Signed)
Pharmacy called stating they need clarifications on directions for  TAMIFLU. The pharmacy states usually they see for the directions of this medication it is for ten tablets take one 2x a day or for ten tablets take one a day to prevent a person from getting the flu

## 2022-09-26 NOTE — Progress Notes (Unsigned)
Patient ID: Brianna Calhoun, female    DOB: Jun 07, 1956  Age: 67 y.o. MRN: [id number]  The patient is here for FOLLOW UP AND MANAGEMENT of other chronic and acute problems.   The risk factors are reflected in the social history.   The roster of all physicians providing medical care to patient - is listed in the Snapshot section of the chart.   Activities of daily living:  The patient is 100% independent in all ADLs: dressing, toileting, feeding as well as independent mobility   Home safety : The patient has smoke detectors in the home. They wear seatbelts.  There are no unsecured firearms at home. There is no violence in the home.    There is no risks for hepatitis, STDs or HIV. There is no   history of blood transfusion. They have no travel history to infectious disease endemic areas of the world.   The patient has seen their dentist in the last six month. They have seen their eye doctor in the last year. The patinet  denies slight hearing difficulty with regard to whispered voices and some television programs.  They have deferred audiologic testing in the last year.  They do not  have excessive sun exposure. Discussed the need for sun protection: hats, long sleeves and use of sunscreen if there is significant sun exposure.    Diet: the importance of a healthy diet is discussed. They do have a healthy diet.   The benefits of regular aerobic exercise were discussed. The patient  exercises  5 days per week  for  60 minutes.    Depression screen: there are no signs or vegative symptoms of depression- irritability, change in appetite, anhedonia, sadness/tearfullness.   The following portions of the patient's history were reviewed and updated as appropriate: allergies, current medications, past family history, past medical history,  past surgical history, past social history  and problem list.   Visual acuity was not assessed per patient preference since the patient has regular follow up with an   ophthalmologist. Hearing and body mass index were assessed and reviewed.    During the course of the visit the patient was educated and counseled about appropriate screening and preventive services including : fall prevention , diabetes screening, nutrition counseling, colorectal cancer screening, and recommended immunizations.    Chief Complaint:  None   Review of Symptoms  Patient denies headache, fevers, malaise, unintentional weight loss, skin rash, eye pain, sinus congestion and sinus pain, sore throat, dysphagia,  hemoptysis , cough, dyspnea, wheezing, chest pain, palpitations, orthopnea, edema, abdominal pain, nausea, melena, diarrhea, constipation, flank pain, dysuria, hematuria, urinary  Frequency, nocturia, numbness, tingling, seizures,  Focal weakness, Loss of consciousness,  Tremor, insomnia, depression, anxiety, and suicidal ideation.    Physical Exam:  BP 116/71   Pulse 70   Temp 98.1 F (36.7 C) (Oral)   Ht 5\' 5"  (1.651 m)   Wt 139 lb 9.6 oz (63.3 kg)   SpO2 97%   BMI 23.23 kg/m    Physical Exam Vitals reviewed.  Constitutional:      General: She is not in acute distress.    Appearance: Normal appearance. She is normal weight. She is not ill-appearing, toxic-appearing or diaphoretic.  HENT:     Head: Normocephalic.  Eyes:     General: No scleral icterus.       Right eye: No discharge.        Left eye: No discharge.     Conjunctiva/sclera: Conjunctivae normal.  Cardiovascular:     Rate and Rhythm: Normal rate and regular rhythm.     Heart sounds: Normal heart sounds.  Pulmonary:     Effort: Pulmonary effort is normal. No respiratory distress.     Breath sounds: Normal breath sounds.  Musculoskeletal:        General: Normal range of motion.  Skin:    General: Skin is warm and dry.  Neurological:     General: No focal deficit present.     Mental Status: She is alert and oriented to person, place, and time. Mental status is at baseline.  Psychiatric:         Mood and Affect: Mood normal.        Behavior: Behavior normal.        Thought Content: Thought content normal.        Judgment: Judgment normal.    Assessment and Plan: Encounter for screening mammogram for malignant neoplasm of breast -     3D Screening Mammogram, Left and Right; Future  Hyperlipidemia LDL goal <130 Assessment & Plan: 10 yr risk is 8.5%.  lipids are  Untreated due to statin intolerance (prior trial of Crestor in 2016 advised by Serafina Royals for 4% 10 yr risk)  And  Repatha has been denied by insurance .  No treatment needed as she has no other risk factors for CAD and no atherosclerosis was noted on 2018 CT abd and pelvis. . Lab Results  Component Value Date   CHOL 236 (H) 09/26/2022   HDL 50.50 09/26/2022   LDLCALC 161 (H) 09/26/2022   LDLDIRECT 164.0 09/26/2022   TRIG 124.0 09/26/2022   CHOLHDL 5 09/26/2022      Orders: -     Lipid panel -     LDL cholesterol, direct  Prediabetes -     Comprehensive metabolic panel -     Hemoglobin A1c -     Microalbumin / creatinine urine ratio  Other fatigue -     CBC with Differential/Platelet -     TSH  Arthritis -     Sedimentation rate -     C-reactive protein -     ANA  Vitamin D deficiency -     VITAMIN D 25 Hydroxy (Vit-D Deficiency, Fractures)  Elevated blood pressure reading in office with white coat syndrome, without diagnosis of hypertension Assessment & Plan: She has no history of hypertension but has had several elevated readings of iffice ONLY  HOME READINGS HAVE BEEN < 130/80 .  Lab Results  Component Value Date   CREATININE 0.76 09/26/2022   Lab Results  Component Value Date   NA 139 09/26/2022   K 4.9 09/26/2022   CL 104 09/26/2022   CO2 28 09/26/2022      Osteopenia after menopause Assessment & Plan: Will repeat DEXA this year at a 5 yr interval    Other orders -     predniSONE; 6 tablets on Day 1 , then reduce by 1 tablet daily until gone  Dispense: 21 tablet; Refill:  0 -     Azithromycin; Take 2 tablets on day 1, then 1 tablet daily on days 2 through 5  Dispense: 6 tablet; Refill: 0    No follow-ups on file.  Crecencio Mc, MD

## 2022-09-26 NOTE — Patient Instructions (Addendum)
Your Medicare Annual Wellness visit is due, please schedule this appointment at checkout.  TAmiflu,  prednisone taper and Zpack have been put on file at [facility]

## 2022-09-26 NOTE — Telephone Encounter (Signed)
Rx corrected and resent.  

## 2022-09-27 NOTE — Assessment & Plan Note (Signed)
10 yr risk is 8.5%.  lipids are  Untreated due to statin intolerance (prior trial of Crestor in 2016 advised by Serafina Royals for 4% 10 yr risk)  And  Repatha has been denied by insurance .  No treatment needed as she has no other risk factors for CAD and no atherosclerosis was noted on 2018 CT abd and pelvis. . Lab Results  Component Value Date   CHOL 236 (H) 09/26/2022   HDL 50.50 09/26/2022   LDLCALC 161 (H) 09/26/2022   LDLDIRECT 164.0 09/26/2022   TRIG 124.0 09/26/2022   CHOLHDL 5 09/26/2022

## 2022-09-27 NOTE — Assessment & Plan Note (Signed)
She has no history of hypertension but has had several elevated readings of iffice ONLY  HOME READINGS HAVE BEEN < 130/80 .  Lab Results  Component Value Date   CREATININE 0.76 09/26/2022   Lab Results  Component Value Date   NA 139 09/26/2022   K 4.9 09/26/2022   CL 104 09/26/2022   CO2 28 09/26/2022

## 2022-09-27 NOTE — Assessment & Plan Note (Signed)
Will repeat DEXA this year at a 5 yr interval  ?

## 2022-09-28 ENCOUNTER — Other Ambulatory Visit: Payer: Self-pay | Admitting: Internal Medicine

## 2022-09-28 LAB — ANTI-NUCLEAR AB-TITER (ANA TITER): ANA Titer 1: 1:40 {titer} — ABNORMAL HIGH

## 2022-09-28 LAB — ANA: Anti Nuclear Antibody (ANA): POSITIVE — AB

## 2022-10-01 ENCOUNTER — Ambulatory Visit: Payer: Medicare Other | Admitting: Physical Therapy

## 2022-10-01 NOTE — Addendum Note (Signed)
Addended by: Crecencio Mc on: 10/01/2022 12:59 PM   Modules accepted: Orders

## 2022-10-03 ENCOUNTER — Encounter: Payer: Self-pay | Admitting: Dermatology

## 2022-10-03 ENCOUNTER — Ambulatory Visit (INDEPENDENT_AMBULATORY_CARE_PROVIDER_SITE_OTHER): Payer: Medicare Other | Admitting: Dermatology

## 2022-10-03 ENCOUNTER — Encounter: Payer: Self-pay | Admitting: Internal Medicine

## 2022-10-03 VITALS — BP 134/77 | HR 71

## 2022-10-03 DIAGNOSIS — L820 Inflamed seborrheic keratosis: Secondary | ICD-10-CM

## 2022-10-03 DIAGNOSIS — L988 Other specified disorders of the skin and subcutaneous tissue: Secondary | ICD-10-CM

## 2022-10-03 DIAGNOSIS — B070 Plantar wart: Secondary | ICD-10-CM

## 2022-10-03 DIAGNOSIS — Z85828 Personal history of other malignant neoplasm of skin: Secondary | ICD-10-CM | POA: Diagnosis not present

## 2022-10-03 DIAGNOSIS — L814 Other melanin hyperpigmentation: Secondary | ICD-10-CM | POA: Diagnosis not present

## 2022-10-03 DIAGNOSIS — Z1283 Encounter for screening for malignant neoplasm of skin: Secondary | ICD-10-CM

## 2022-10-03 DIAGNOSIS — L578 Other skin changes due to chronic exposure to nonionizing radiation: Secondary | ICD-10-CM

## 2022-10-03 DIAGNOSIS — Z8589 Personal history of malignant neoplasm of other organs and systems: Secondary | ICD-10-CM

## 2022-10-03 DIAGNOSIS — L570 Actinic keratosis: Secondary | ICD-10-CM

## 2022-10-03 DIAGNOSIS — D229 Melanocytic nevi, unspecified: Secondary | ICD-10-CM

## 2022-10-03 DIAGNOSIS — D1801 Hemangioma of skin and subcutaneous tissue: Secondary | ICD-10-CM

## 2022-10-03 DIAGNOSIS — L821 Other seborrheic keratosis: Secondary | ICD-10-CM

## 2022-10-03 NOTE — Progress Notes (Signed)
Follow-Up Visit   Subjective  Brianna Calhoun is a 67 y.o. female who presents for the following: Skin Cancer Screening and Full Body Skin Exam 6 month tbse Wart lt foot , 2 places on back, spots at face use 5 f/u in November.   The patient presents for Total-Body Skin Exam (TBSE) for skin cancer screening and mole check. The patient has spots, moles and lesions to be evaluated, some may be new or changing and the patient has concerns that these could be cancer.  The following portions of the chart were reviewed this encounter and updated as appropriate: medications, allergies, medical history  Review of Systems:  No other skin or systemic complaints except as noted in HPI or Assessment and Plan.  Objective  Well appearing patient in no apparent distress; mood and affect are within normal limits.  A full examination was performed including scalp, head, eyes, ears, nose, lips, neck, chest, axillae, abdomen, back, buttocks, bilateral upper extremities, bilateral lower extremities, hands, feet, fingers, toes, fingernails, and toenails. All findings within normal limits unless otherwise noted below.   Relevant physical exam findings are noted in the Assessment and Plan.   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 Call for changes.  ACTINIC KERATOSIS Exam: Erythematous thin papules/macules with gritty scale  Actinic keratoses are precancerous spots that appear secondary to cumulative UV radiation exposure/sun exposure over time. They are chronic with expected duration over 1 year. A portion of actinic keratoses will progress to squamous cell carcinoma of the skin. It is not possible to reliably predict which spots will progress to skin cancer and so treatment is recommended to prevent development of skin cancer.  Recommend daily broad spectrum sunscreen SPF 30+ to sun-exposed areas, reapply  every 2 hours as needed.  Recommend staying in the shade or wearing long sleeves, sun glasses (UVA+UVB protection) and wide brim hats (4-inch brim around the entire circumference of the hat). Call for new or changing lesions.  Treatment Plan:  Prior to procedure, discussed risks of blister formation, small wound, skin dyspigmentation, or rare scar following cryotherapy. Recommend Vaseline ointment to treated areas while healing.  Destruction Procedure Note Destruction method: cryotherapy   Informed consent: discussed and consent obtained   Lesion destroyed using liquid nitrogen: Yes   Outcome: patient tolerated procedure well with no complications   Post-procedure details: wound care instructions given   Locations: right nose x 1 # of Lesions Treated: 1  INFLAMED SEBORRHEIC KERATOSIS Exam: Erythematous keratotic or waxy stuck-on papule or plaque. Symptomatic, irritating, patient would like treated. Benign-appearing.  Call clinic for new or changing lesions.  Prior to procedure, discussed risks of blister formation, small wound, skin dyspigmentation, or rare scar following treatment. Recommend Vaseline ointment to treated areas while healing.  Destruction Procedure Note Destruction method: cryotherapy   Informed consent: discussed and consent obtained   Lesion destroyed using liquid nitrogen: Yes   Outcome: patient tolerated procedure well with no complications   Post-procedure details: wound care instructions given   Locations: left cheek x 1, back x 15 # of Lesions Treated: 16  WART Exam: verrucous papule(s) Discussed viral / HPV (Human Papilloma Virus) etiology and risk of spread /infectivity to other areas of body as well as to other people.  Multiple treatments and methods may be required to clear warts and it is possible treatment may not be successful.  Treatment risks include  discoloration; scarring and there is still potential for wart recurrence.  Treatment  Plan: Destruction Procedure Note Destruction method: cryotherapy   Informed consent: discussed and consent obtained   Lesion destroyed using liquid nitrogen: Yes   Outcome: patient tolerated procedure well with no complications   Post-procedure details: wound care instructions given   Locations: left foot x 2  # of Lesions Treated: 2  Prior to procedure, discussed risks of blister formation, small wound, skin dyspigmentation, or rare scar following cryotherapy. Recommend Vaseline ointment to treated areas while healing.  Destruction Procedure Note Destruction method: chemical removal Informed consent: discussed and consent obtained   Chemical destruction method: Cantharone Plus Outcome: patient tolerated procedure well with no complications   Post-procedure details: Advised to wash off with soap and water in 4 hours or sooner if it becomes tender before then. Locations: left foot x 2 # of Lesions Treated: 2  Prior to procedure discussed that Cantharidin Plus is a blistering agent that comes from a beetle.  It needs to be washed off in about 4 hours after application.  Although it is painless when applied in office, it may cause symptoms of mild pain and burning several hours later.  Treated areas will swell and turn red, and blisters may form.  Vaseline and a bandaid may be applied until wound has healed.  Once healed, the skin may remain temporarily discolored.  It can take weeks to months for pigmentation to return to normal.    Squaric Acid 3% applied to warts today. Prior to application reviewed risk of inflammation and irritation.   LENTIGINES, SEBORRHEIC KERATOSES, HEMANGIOMAS - Benign normal skin lesions - Benign-appearing - Call for any changes  MELANOCYTIC NEVI - Tan-brown and/or pink-flesh-colored symmetric macules and papules - Benign appearing on exam today - Observation - Call clinic for new or changing moles - Recommend daily use of broad spectrum spf 30+ sunscreen to  sun-exposed areas.   FACIAL ELASTOSIS Exam: Rhytides and volume loss. Treatment Plan: Discussed DOA treatment for in the future 4 units each side  8 total units  Recommend daily broad spectrum sunscreen SPF 30+ to sun-exposed areas, reapply every 2 hours as needed. Call for new or changing lesions.  Staying in the shade or wearing long sleeves, sun glasses (UVA+UVB protection) and wide brim hats (4-inch brim around the entire circumference of the hat) are also recommended for sun protection.   ACTINIC DAMAGE - Chronic condition, secondary to cumulative UV/sun exposure - diffuse scaly erythematous macules with underlying dyspigmentation - Recommend daily broad spectrum sunscreen SPF 30+ to sun-exposed areas, reapply every 2 hours as needed.  - Staying in the shade or wearing long sleeves, sun glasses (UVA+UVB protection) and wide brim hats (4-inch brim around the entire circumference of the hat) are also recommended for sun protection.  - Call for new or changing lesions.  HISTORY OF BASAL CELL CARCINOMA OF THE SKIN Multiple locations see history - No evidence of recurrence today - Recommend regular full body skin exams - Recommend daily broad spectrum sunscreen SPF 30+ to sun-exposed areas, reapply every 2 hours as needed.  - Call if any new or changing lesions are noted between office visits  HISTORY OF SQUAMOUS CELL CARCINOMA OF THE SKIN Left chest 2022 - No evidence of recurrence today - No lymphadenopathy - Recommend regular full body skin exams - Recommend daily broad spectrum sunscreen SPF 30+ to sun-exposed areas, reapply every 2 hours as needed.  - Call if any new or changing lesions  are noted between office visits  SKIN CANCER SCREENING PERFORMED TODAY.  Return for 2- 3 month wart follow up, 6 month tbse .  Brianna Calhoun, CMA, am acting as scribe for Sarina Ser, MD.  Documentation: I have reviewed the above documentation for accuracy and completeness, and I  agree with the above.  Sarina Ser, MD

## 2022-10-03 NOTE — Patient Instructions (Addendum)
Discussed viral / HPV (Human Papilloma Virus) etiology and risk of spread /infectivity to other areas of body as well as to other people.  Multiple treatments and methods may be required to clear warts and it is possible treatment may not be successful.  Treatment risks include discoloration; scarring and there is still potential for wart recurrence.  Cantharidin Plus is a blistering agent that comes from a beetle.  It needs to be washed off in about 4 hours after application.  Although it is painless when applied in office, it may cause symptoms of mild pain and burning several hours later.  Treated areas will swell and turn red, and blisters may form.  Vaseline and a bandaid may be applied until wound has healed.  Once healed, the skin may remain temporarily discolored.  It can take weeks to months for pigmentation to return to normal.  Advised to wash off with soap and water in 4 hours or sooner if it becomes tender before then.  Squaric Acid 3% applied to warts today. Prior to application reviewed risk of inflammation and irritation.   Urea 20 - 40 % cream found online use nightly      Seborrheic Keratosis  What causes seborrheic keratoses? Seborrheic keratoses are harmless, common skin growths that first appear during adult life.  As time goes by, more growths appear.  Some people may develop a large number of them.  Seborrheic keratoses appear on both covered and uncovered body parts.  They are not caused by sunlight.  The tendency to develop seborrheic keratoses can be inherited.  They vary in color from skin-colored to gray, brown, or even black.  They can be either smooth or have a rough, warty surface.   Seborrheic keratoses are superficial and look as if they were stuck on the skin.  Under the microscope this type of keratosis looks like layers upon layers of skin.  That is why at times the top layer may seem to fall off, but the rest of the growth remains and re-grows.     Treatment Seborrheic keratoses do not need to be treated, but can easily be removed in the office.  Seborrheic keratoses often cause symptoms when they rub on clothing or jewelry.  Lesions can be in the way of shaving.  If they become inflamed, they can cause itching, soreness, or burning.  Removal of a seborrheic keratosis can be accomplished by freezing, burning, or surgery. If any spot bleeds, scabs, or grows rapidly, please return to have it checked, as these can be an indication of a skin cancer.  Cryotherapy Aftercare  Wash gently with soap and water everyday.   Apply Vaseline and Band-Aid daily until healed.   Actinic keratoses are precancerous spots that appear secondary to cumulative UV radiation exposure/sun exposure over time. They are chronic with expected duration over 1 year. A portion of actinic keratoses will progress to squamous cell carcinoma of the skin. It is not possible to reliably predict which spots will progress to skin cancer and so treatment is recommended to prevent development of skin cancer.  Recommend daily broad spectrum sunscreen SPF 30+ to sun-exposed areas, reapply every 2 hours as needed.  Recommend staying in the shade or wearing long sleeves, sun glasses (UVA+UVB protection) and wide brim hats (4-inch brim around the entire circumference of the hat). Call for new or changing lesions.   Melanoma ABCDEs  Melanoma is the most dangerous type of skin cancer, and is the leading cause of death from  skin disease.  You are more likely to develop melanoma if you: Have light-colored skin, light-colored eyes, or red or blond hair Spend a lot of time in the sun Tan regularly, either outdoors or in a tanning bed Have had blistering sunburns, especially during childhood Have a close family member who has had a melanoma Have atypical moles or large birthmarks  Early detection of melanoma is key since treatment is typically straightforward and cure rates are  extremely high if we catch it early.   The first sign of melanoma is often a change in a mole or a new dark spot.  The ABCDE system is a way of remembering the signs of melanoma.  A for asymmetry:  The two halves do not match. B for border:  The edges of the growth are irregular. C for color:  A mixture of colors are present instead of an even brown color. D for diameter:  Melanomas are usually (but not always) greater than 54mm - the size of a pencil eraser. E for evolution:  The spot keeps changing in size, shape, and color.  Please check your skin once per month between visits. You can use a small mirror in front and a large mirror behind you to keep an eye on the back side or your body.   If you see any new or changing lesions before your next follow-up, please call to schedule a visit.  Please continue daily skin protection including broad spectrum sunscreen SPF 30+ to sun-exposed areas, reapplying every 2 hours as needed when you're outdoors.   Staying in the shade or wearing long sleeves, sun glasses (UVA+UVB protection) and wide brim hats (4-inch brim around the entire circumference of the hat) are also recommended for sun protection.    Due to recent changes in healthcare laws, you may see results of your pathology and/or laboratory studies on MyChart before the doctors have had a chance to review them. We understand that in some cases there may be results that are confusing or concerning to you. Please understand that not all results are received at the same time and often the doctors may need to interpret multiple results in order to provide you with the best plan of care or course of treatment. Therefore, we ask that you please give [place] 2 business days to thoroughly review all your results before contacting the office for clarification. Should we see a critical lab result, you will be contacted sooner.   If You Need Anything After Your Visit  If you have any questions or concerns for  your doctor, please call our main line at [phone] and press option 4 to reach your doctor's medical assistant. If no one answers, please leave a voicemail as directed and we will return your call as soon as possible. Messages left after 4 pm will be answered the following business day.   You may also send [place] a message via [place]. We typically respond to MyChart messages within 1-2 business days.  For prescription refills, please ask your pharmacy to contact our office. Our fax number is [phone].  If you have an urgent issue when the clinic is closed that cannot wait until the next business day, you can page your doctor at the number below.    Please note that while we do our best to be available for urgent issues outside of office hours, we are not available 24/7.   If you have an urgent issue and are unable to reach [place], you  may choose to seek medical care at your doctor's office, retail clinic, urgent care center, or emergency room.  If you have a medical emergency, please immediately call 911 or go to the emergency department.  Pager Numbers  - Dr. Nehemiah Massed: [phone]  - Dr. Laurence Ferrari: [phone]  - Dr. Nicole Kindred: [phone]  In the event of inclement weather, please call our main line at [phone] for an update on the status of any delays or closures.  Dermatology Medication Tips: Please keep the boxes that topical medications come in in order to help keep track of the instructions about where and how to use these. Pharmacies typically print the medication instructions only on the boxes and not directly on the medication tubes.   If your medication is too expensive, please contact our office at [phone] option 4 or send [place] a message through [place].   We are unable to tell what your co-pay for medications will be in advance as this is different depending on your insurance coverage. However, we may be able to find a substitute medication at lower cost or fill out  paperwork to get insurance to cover a needed medication.   If a prior authorization is required to get your medication covered by your insurance company, please allow [place] 1-2 business days to complete this process.  Drug prices often vary depending on where the prescription is filled and some pharmacies may offer cheaper prices.  The website www.goodrx.com contains coupons for medications through different pharmacies. The prices here do not account for what the cost may be with help from insurance (it may be cheaper with your insurance), but the website can give you the price if you did not use any insurance.  - You can print the associated coupon and take it with your prescription to the pharmacy.  - You may also stop by our office during regular business hours and pick up a GoodRx coupon card.  - If you need your prescription sent electronically to a different pharmacy, notify our office through [facility] or by phone at [phone] option 4.     Si Usted Necesita Algo Despus de Su Visita  Tambin puede enviarnos un mensaje a travs de Pharmacist, community. Por lo general respondemos a los mensajes de MyChart en el transcurso de 1 a 2 das hbiles.  Para renovar recetas, por favor pida a su farmacia que se ponga en contacto con nuestra oficina. Harland Dingwall de fax es [place] [phone].  Si tiene un asunto urgente cuando la clnica est cerrada y que no puede esperar hasta el siguiente da hbil, puede llamar/localizar a su doctor(a) al nmero que aparece a continuacin.   Por favor, tenga en cuenta que aunque hacemos todo lo posible para estar disponibles para asuntos urgentes fuera del horario de [place], no estamos disponibles las 24 horas del da, los 7 das de la [place].   Si tiene un problema urgente y no puede comunicarse con nosotros, puede optar por buscar atencin mdica  en el consultorio de su doctor(a), en una clnica privada, en un centro de atencin urgente o en una sala de  emergencias.  Si tiene Engineering geologist, por favor llame inmediatamente al 911 o vaya a la sala de emergencias.  Nmeros de bper  - Dr. Nehemiah Massed: [phone]  - Dra. Moye: [phone]  - Dra. Nicole Kindred: [phone]  En caso de inclemencias del [place], por favor llame a Johnsie Kindred principal al [phone] para una actualizacin sobre el [place] de cualquier retraso o cierre.  Consejos para la medicacin en dermatologa: Por favor, guarde las cajas en las que vienen los medicamentos de uso tpico para ayudarle a seguir las instrucciones sobre dnde y cmo usarlos. Las farmacias generalmente imprimen las instrucciones del medicamento slo en las cajas y no directamente en los tubos del [place].   Si su medicamento es muy caro, por favor, pngase en contacto con Zigmund Daniel llamando al [phone] y presione la opcin 4 o envenos un mensaje a travs de Pharmacist, community.   No podemos decirle cul ser su copago por los medicamentos por adelantado ya que esto es diferente dependiendo de la cobertura de su seguro. Sin embargo, es posible que podamos encontrar un medicamento sustituto a Electrical engineer un formulario para que el seguro cubra el medicamento que se considera necesario.   Si se requiere una autorizacin previa para que su compaa de seguros [place] su medicamento, por favor permtanos de 1 a 2 das hbiles para completar este proceso.  Los precios de los medicamentos varan con frecuencia dependiendo del Environmental consultant de dnde se surte la receta y alguna farmacias pueden ofrecer precios ms baratos.  El sitio web www.goodrx.com tiene cupones para medicamentos de Airline pilot. Los precios aqu no tienen en cuenta lo que podra costar con la ayuda del seguro (puede ser ms barato con su seguro), pero el sitio web puede darle el precio si no utiliz Research scientist (physical sciences).  - Puede imprimir el cupn correspondiente y llevarlo con su receta a la farmacia.  - Tambin puede pasar por  nuestra oficina durante el horario de atencin regular y Charity fundraiser una tarjeta de cupones de GoodRx.  - Si necesita que su receta se enve electrnicamente a una farmacia diferente, informe a nuestra oficina a travs de MyChart de [place] o por telfono llamando al [phone] y presione la opcin 4.

## 2022-10-08 ENCOUNTER — Other Ambulatory Visit (INDEPENDENT_AMBULATORY_CARE_PROVIDER_SITE_OTHER): Payer: Medicare Other

## 2022-10-08 ENCOUNTER — Ambulatory Visit: Payer: Medicare Other | Admitting: Physical Therapy

## 2022-10-08 DIAGNOSIS — R790 Abnormal level of blood mineral: Secondary | ICD-10-CM | POA: Diagnosis not present

## 2022-10-08 DIAGNOSIS — D582 Other hemoglobinopathies: Secondary | ICD-10-CM

## 2022-10-09 LAB — TRANSFERRIN: Transferrin: 243 mg/dL (ref 212.0–360.0)

## 2022-10-09 LAB — IBC + FERRITIN
Ferritin: 181.1 ng/mL (ref 10.0–291.0)
Iron: 105 ug/dL (ref 42–145)
Saturation Ratios: 30.9 % (ref 20.0–50.0)
TIBC: 340.2 ug/dL (ref 250.0–450.0)
Transferrin: 243 mg/dL (ref 212.0–360.0)

## 2022-10-10 ENCOUNTER — Encounter: Payer: Medicare Other | Admitting: Internal Medicine

## 2022-10-10 DIAGNOSIS — D582 Other hemoglobinopathies: Secondary | ICD-10-CM | POA: Insufficient documentation

## 2022-10-10 NOTE — Assessment & Plan Note (Signed)
TSAT normal at 30% .  Will repeat cbc in one month

## 2022-10-10 NOTE — Addendum Note (Signed)
Addended by: Crecencio Mc on: 10/10/2022 06:05 AM   Modules accepted: Orders

## 2022-10-15 ENCOUNTER — Encounter: Payer: Medicare Other | Admitting: Physical Therapy

## 2022-10-17 ENCOUNTER — Ambulatory Visit (INDEPENDENT_AMBULATORY_CARE_PROVIDER_SITE_OTHER): Payer: Self-pay | Admitting: Dermatology

## 2022-10-17 ENCOUNTER — Encounter: Payer: Self-pay | Admitting: Dermatology

## 2022-10-17 ENCOUNTER — Ambulatory Visit: Payer: Medicare Other | Admitting: Internal Medicine

## 2022-10-17 VITALS — BP 131/78 | HR 74

## 2022-10-17 DIAGNOSIS — L988 Other specified disorders of the skin and subcutaneous tissue: Secondary | ICD-10-CM

## 2022-10-17 NOTE — Patient Instructions (Addendum)
Due to recent changes in healthcare laws, you may see results of your pathology and/or laboratory studies on MyChart before the doctors have had a chance to review them. We understand that in some cases there may be results that are confusing or concerning to you. Please understand that not all results are received at the same time and often the doctors may need to interpret multiple results in order to provide you with the best plan of care or course of treatment. Therefore, we ask that you please give us 2 business days to thoroughly review all your results before contacting the office for clarification. Should we see a critical lab result, you will be contacted sooner.   If You Need Anything After Your Visit  If you have any questions or concerns for your doctor, please call our main line at [phone] and press option 4 to reach your doctor's medical assistant. If no one answers, please leave a voicemail as directed and we will return your call as soon as possible. Messages left after 4 pm will be answered the following business day.   You may also send us a message via MyChart. We typically respond to MyChart messages within 1-2 business days.  For prescription refills, please ask your pharmacy to contact our office. Our fax number is [phone].  If you have an urgent issue when the clinic is closed that cannot wait until the next business day, you can page your doctor at the number below.    Please note that while we do our best to be available for urgent issues outside of office hours, we are not available 24/7.   If you have an urgent issue and are unable to reach us, you may choose to seek medical care at your doctor's office, retail clinic, urgent care center, or emergency room.  If you have a medical emergency, please immediately call 911 or go to the emergency department.  Pager Numbers  - Dr. Kowalski: [phone]  - Dr. Moye: [phone]  - Dr. Stewart:  [phone]  In the event of inclement weather, please call our main line at [phone] for an update on the status of any delays or closures.  Dermatology Medication Tips: Please keep the boxes that topical medications come in in order to help keep track of the instructions about where and how to use these. Pharmacies typically print the medication instructions only on the boxes and not directly on the medication tubes.   If your medication is too expensive, please contact our office at [phone] option 4 or send us a message through MyChart.   We are unable to tell what your co-pay for medications will be in advance as this is different depending on your insurance coverage. However, we may be able to find a substitute medication at lower cost or fill out paperwork to get insurance to cover a needed medication.   If a prior authorization is required to get your medication covered by your insurance company, please allow us 1-2 business days to complete this process.  Drug prices often vary depending on where the prescription is filled and some pharmacies may offer cheaper prices.  The website www.goodrx.com contains coupons for medications through different pharmacies. The prices here do not account for what the cost may be with help from insurance (it may be cheaper with your insurance), but the website can give you the price if you did not use any insurance.  - You can print the associated coupon and take it with   your prescription to the pharmacy.  - You may also stop by our office during regular business hours and pick up a GoodRx coupon card.  - If you need your prescription sent electronically to a different pharmacy, notify our office through [place] MyChart or by phone at [phone] option 4.     Si Usted Necesita Algo Despus de Su Visita  Tambin puede enviarnos un mensaje a travs de MyChart. Por lo general respondemos a los mensajes de MyChart en el transcurso de 1 a 2  das hbiles.  Para renovar recetas, por favor pida a su farmacia que se ponga en contacto con nuestra oficina. Nuestro nmero de fax es el [phone].  Si tiene un asunto urgente cuando la clnica est cerrada y que no puede esperar hasta el siguiente da hbil, puede llamar/localizar a su doctor(a) al nmero que aparece a continuacin.   Por favor, tenga en cuenta que aunque hacemos todo lo posible para estar disponibles para asuntos urgentes fuera del horario de oficina, no estamos disponibles las 24 horas del da, los 7 das de la semana.   Si tiene un problema urgente y no puede comunicarse con nosotros, puede optar por buscar atencin mdica  en el consultorio de su doctor(a), en una clnica privada, en un centro de atencin urgente o en una sala de emergencias.  Si tiene una emergencia mdica, por favor llame inmediatamente al 911 o vaya a la sala de emergencias.  Nmeros de bper  - Dr. Kowalski: [phone]  - Dra. Moye: [phone]  - Dra. Stewart: [phone]  En caso de inclemencias del tiempo, por favor llame a nuestra lnea principal al [phone] para una actualizacin sobre el estado de cualquier retraso o cierre.  Consejos para la medicacin en dermatologa: Por favor, guarde las cajas en las que vienen los medicamentos de uso tpico para ayudarle a seguir las instrucciones sobre dnde y cmo usarlos. Las farmacias generalmente imprimen las instrucciones del medicamento slo en las cajas y no directamente en los tubos del medicamento.   Si su medicamento es muy caro, por favor, pngase en contacto con nuestra oficina llamando al [phone] y presione la opcin 4 o envenos un mensaje a travs de MyChart.   No podemos decirle cul ser su copago por los medicamentos por adelantado ya que esto es diferente dependiendo de la cobertura de su seguro. Sin embargo, es posible que podamos encontrar un medicamento sustituto a menor costo o llenar un formulario para que el  seguro cubra el medicamento que se considera necesario.   Si se requiere una autorizacin previa para que su compaa de seguros cubra su medicamento, por favor permtanos de 1 a 2 das hbiles para completar este proceso.  Los precios de los medicamentos varan con frecuencia dependiendo del lugar de dnde se surte la receta y alguna farmacias pueden ofrecer precios ms baratos.  El sitio web www.goodrx.com tiene cupones para medicamentos de diferentes farmacias. Los precios aqu no tienen en cuenta lo que podra costar con la ayuda del seguro (puede ser ms barato con su seguro), pero el sitio web puede darle el precio si no utiliz ningn seguro.  - Puede imprimir el cupn correspondiente y llevarlo con su receta a la farmacia.  - Tambin puede pasar por nuestra oficina durante el horario de atencin regular y recoger una tarjeta de cupones de GoodRx.  - Si necesita que su receta se enve electrnicamente a una farmacia diferente, informe a nuestra oficina a travs de MyChart de [place]   o por telfono llamando al [phone] y presione la opcin 4.  

## 2022-10-17 NOTE — Progress Notes (Signed)
   EST Patient Visit   Subjective  Brianna Calhoun is a 67 y.o. female who presents for the following: Botox for facial elastosis She gets Botox on certain areas of face and head for Migraines from her Neurologist.  The following portions of the chart were reviewed this encounter and updated as appropriate: medications, allergies, medical history  Review of Systems:  No other skin or systemic complaints except as noted in HPI or Assessment and Plan.  Objective  Well appearing patient in no apparent distress; mood and affect are within normal limits.  A focused examination was performed of the face.  Relevant physical exam findings are noted in the Assessment and Plan.  Before botox photos         Injection map photo      Assessment & Plan    Facial Elastosis  For DAO - 4 units each side total 8 units of Botox  Location: See attached image  Informed consent: Discussed risks (infection, pain, bleeding, bruising, swelling, allergic reaction, paralysis of nearby muscles, eyelid droop, double vision, neck weakness, difficulty breathing, headache, undesirable cosmetic result, and need for additional treatment) and benefits of the procedure, as well as the alternatives.  Informed consent was obtained.  Preparation: The area was cleansed with alcohol.  Procedure Details:  Botox was injected into the dermis with a 30-gauge needle. Pressure applied to any bleeding. Ice packs offered for swelling.  Lot Number:  [id number] Expiration:  09/2024  Total Units Injected:  8  Plan: Tylenol may be used for headache.  Allow 2 weeks before returning to clinic for additional dosing as needed. Patient will call for any problems.  Return for keep follow up as scheduled, 3 - 4 month botox .  IAsher Muir, CMA, am acting as scribe for Armida Sans, MD.  Documentation: I have reviewed the above documentation for accuracy and completeness, and I agree with the above.  Armida Sans,  MD

## 2022-10-18 ENCOUNTER — Encounter: Payer: Self-pay | Admitting: Dermatology

## 2022-10-19 ENCOUNTER — Encounter: Payer: Medicare Other | Admitting: Physical Therapy

## 2022-10-19 ENCOUNTER — Ambulatory Visit: Payer: Medicare Other | Attending: Specialist | Admitting: Physical Therapy

## 2022-10-19 DIAGNOSIS — M533 Sacrococcygeal disorders, not elsewhere classified: Secondary | ICD-10-CM

## 2022-10-19 DIAGNOSIS — M545 Low back pain, unspecified: Secondary | ICD-10-CM

## 2022-10-19 DIAGNOSIS — G8929 Other chronic pain: Secondary | ICD-10-CM | POA: Diagnosis present

## 2022-10-19 DIAGNOSIS — M25562 Pain in left knee: Secondary | ICD-10-CM | POA: Diagnosis present

## 2022-10-19 DIAGNOSIS — M542 Cervicalgia: Secondary | ICD-10-CM

## 2022-10-19 DIAGNOSIS — M25561 Pain in right knee: Secondary | ICD-10-CM | POA: Diagnosis present

## 2022-10-19 DIAGNOSIS — R278 Other lack of coordination: Secondary | ICD-10-CM

## 2022-10-19 DIAGNOSIS — M2170 Unequal limb length (acquired), unspecified site: Secondary | ICD-10-CM | POA: Diagnosis present

## 2022-10-19 DIAGNOSIS — M6208 Separation of muscle (nontraumatic), other site: Secondary | ICD-10-CM

## 2022-10-19 NOTE — Therapy (Signed)
OUTPATIENT PHYSICAL THERAPY  Treatment / Discharge Summary across 9 visits   Patient Name: Brianna Calhoun MRN: [id number] DOB:1955-12-18, 67 y.o., female Today's Date: 10/19/2022   PT End of Session - 10/19/22 0939     Visit Number 9    Number of Visits 10    Date for PT Re-Evaluation 10/20/22   eval 6/28   PT Start Time 0937    PT Stop Time 1015    PT Time Calculation (min) 38 min    Activity Tolerance Patient tolerated treatment well;No increased pain    Behavior During Therapy [facility] for tasks assessed/performed               Past Medical History:  Diagnosis Date   Actinic keratosis    Basal cell carcinoma 04/13/2008   Right temple   Basal cell carcinoma 10/07/2019   Right chest   Basal cell carcinoma (BCC) of face 03/08/2009   Right temple, recurrent   Basal cell carcinoma (BCC) of face 11/28/2015   Right temple, hairline. Nodular pattern   Basal cell carcinoma of trunk 03/29/2014   Right paraspinal upper back. Superficial    Cancer    skin   GERD (gastroesophageal reflux disease)    Hyperlipidemia    Migraine    Squamous cell carcinoma of skin 05/24/2021   Left chest [facility]   Past Surgical History:  Procedure Laterality Date   CERVICAL CERCLAGE     6/84, 1/90, 8/93 under epidurals   CESAREAN SECTION  1984, 1986, 1990, 1994   CHOLECYSTECTOMY  1994   COLONOSCOPY N/A 12/13/2014   Procedure: COLONOSCOPY;  Surgeon: Scot Jun, MD;  Location: [facility] ENDOSCOPY;  Service: Endoscopy;  Laterality: N/A;   EYE SURGERY  1998   HERNIA REPAIR  1996   left hernia repair   LEFT OOPHORECTOMY Left 2005   multiple complex cysts   llq incisional hernia repair & mini-tummy tuck  07/1994   nasal surgeries     9/87, 9/00, 11/04; deviated septum, ethmoidectomy, rhinoplasty, rhinoplasty revision   Patient Active Problem List   Diagnosis Date Noted   Elevated hemoglobin 10/10/2022   Osteopenia after menopause 10/04/2021   Urge incontinence 10/21/2020   Elevated blood  pressure reading in office with white coat syndrome, without diagnosis of hypertension 10/21/2020   Arthralgia of both hands 10/21/2020   Urinary frequency 10/21/2020   Perineal irritation in female 01/17/2020   Hyperplastic polyp of large intestine 01/15/2020   Diverticulosis of colon 01/15/2020   Internal hemorrhoid, bleeding 01/15/2020   Chronic migraine without aura without status migrainosus, not intractable 09/21/2019   History of migraine headaches 07/30/2019   Educated about COVID-19 virus infection 01/16/2019   Statin intolerance 08/08/2017   Encounter for preventive health examination 06/03/2017   Hyperlipidemia LDL goal <130 10/16/2016   Anxiety state 10/16/2016   Allergy to iodinated contrast media 10/16/2016    PCP: Darrick Huntsman   REFERRING PROVIDER: Philippa Chester DIAG:  M17.0 (ICD-10-CM) - Bilateral primary osteoarthritis of knee  M54.30 (ICD-10-CM) - Sciatica, unspecified side    Rationale for Evaluation and Treatment Rehabilitation  THERAPY DIAG:  Chronic right-sided low back pain without sciatica  Sacrococcygeal disorders, not elsewhere classified  Leg length discrepancy  Neck pain  Chronic pain of right knee  Chronic pain of left knee  Other lack of coordination  Diastasis recti  ONSET DATE:  end of March 2023  SUBJECTIVE:  SUBJECTIVE STATEMENT TODAY:  Over the past month, pt has had less pain, more mobility, less pain with mobility. Legs are stronger and feet awareness was huge and she is still working on that. Thoracic involvement was huge.      SUBJECTIVE STATEMENT on EVAL 07/25/22  1) B knee pain:  Pt was playing pickleball and she came down on her R knee and something catch in her SI joint and it radiated down her back of her knee.  Then her knee started  hurting. Xrays and MRI showed inflammation, nothing was torn. Currently there is pain behind knee cap with up and down stairs.  R is worst than L. 7/10. Pain eases in an inconsistent day.   2) LBP:  started in June ( 6 months ago) after knees started hurt. This started 3 days after RSV vaccine. She could move but she had to tell her brain to pick legs up.  Pt got COVID in Aug-Sept. Pt noticed Covid related Sx : breathing in a cloud. Pt took a Z pack and it cleared up. Pt thinks she had lng-Covid for two weeks. Pt noticed there is no pattern to when back pain occurs. It does occur in the monring but there are days when she does not have it. Pt has gone skiing and she had no LBP nor kneepain the next day. She feels the knee pain and LBP are interwined and activities help relief the pain at both areas. Pt stopped wearing her shoe lift for past 6 months.  Achiness occurred with sleeping from the back of her back to the back of her knee but it  has not happened again in the last several weeks.   3) neck pain   PERTINENT HISTORY:  Pt will be going on a cruise Feb 15-25 .  Pt completed PT Sept 2022 and felt no LBP , vaginal pressure ,  urinary urgency , nor hemorrhoids since completing PT. Pt goes to the gym and modifies her routine and avoids crunches. Pt has picked back up doing her deep core exercises.   PAIN:  Are you having pain? See above   PRECAUTIONS: None  WEIGHT BEARING RESTRICTIONS: No  FALLS:  Has patient fallen in last 6 months? 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  PLOF: Independent  PATIENT GOALS:  Get rid of pain and be ready for pickleball season and cruise    OBJECTIVE:   [facility] PT Assessment - 10/19/22 0946       Strength   Overall Strength Comments B hip ext, abd 5/5      Palpation   Spinal mobility 3 fingers width  between low rib and iliac crest B    Palpation comment hypomobile talocrural joint, midfoot joints and intrinstric mm to promote DF/EV, toe abduction R             [facility] Adult PT Treatment/Exercise - 10/19/22 1211       Therapeutic Activites    Other Therapeutic Activities reassessed goals, discussed d/c , reviewed HEP      Manual Therapy   Internal Pelvic Floor distraction at talocrural joint, PA/AP mob Grade II-III, STM/MWM at R midfoot joints and intrinstric mm to promote DF/EV, toe abduction R                HOME EXERCISE PROGRAM: See pt instruction section  ASSESSMENT:  CLINICAL IMPRESSION:   Pt met 100% of her goals. Her scoliosis and leg length difference have been corrected which has helped with her midback and LBP. Hip strength and deep core strength has improved. Pt has returned to all her sports, fitness, and family activities.  Pt is compliant with HEP. Pt has gained more body awareness and  propioception. Pt is ready for d/c.    OBJECTIVE IMPAIRMENTS decreased activity tolerance, decreased coordination, decreased endurance, decreased mobility, difficulty walking, decreased ROM, decreased strength, decreased safety awareness, hypomobility, increased muscle spasms, impaired flexibility, improper body mechanics, postural dysfunction, and pain.    ACTIVITY LIMITATIONS  self-care, physical activities, home chores, work tasks    PARTICIPATION LIMITATIONS:  community, gym activities    PERSONAL FACTORS   leg length difference is  affecting patient's functional outcome.    REHAB POTENTIAL: Good   CLINICAL DECISION MAKING: Evolving/moderate complexity   EVALUATION COMPLEXITY: Moderate    PATIENT EDUCATION:    Education details: Showed pt anatomy images. Explained muscles attachments/ connection, physiology of deep core system/ spinal- thoracic-pelvis-lower kinetic chain as they relate to pt's presentation, Sx, and past Hx. Explained what and how these  areas of deficits need to be restored to balance and function    See Therapeutic activity / neuromuscular re-education section  Answered pt's questions.   Person educated: Patient Education method: Explanation, Demonstration, Tactile cues, Verbal cues, and Handouts Education comprehension: verbalized understanding, returned demonstration, verbal cues required, tactile cues required, and needs further education     PLAN: PT FREQUENCY: 1x/week   PT DURATION: 10 weeks   PLANNED INTERVENTIONS: Therapeutic exercises, Therapeutic activity, Neuromuscular re-education, Balance training, Gait training, Patient/Family education, Self Care, Joint mobilization, Spinal mobilization, Moist heat, Taping, and Manual therapy, dry needling.   PLAN FOR NEXT SESSION: See clinical impression for plan     GOALS: Goals reviewed with patient? Yes  SHORT TERM GOALS: Target date:08/22/2022    Pt will demo IND with HEP                    Baseline: Not IND            Goal status: MET   LONG TERM GOALS: Target date: 10/03/2022    1.Pt will demo proper deep core coordination without chest breathing and optimal excursion of diaphragm/pelvic floor in order to promote spinal stability and pelvic floor function  Baseline: dyscoordination Goal status:MET   2.  Pt will demo > 5 pt change on FOTO  to improve QOL and function   Lumber baseline  -   Goal status: INITIAL  3.  Pt will demo proper body mechanics in against gravity tasks and ADLs  work tasks, fitness  to minimize straining pelvic floor / back                  Baseline: not IND, improper form that places strain on pelvic floor                Goal status: M ET     4. Pt will demo increased hip strength > 45 bilaterally in order to minimize relapse of pain   Baseline:   hip R flexion 3/5, L 4/5,   B hip flexion 3/5, L hip abd 3/5, R 3 +/5, hip ext B 3/5,   coordination of multidifis, glut, hamstring: lacked glut activation secondary in  sequence   Goal status: MET     5. Pt will demo sit to stand  without UE support on chair arm without cues across 2 visits in order to demo proper strength and body awareness to minimize fall risks  Baseline:  uses B UE on armrest  Goal status: MET  6. Pt will demo no knee pain and proper alignment, improved DF/EV,  and eccentric control with stairs  Baseline: knee pain on R loading leg ( lacking DF/EV) on ascension, poor eccentric control on descent  Goal status:  MET      Mariane Masters, PT 10/19/2022, 9:40 AM

## 2022-10-19 NOTE — Patient Instructions (Signed)
Feet care :  Self -feet massage  ? ?Handshake : fingers between toes, moving ballmounds/toes back and forth several times while other hand anchors at arch. Do the same at the hind/mid foot.  ?Heel to toes upward to a letter Big Letter T strokes to spread ballmounds and toes, several times, pinch between webs of toes  ?Run finger tips along top of foot between long bones "comb between the bones"  ? ? ?Wiggle toes and spread them out when relaxing  ? ?

## 2022-10-29 ENCOUNTER — Ambulatory Visit
Admission: RE | Admit: 2022-10-29 | Discharge: 2022-10-29 | Disposition: A | Discharge status: Home / Self Care | Payer: Medicare Other | Source: Ambulatory Visit | Attending: Internal Medicine | Admitting: Internal Medicine

## 2022-10-29 DIAGNOSIS — Z1231 Encounter for screening mammogram for malignant neoplasm of breast: Secondary | ICD-10-CM | POA: Diagnosis not present

## 2022-11-06 ENCOUNTER — Ambulatory Visit (INDEPENDENT_AMBULATORY_CARE_PROVIDER_SITE_OTHER): Payer: Medicare Other | Admitting: Neurology

## 2022-11-06 DIAGNOSIS — G43709 Chronic migraine without aura, not intractable, without status migrainosus: Secondary | ICD-10-CM

## 2022-11-06 MED ORDER — ONABOTULINUMTOXINA 100 UNITS IJ SOLR
155.0000 [IU] | Freq: Once | INTRAMUSCULAR | Status: AC
  Administered 2022-11-06: 155 [IU] via INTRAMUSCULAR

## 2022-11-06 NOTE — Progress Notes (Signed)
Consent Form Botulism Toxin Injection For Chronic Migraine   11/06/2022: Stable doing great 08/13/2022: stable doing well. Now only having 4 migraine days a month and < 8 total headache days a month exceptional improvement > 50% in freq and severity will prescribe nurtec.   Meds ordered this encounter  Medications   botulinum toxin Type A (BOTOX) injection 155 Units    Botox- 100 units x 2 vials Lot: [id number] Expiration: 12/2024 NDC: 6295-2841-32  Bacteriostatic 0.9% Sodium Chloride- 2 mL  Lot: [id number] Expiration: 05/2024 NDC: 25366-440-34  Dx: G43.709  B/B Witnessed by Delmer Islam    05/17/2022: doing great, stable 02/21/2022: doing great, stable Interval history 11/22/2021: Still doing great, greater than 80% improvement in frequency of headaches and migraines.  Reviewed orally with patient, additionally signature is on file:  Botulism toxin has been approved by the Federal drug administration for treatment of chronic migraine. Botulism toxin does not cure chronic migraine and it may not be effective in some patients.  The administration of botulism toxin is accomplished by injecting a small amount of toxin into the muscles of the neck and head. Dosage must be titrated for each individual. Any benefits resulting from botulism toxin tend to wear off after 3 months with a repeat injection required if benefit is to be maintained. Injections are usually done every 3-4 months with maximum effect peak achieved by about 2 or 3 weeks. Botulism toxin is expensive and you should be sure of what costs you will incur resulting from the injection.  The side effects of botulism toxin use for chronic migraine may include:   -Transient, and usually mild, facial weakness with facial injections  -Transient, and usually mild, head or neck weakness with head/neck injections  -Reduction or loss of forehead facial animation due to forehead muscle weakness  -Eyelid drooping  -Dry eye  -Pain at the site  of injection or bruising at the site of injection  -Double vision  -Potential unknown long term risks  Contraindications: You should not have Botox if you are pregnant, nursing, allergic to albumin, have an infection, skin condition, or muscle weakness at the site of the injection, or have myasthenia gravis, Lambert-Eaton syndrome, or ALS.  It is also possible that as with any injection, there may be an allergic reaction or no effect from the medication. Reduced effectiveness after repeated injections is sometimes seen and rarely infection at the injection site may occur. All care will be taken to prevent these side effects. If therapy is given over a long time, atrophy and wasting in the muscle injected may occur. Occasionally the patient's become refractory to treatment because they develop antibodies to the toxin. In this event, therapy needs to be modified.  I have read the above information and consent to the administration of botulism toxin.    BOTOX PROCEDURE NOTE FOR MIGRAINE HEADACHE    Contraindications and precautions discussed with patient(above). Aseptic procedure was observed and patient tolerated procedure. Procedure performed by Dr. Artemio Aly  The condition has existed for more than 6 months, and pt does not have a diagnosis of ALS, Myasthenia Gravis or Lambert-Eaton Syndrome.  Risks and benefits of injections discussed and pt agrees to proceed with the procedure.  Written consent obtained  These injections are medically necessary. Pt  receives good benefits from these injections. These injections do not cause sedations or hallucinations which the oral therapies may cause.  Description of procedure:  The patient was placed in a sitting position. The standard protocol was used  for Botox as follows, with 5 units of Botox injected at each site:   -Procerus muscle, midline injection  -Corrugator muscle, bilateral injection  -Frontalis muscle, bilateral injection, with 2  sites each side, medial injection was performed in the upper one third of the frontalis muscle, in the region vertical from the medial inferior edge of the superior orbital rim. The lateral injection was again in the upper one third of the forehead vertically above the lateral limbus of the cornea, 1.5 cm lateral to the medial injection site.  -Temporalis muscle injection, 4 sites, bilaterally. The first injection was 3 cm above the tragus of the ear, second injection site was 1.5 cm to 3 cm up from the first injection site in line with the tragus of the ear. The third injection site was 1.5-3 cm forward between the first 2 injection sites. The fourth injection site was 1.5 cm posterior to the second injection site.   -Occipitalis muscle injection, 3 sites, bilaterally. The first injection was done one half way between the occipital protuberance and the tip of the mastoid process behind the ear. The second injection site was done lateral and superior to the first, 1 fingerbreadth from the first injection. The third injection site was 1 fingerbreadth superiorly and medially from the first injection site.  -Cervical paraspinal muscle injection, 2 sites, bilateral knee first injection site was 1 cm from the midline of the cervical spine, 3 cm inferior to the lower border of the occipital protuberance. The second injection site was 1.5 cm superiorly and laterally to the first injection site.  -Trapezius muscle injection was performed at 3 sites, bilaterally. The first injection site was in the upper trapezius muscle halfway between the inflection point of the neck, and the acromion. The second injection site was one half way between the acromion and the first injection site. The third injection was done between the first injection site and the inflection point of the neck.   Will return for repeat injection in 3 months.   155 units of Botox was used, 45U Botox not injected was wasted. The patient tolerated  the procedure well, there were no complications of the above procedure.

## 2022-11-06 NOTE — Progress Notes (Signed)
Botox- 100 units x 2 vials Lot: [id number] Expiration: 12/2024 NDC: 5409-8119-14  Bacteriostatic 0.9% Sodium Chloride- 2 mL  Lot: [id number] Expiration: 05/2024 NDC: 13086-578-46  Dx: N62.952  B/B Witnessed by Delmer Islam

## 2022-12-26 ENCOUNTER — Ambulatory Visit (INDEPENDENT_AMBULATORY_CARE_PROVIDER_SITE_OTHER): Payer: Medicare Other | Admitting: Dermatology

## 2022-12-26 VITALS — BP 132/78 | HR 64

## 2022-12-26 DIAGNOSIS — B078 Other viral warts: Secondary | ICD-10-CM | POA: Diagnosis not present

## 2022-12-26 DIAGNOSIS — L820 Inflamed seborrheic keratosis: Secondary | ICD-10-CM

## 2022-12-26 DIAGNOSIS — L988 Other specified disorders of the skin and subcutaneous tissue: Secondary | ICD-10-CM | POA: Diagnosis not present

## 2022-12-26 DIAGNOSIS — Z7189 Other specified counseling: Secondary | ICD-10-CM

## 2022-12-26 NOTE — Progress Notes (Signed)
Follow-Up Visit   Subjective  Brianna Calhoun is a 66 y.o. female who presents for the following: 3 months f/u on warts on the right thumb and left foot treated with LN2 and Cantharidin with a fair response 3 months ago.  The patient has spots, moles and lesions to be evaluated, some may be new or changing and the patient may have concern these could be cancer.  The following portions of the chart were reviewed this encounter and updated as appropriate: medications, allergies, medical history  Review of Systems:  No other skin or systemic complaints except as noted in HPI or Assessment and Plan.  Objective  Well appearing patient in no apparent distress; mood and affect are within normal limits. A focused examination was performed of the following areas:face,right thumb, left foot  Relevant exam findings are noted in the Assessment and Plan.  left foot, right thumb (2) Verrucous papules -- Discussed viral etiology and contagion.   Left Shoulder x 1, legs x 20 (21) Stuck-on, waxy, tan-brown papule --Discussed benign etiology and prognosis.    Assessment & Plan   Other viral warts (2) left foot, right thumb  Viral Wart (HPV) Counseling  Discussed viral / HPV (Human Papilloma Virus) etiology and risk of spread /infectivity to other areas of body as well as to other people.  Multiple treatments and methods may be required to clear warts and it is possible treatment may not be successful.  Treatment risks include discoloration; scarring and there is still potential for wart recurrence.    Squaric Acid 3% applied to warts today. Prior to application reviewed risk of inflammation and irritation.   Destruction of lesion - left foot, right thumb Complexity: simple   Destruction method: cryotherapy   Informed consent: discussed and consent obtained   Timeout:  patient name, date of birth, surgical site, and procedure verified Lesion destroyed using liquid nitrogen: Yes   Region frozen  until ice ball extended beyond lesion: Yes   Outcome: patient tolerated procedure well with no complications   Post-procedure details: wound care instructions given   Additional details:  Patient advised to set alarm to remind them to wash off with soap and water at the directed time.  Destruction of lesion - left foot, right thumb  Destruction method: chemical removal   Informed consent: discussed and consent obtained   Timeout:  patient name, date of birth, surgical site, and procedure verified Chemical destruction method: cantharidin   Procedure instructions: patient instructed to wash and dry area   Outcome: patient tolerated procedure well with no complications   Post-procedure details: wound care instructions given   Additional details:  Patient advised to set alarm to remind them to wash off with soap and water at the directed time.  Inflamed seborrheic keratosis (21) Left Shoulder x 1, legs x 20  Symptomatic, irritating, patient would like treated.   Destruction of lesion - Left Shoulder x 1, legs x 20 Complexity: simple   Destruction method: cryotherapy   Informed consent: discussed and consent obtained   Timeout:  patient name, date of birth, surgical site, and procedure verified Lesion destroyed using liquid nitrogen: Yes   Region frozen until ice ball extended beyond lesion: Yes   Outcome: patient tolerated procedure well with no complications   Post-procedure details: wound care instructions given     Facial Elastosis For DAO Recommend filler and Botox at next cosmetic office visit   Return for recheck at next scheduled appt .  Andee Poles  Chrisandra Carota, CMA, am acting as scribe for Armida Sans, MD .   Documentation: I have reviewed the above documentation for accuracy and completeness, and I agree with the above.  Armida Sans, MD

## 2022-12-26 NOTE — Patient Instructions (Addendum)
Cryotherapy Aftercare  Wash gently with soap and water everyday.   Apply Vaseline and Band-Aid daily until healed.      Instructions for After In-Office Application of Cantharidin  1. This is a strong medicine; please follow ALL instructions.  2. Gently wash off with soap and water in four hours or sooner s directed by your physician.  3. **WARNING** this medicine can cause severe blistering, blood blisters, infection, and/or scarring if it is not washed off as directed.  4. Your progress will be rechecked in 1-2 months; call sooner if there are any questions or problems.      Due to recent changes in healthcare laws, you may see results of your pathology and/or laboratory studies on MyChart before the doctors have had a chance to review them. We understand that in some cases there may be results that are confusing or concerning to you. Please understand that not all results are received at the same time and often the doctors may need to interpret multiple results in order to provide you with the best plan of care or course of treatment. Therefore, we ask that you please give us 2 business days to thoroughly review all your results before contacting the office for clarification. Should we see a critical lab result, you will be contacted sooner.   If You Need Anything After Your Visit  If you have any questions or concerns for your doctor, please call our main line at [phone] and press option 4 to reach your doctor's medical assistant. If no one answers, please leave a voicemail as directed and we will return your call as soon as possible. Messages left after 4 pm will be answered the following business day.   You may also send us a message via MyChart. We typically respond to MyChart messages within 1-2 business days.  For prescription refills, please ask your pharmacy to contact our office. Our fax number is [phone].  If you have an urgent issue when the clinic is closed  that cannot wait until the next business day, you can page your doctor at the number below.    Please note that while we do our best to be available for urgent issues outside of office hours, we are not available 24/7.   If you have an urgent issue and are unable to reach us, you may choose to seek medical care at your doctor's office, retail clinic, urgent care center, or emergency room.  If you have a medical emergency, please immediately call 911 or go to the emergency department.  Pager Numbers  - Dr. Kowalski: [phone]  - Dr. Moye: [phone]  - Dr. Stewart: [phone]  In the event of inclement weather, please call our main line at [phone] for an update on the status of any delays or closures.  Dermatology Medication Tips: Please keep the boxes that topical medications come in in order to help keep track of the instructions about where and how to use these. Pharmacies typically print the medication instructions only on the boxes and not directly on the medication tubes.   If your medication is too expensive, please contact our office at [phone] option 4 or send us a message through MyChart.   We are unable to tell what your co-pay for medications will be in advance as this is different depending on your insurance coverage. However, we may be able to find a substitute medication at lower cost or fill out paperwork to get insurance to cover a needed medication.     If a prior authorization is required to get your medication covered by your insurance company, please allow us 1-2 business days to complete this process.  Drug prices often vary depending on where the prescription is filled and some pharmacies may offer cheaper prices.  The website www.goodrx.com contains coupons for medications through different pharmacies. The prices here do not account for what the cost may be with help from insurance (it may be cheaper with your insurance), but the website can give you  the price if you did not use any insurance.  - You can print the associated coupon and take it with your prescription to the pharmacy.  - You may also stop by our office during regular business hours and pick up a GoodRx coupon card.  - If you need your prescription sent electronically to a different pharmacy, notify our office through [facility] MyChart or by phone at [phone] option 4.     Si Usted Necesita Algo Despus de Su Visita  Tambin puede enviarnos un mensaje a travs de MyChart. Por lo general respondemos a los mensajes de MyChart en el transcurso de 1 a 2 das hbiles.  Para renovar recetas, por favor pida a su farmacia que se ponga en contacto con nuestra oficina. Nuestro nmero de fax es el [phone].  Si tiene un asunto urgente cuando la clnica est cerrada y que no puede esperar hasta el siguiente da hbil, puede llamar/localizar a su doctor(a) al nmero que aparece a continuacin.   Por favor, tenga en cuenta que aunque hacemos todo lo posible para estar disponibles para asuntos urgentes fuera del horario de oficina, no estamos disponibles las 24 horas del da, los 7 das de la semana.   Si tiene un problema urgente y no puede comunicarse con nosotros, puede optar por buscar atencin mdica  en el consultorio de su doctor(a), en una clnica privada, en un centro de atencin urgente o en una sala de emergencias.  Si tiene una emergencia mdica, por favor llame inmediatamente al 911 o vaya a la sala de emergencias.  Nmeros de bper  - Dr. Kowalski: [phone]  - Dra. Moye: [phone]  - Dra. Stewart: [phone]  En caso de inclemencias del tiempo, por favor llame a nuestra lnea principal al [phone] para una actualizacin sobre el estado de cualquier retraso o cierre.  Consejos para la medicacin en dermatologa: Por favor, guarde las cajas en las que vienen los medicamentos de uso tpico para ayudarle a seguir las instrucciones sobre dnde y  cmo usarlos. Las farmacias generalmente imprimen las instrucciones del medicamento slo en las cajas y no directamente en los tubos del medicamento.   Si su medicamento es muy caro, por favor, pngase en contacto con nuestra oficina llamando al [phone] y presione la opcin 4 o envenos un mensaje a travs de MyChart.   No podemos decirle cul ser su copago por los medicamentos por adelantado ya que esto es diferente dependiendo de la cobertura de su seguro. Sin embargo, es posible que podamos encontrar un medicamento sustituto a menor costo o llenar un formulario para que el seguro cubra el medicamento que se considera necesario.   Si se requiere una autorizacin previa para que su compaa de seguros cubra su medicamento, por favor permtanos de 1 a 2 das hbiles para completar este proceso.  Los precios de los medicamentos varan con frecuencia dependiendo del lugar de dnde se surte la receta y alguna farmacias pueden ofrecer precios ms baratos.  El sitio web www.goodrx.com   tiene cupones para medicamentos de diferentes farmacias. Los precios aqu no tienen en cuenta lo que podra costar con la ayuda del seguro (puede ser ms barato con su seguro), pero el sitio web puede darle el precio si no utiliz ningn seguro.  - Puede imprimir el cupn correspondiente y llevarlo con su receta a la farmacia.  - Tambin puede pasar por nuestra oficina durante el horario de atencin regular y recoger una tarjeta de cupones de GoodRx.  - Si necesita que su receta se enve electrnicamente a una farmacia diferente, informe a nuestra oficina a travs de MyChart de [facility] o por telfono llamando al [phone] y presione la opcin 4.  

## 2022-12-28 ENCOUNTER — Encounter: Payer: Self-pay | Admitting: Dermatology

## 2023-01-23 ENCOUNTER — Ambulatory Visit: Payer: Medicare Other | Admitting: Dermatology

## 2023-01-29 ENCOUNTER — Ambulatory Visit (INDEPENDENT_AMBULATORY_CARE_PROVIDER_SITE_OTHER): Payer: Medicare Other | Admitting: Neurology

## 2023-01-29 DIAGNOSIS — G43709 Chronic migraine without aura, not intractable, without status migrainosus: Secondary | ICD-10-CM

## 2023-01-29 MED ORDER — ONABOTULINUMTOXINA 200 UNITS IJ SOLR
155.0000 [IU] | Freq: Once | INTRAMUSCULAR | Status: AC
Start: 2023-01-29 — End: 2023-01-29
  Administered 2023-01-29: 155 [IU] via INTRAMUSCULAR

## 2023-01-29 NOTE — Progress Notes (Signed)
Botox- 200 units x 1 vial Lot: [id number] Expiration: 08/2024 NDC: 0347-4259-56  Bacteriostatic 0.9% Sodium Chloride- 4 mL  Lot: [id number] Expiration: 05/09/2024 NDC: 3329-5188-41  Dx: Y60.630  B/B Witnessed by Maryjean Ka

## 2023-01-29 NOTE — Progress Notes (Signed)
Consent Form Botulism Toxin Injection For Chronic Migraine  01/29/2023: stable 11/06/2022: Stable doing great 08/13/2022: stable doing well. Now only having 4 migraine days a month and < 8 total headache days a month exceptional improvement > 50% in freq and severity will prescribe nurtec.   Meds ordered this encounter  Medications   botulinum toxin Type A (BOTOX) injection 155 Units    Botox- 200 units x 1 vial Lot: [id number] Expiration: 08/2024 NDC: 1610-9604-54  Bacteriostatic 0.9% Sodium Chloride- 4 mL  Lot: [id number] Expiration: 05/09/2024 NDC: 1478-2956-21  Dx: H08.657  B/B Witnessed by Diamond,CMA    05/17/2022: doing great, stable 02/21/2022: doing great, stable Interval history 11/22/2021: Still doing great, greater than 80% improvement in frequency of headaches and migraines.  Reviewed orally with patient, additionally signature is on file:  Botulism toxin has been approved by the Federal drug administration for treatment of chronic migraine. Botulism toxin does not cure chronic migraine and it may not be effective in some patients.  The administration of botulism toxin is accomplished by injecting a small amount of toxin into the muscles of the neck and head. Dosage must be titrated for each individual. Any benefits resulting from botulism toxin tend to wear off after 3 months with a repeat injection required if benefit is to be maintained. Injections are usually done every 3-4 months with maximum effect peak achieved by about 2 or 3 weeks. Botulism toxin is expensive and you should be sure of what costs you will incur resulting from the injection.  The side effects of botulism toxin use for chronic migraine may include:   -Transient, and usually mild, facial weakness with facial injections  -Transient, and usually mild, head or neck weakness with head/neck injections  -Reduction or loss of forehead facial animation due to forehead muscle weakness  -Eyelid drooping  -Dry  eye  -Pain at the site of injection or bruising at the site of injection  -Double vision  -Potential unknown long term risks  Contraindications: You should not have Botox if you are pregnant, nursing, allergic to albumin, have an infection, skin condition, or muscle weakness at the site of the injection, or have myasthenia gravis, Lambert-Eaton syndrome, or ALS.  It is also possible that as with any injection, there may be an allergic reaction or no effect from the medication. Reduced effectiveness after repeated injections is sometimes seen and rarely infection at the injection site may occur. All care will be taken to prevent these side effects. If therapy is given over a long time, atrophy and wasting in the muscle injected may occur. Occasionally the patient's become refractory to treatment because they develop antibodies to the toxin. In this event, therapy needs to be modified.  I have read the above information and consent to the administration of botulism toxin.    BOTOX PROCEDURE NOTE FOR MIGRAINE HEADACHE    Contraindications and precautions discussed with patient(above). Aseptic procedure was observed and patient tolerated procedure. Procedure performed by Dr. Artemio Aly  The condition has existed for more than 6 months, and pt does not have a diagnosis of ALS, Myasthenia Gravis or Lambert-Eaton Syndrome.  Risks and benefits of injections discussed and pt agrees to proceed with the procedure.  Written consent obtained  These injections are medically necessary. Pt  receives good benefits from these injections. These injections do not cause sedations or hallucinations which the oral therapies may cause.  Description of procedure:  The patient was placed in a sitting position. The standard protocol was  used for Botox as follows, with 5 units of Botox injected at each site:   -Procerus muscle, midline injection  -Corrugator muscle, bilateral injection  -Frontalis muscle,  bilateral injection, with 2 sites each side, medial injection was performed in the upper one third of the frontalis muscle, in the region vertical from the medial inferior edge of the superior orbital rim. The lateral injection was again in the upper one third of the forehead vertically above the lateral limbus of the cornea, 1.5 cm lateral to the medial injection site.  -Temporalis muscle injection, 4 sites, bilaterally. The first injection was 3 cm above the tragus of the ear, second injection site was 1.5 cm to 3 cm up from the first injection site in line with the tragus of the ear. The third injection site was 1.5-3 cm forward between the first 2 injection sites. The fourth injection site was 1.5 cm posterior to the second injection site.   -Occipitalis muscle injection, 3 sites, bilaterally. The first injection was done one half way between the occipital protuberance and the tip of the mastoid process behind the ear. The second injection site was done lateral and superior to the first, 1 fingerbreadth from the first injection. The third injection site was 1 fingerbreadth superiorly and medially from the first injection site.  -Cervical paraspinal muscle injection, 2 sites, bilateral knee first injection site was 1 cm from the midline of the cervical spine, 3 cm inferior to the lower border of the occipital protuberance. The second injection site was 1.5 cm superiorly and laterally to the first injection site.  -Trapezius muscle injection was performed at 3 sites, bilaterally. The first injection site was in the upper trapezius muscle halfway between the inflection point of the neck, and the acromion. The second injection site was one half way between the acromion and the first injection site. The third injection was done between the first injection site and the inflection point of the neck.   Will return for repeat injection in 3 months.   155 units of Botox was used, 45U Botox not injected was  wasted. The patient tolerated the procedure well, there were no complications of the above procedure.

## 2023-02-05 ENCOUNTER — Ambulatory Visit: Payer: Medicare Other | Admitting: Dermatology

## 2023-02-19 IMAGING — CT CT CARDIAC CORONARY ARTERY CALCIUM SCORE
2 of 3 series · 10 of 20 positions shown, 12 images · non-contrast
Comparison: None.
COMPARISON: None.
COMPARISON: None.
COMPARISON: None.

Addendum:
EXAM:
OVER-READ INTERPRETATION  CT CHEST

The following report is an over-read performed by radiologist Dr.
Jullon Lienad [REDACTED] on 11/23/2020. This over-read
does not include interpretation of cardiac or coronary anatomy or
pathology. The calcium score interpretation by the cardiologist is
attached.
CLINICAL DATA: Cardiovascular Disease Risk stratification
Coronary Calcium Score
TECHNIQUE: A gated, non-contrast computed tomography scan of the heart was
performed using 3mm slice thickness. Axial images were analyzed on a
dedicated workstation. Calcium scoring of the coronary arteries was
performed using the Agatston method.
CLINICAL DATA: Risk stratification
TECHNIQUE: The patient was scanned on a Siemens Somatom go.Top Scanner. Axial
non-contrast 3 mm slices were carried out through the heart. The
data set was analyzed on a dedicated work station and scored using
the Agatson method.

[Series 5: full fov st calcium scoring 3.00 · axial · 0.65mm/px · z∈[-1165,-1075]mm · 5 of 46 slices shown, 7 images]
[im 8/46  vessel]
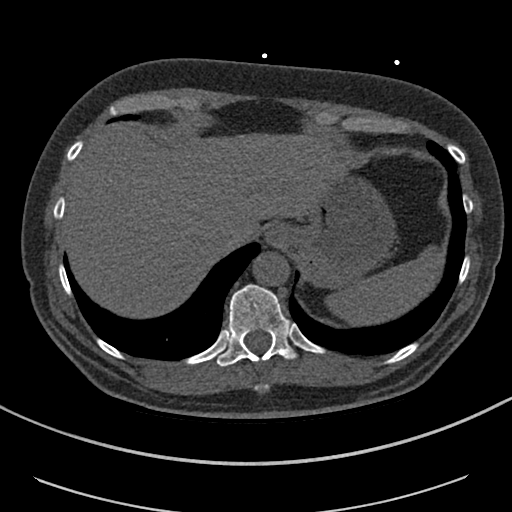
[im 8/46  lung]
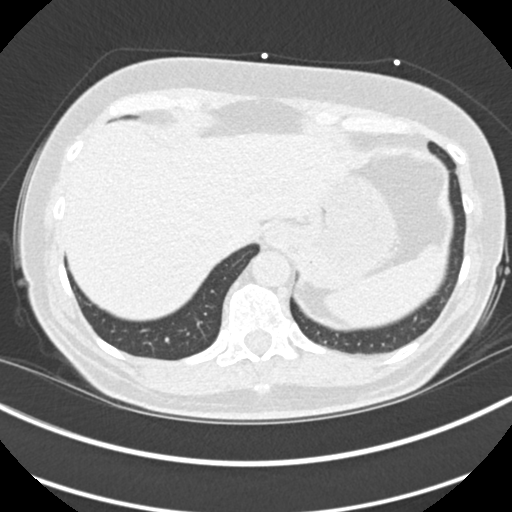
[im 16/46  vessel]
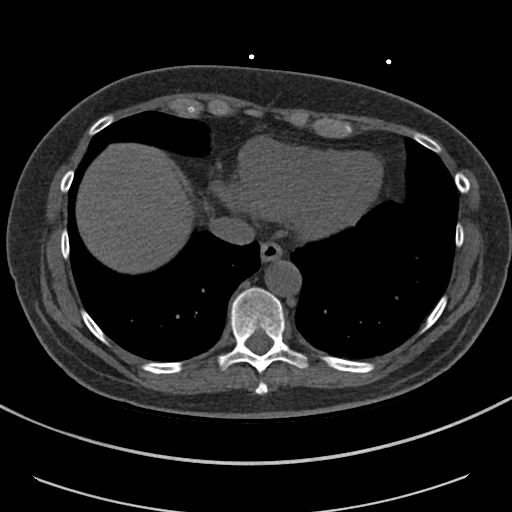
[im 23/46  vessel]
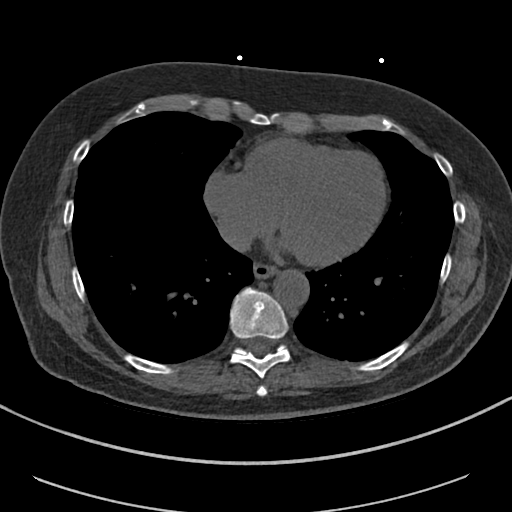
[im 31/46  vessel]
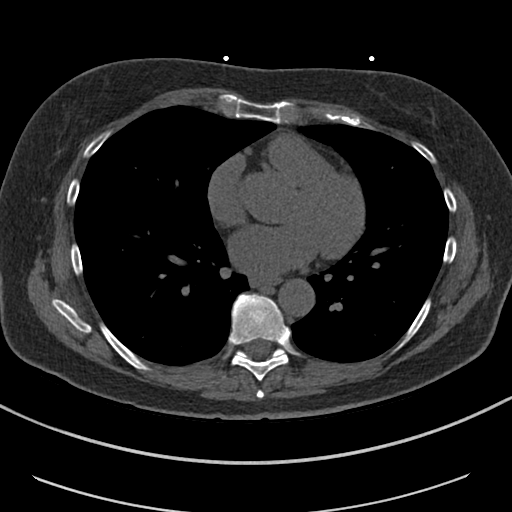
[im 38/46  vessel]
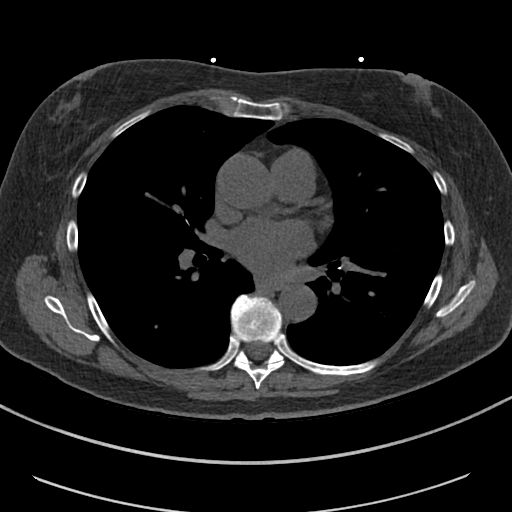
[im 38/46  lung]
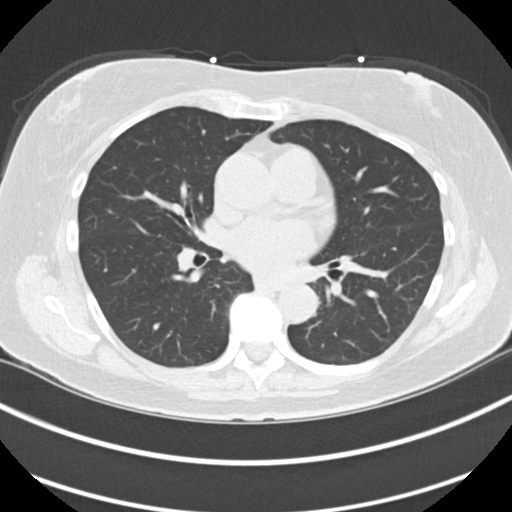

[Series 10: full fov lungs calcium scoring 3.00 ax · axial · 0.65mm/px · z∈[-1165,-1075]mm · 5 of 46 slices shown]
[im 8/46  vessel]
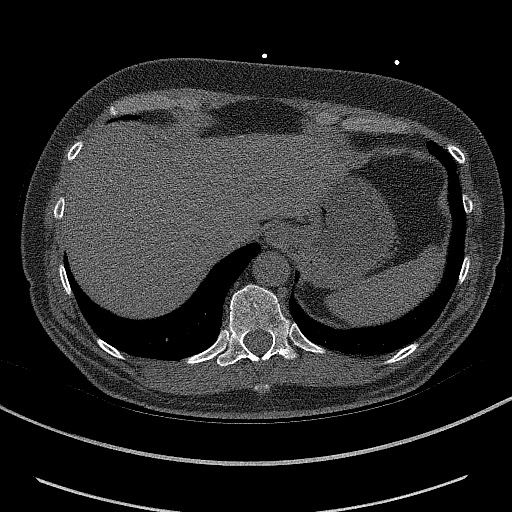
[im 16/46  vessel]
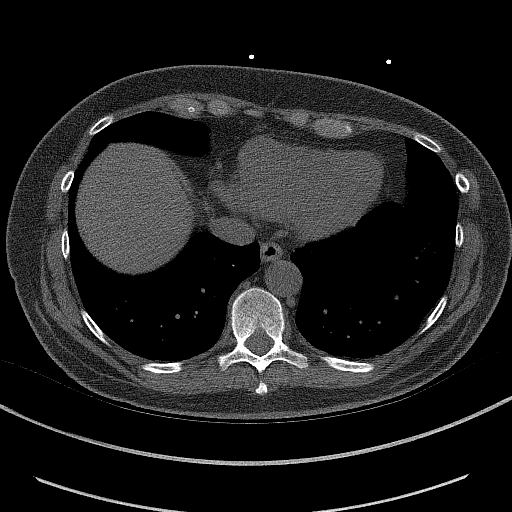
[im 23/46  vessel]
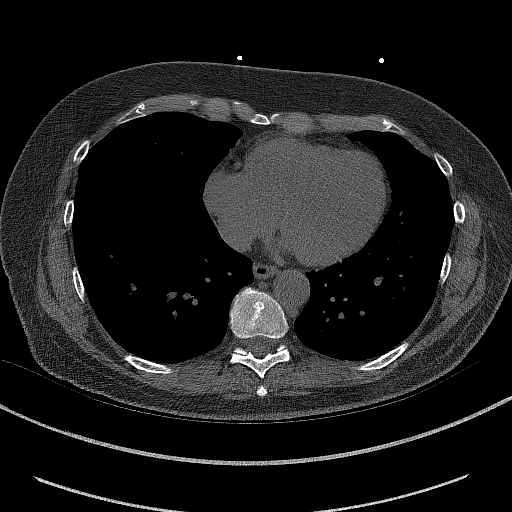
[im 31/46  vessel]
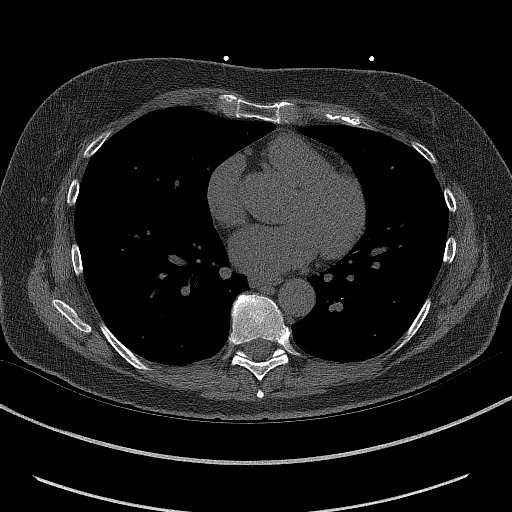
[im 38/46  vessel]
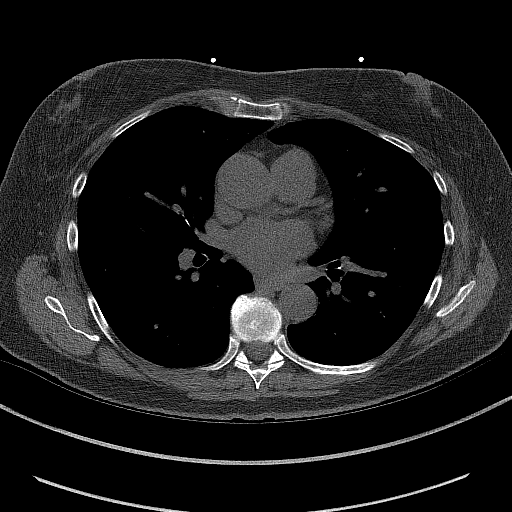

[10 of 20 positions shown; findings below may reference images not displayed]

FINDINGS: Vascular: Normal aortic caliber.

Mediastinum/Nodes: No imaged thoracic adenopathy.

Lungs/Pleura: No pleural fluid.  Mild centrilobular emphysema.

Upper Abdomen: Normal imaged portions of the liver, spleen, stomach.

Musculoskeletal: No acute osseous abnormality.
IMPRESSION: 1. No acute findings in the imaged extracardiac chest.
2. Emphysema (4HRHY-X6N.W).
FINDINGS: Coronary arteries: Normal origins.

Coronary Calcium Score:

Left main: 0

Left anterior descending artery: 0

Left circumflex artery: 0

Right coronary artery: 0

Total: 0

Pericardium: Normal.

Ascending Aorta: Normal caliber.

Non-cardiac: See separate report from [REDACTED].
IMPRESSION: Coronary calcium score of 0 Agatston units. This was suggests low
risk for future cardiac events.



If CAC=0, it is reasonable to withhold statin therapy and reassess
in 5 to 10 years, as long as higher risk conditions are absent
(diabetes mellitus, family history of premature CHD in first degree
relatives (males <55 years; females <65 years), cigarette smoking,
or LDL >=190 mg/dL).

If CAC is 1 to 99, it is reasonable to initiate statin therapy for
patients >=55 years of age.

If CAC is >=100 or >=75th percentile, it is reasonable to initiate
statin therapy at any age.

Cardiology referral should be considered for patients with CAC
scores >=400 or >=75th percentile.

*3647 AHA/ACC/AACVPR/AAPA/ABC/ARGETA/AWILDA/NAZARETH/Sheix/ADAIA/SAVINA DANIEL/DULIN
Guideline on the Management of Blood Cholesterol: A Report of the
American College of Cardiology/American Heart Association Task Force
on Clinical Practice Guidelines. J Am Coll Cardiol.
0265;73(24):0239-0451.
FINDINGS: Non-cardiac: See separate report from [REDACTED].

Ascending Aorta: Normal size

Pericardium: Normal

Coronary arteries: Normal origin of left and right coronary
arteries. Distribution of arterial calcifications if present, as
noted below;

LM 0

LAD 0

LCx 0

RCA 0

Total 0

IMPRESSION AND RECOMMENDATION:
1. Normal coronary calcium score of 0. Patient is low risk for
coronary events.

2.  CAC 0, FLORIANNA ALESIA0.

3.  Continue heart healthy lifestyle and risk factor modification.

Savio Locklear

*** End of Addendum ***
Addendum:
EXAM:
OVER-READ INTERPRETATION  CT CHEST

The following report is an over-read performed by radiologist Dr.
Jullon Lienad [REDACTED] on 11/23/2020. This over-read
does not include interpretation of cardiac or coronary anatomy or
pathology. The calcium score interpretation by the cardiologist is
attached.
FINDINGS: Vascular: Normal aortic caliber.

Mediastinum/Nodes: No imaged thoracic adenopathy.

Lungs/Pleura: No pleural fluid.  Mild centrilobular emphysema.

Upper Abdomen: Normal imaged portions of the liver, spleen, stomach.

Musculoskeletal: No acute osseous abnormality.
IMPRESSION: 1. No acute findings in the imaged extracardiac chest.
2. Emphysema (4HRHY-X6N.W).
FINDINGS: Coronary arteries: Normal origins.

Coronary Calcium Score:

Left main: 0

Left anterior descending artery: 0

Left circumflex artery: 0

Right coronary artery: 0

Total: 0

Pericardium: Normal.

Ascending Aorta: Normal caliber.

Non-cardiac: See separate report from [REDACTED].
IMPRESSION: Coronary calcium score of 0 Agatston units. This was suggests low
risk for future cardiac events.



If CAC=0, it is reasonable to withhold statin therapy and reassess
in 5 to 10 years, as long as higher risk conditions are absent
(diabetes mellitus, family history of premature CHD in first degree
relatives (males <55 years; females <65 years), cigarette smoking,
or LDL >=190 mg/dL).

If CAC is 1 to 99, it is reasonable to initiate statin therapy for
patients >=55 years of age.

If CAC is >=100 or >=75th percentile, it is reasonable to initiate
statin therapy at any age.

Cardiology referral should be considered for patients with CAC
scores >=400 or >=75th percentile.

*3647 AHA/ACC/AACVPR/AAPA/ABC/ARGETA/AWILDA/NAZARETH/Sheix/ADAIA/SAVINA DANIEL/DULIN
Guideline on the Management of Blood Cholesterol: A Report of the
American College of Cardiology/American Heart Association Task Force
on Clinical Practice Guidelines. J Am Coll Cardiol.
0265;73(24):0239-0451.

*** End of Addendum ***
Addendum:
EXAM:
OVER-READ INTERPRETATION  CT CHEST

The following report is an over-read performed by radiologist Dr.
Jullon Lienad [REDACTED] on 11/23/2020. This over-read
does not include interpretation of cardiac or coronary anatomy or
pathology. The calcium score interpretation by the cardiologist is
attached.
FINDINGS: Vascular: Normal aortic caliber.

Mediastinum/Nodes: No imaged thoracic adenopathy.

Lungs/Pleura: No pleural fluid.  Mild centrilobular emphysema.

Upper Abdomen: Normal imaged portions of the liver, spleen, stomach.

Musculoskeletal: No acute osseous abnormality.
IMPRESSION: 1. No acute findings in the imaged extracardiac chest.
2. Emphysema (4HRHY-X6N.W).
FINDINGS: Coronary arteries: Normal origins.

Coronary Calcium Score:

Left main: 0

Left anterior descending artery: 0

Left circumflex artery: 0

Right coronary artery: 0

Total: 0

Pericardium: Normal.

Ascending Aorta: Normal caliber.

Non-cardiac: See separate report from [REDACTED].
IMPRESSION: Coronary calcium score of 0 Agatston units. This was suggests low
risk for future cardiac events.



If CAC=0, it is reasonable to withhold statin therapy and reassess
in 5 to 10 years, as long as higher risk conditions are absent
(diabetes mellitus, family history of premature CHD in first degree
relatives (males <55 years; females <65 years), cigarette smoking,
or LDL >=190 mg/dL).

If CAC is 1 to 99, it is reasonable to initiate statin therapy for
patients >=55 years of age.

If CAC is >=100 or >=75th percentile, it is reasonable to initiate
statin therapy at any age.

Cardiology referral should be considered for patients with CAC
scores >=400 or >=75th percentile.

*3647 AHA/ACC/AACVPR/AAPA/ABC/ARGETA/AWILDA/NAZARETH/Sheix/ADAIA/SAVINA DANIEL/DULIN
Guideline on the Management of Blood Cholesterol: A Report of the
American College of Cardiology/American Heart Association Task Force
on Clinical Practice Guidelines. J Am Coll Cardiol.
0265;73(24):0239-0451.

*** End of Addendum ***
EXAM:
OVER-READ INTERPRETATION  CT CHEST

The following report is an over-read performed by radiologist Dr.
Jullon Lienad [REDACTED] on 11/23/2020. This over-read
does not include interpretation of cardiac or coronary anatomy or
pathology. The calcium score interpretation by the cardiologist is
attached.
FINDINGS: Vascular: Normal aortic caliber.

Mediastinum/Nodes: No imaged thoracic adenopathy.

Lungs/Pleura: No pleural fluid.  Mild centrilobular emphysema.

Upper Abdomen: Normal imaged portions of the liver, spleen, stomach.

Musculoskeletal: No acute osseous abnormality.
IMPRESSION: 1. No acute findings in the imaged extracardiac chest.
2. Emphysema (4HRHY-X6N.W).

## 2023-04-03 ENCOUNTER — Encounter: Payer: Self-pay | Admitting: Dermatology

## 2023-04-03 ENCOUNTER — Ambulatory Visit: Payer: Medicare Other | Admitting: Dermatology

## 2023-04-03 DIAGNOSIS — S90211A Contusion of right great toe with damage to nail, initial encounter: Secondary | ICD-10-CM | POA: Diagnosis not present

## 2023-04-03 DIAGNOSIS — L820 Inflamed seborrheic keratosis: Secondary | ICD-10-CM | POA: Diagnosis not present

## 2023-04-03 DIAGNOSIS — L578 Other skin changes due to chronic exposure to nonionizing radiation: Secondary | ICD-10-CM | POA: Diagnosis not present

## 2023-04-03 DIAGNOSIS — L821 Other seborrheic keratosis: Secondary | ICD-10-CM

## 2023-04-03 DIAGNOSIS — L570 Actinic keratosis: Secondary | ICD-10-CM | POA: Diagnosis not present

## 2023-04-03 DIAGNOSIS — Z1283 Encounter for screening for malignant neoplasm of skin: Secondary | ICD-10-CM | POA: Diagnosis not present

## 2023-04-03 DIAGNOSIS — W908XXA Exposure to other nonionizing radiation, initial encounter: Secondary | ICD-10-CM

## 2023-04-03 DIAGNOSIS — D1801 Hemangioma of skin and subcutaneous tissue: Secondary | ICD-10-CM

## 2023-04-03 DIAGNOSIS — L814 Other melanin hyperpigmentation: Secondary | ICD-10-CM

## 2023-04-03 DIAGNOSIS — D229 Melanocytic nevi, unspecified: Secondary | ICD-10-CM

## 2023-04-03 DIAGNOSIS — Z85828 Personal history of other malignant neoplasm of skin: Secondary | ICD-10-CM

## 2023-04-03 NOTE — Progress Notes (Signed)
Follow-Up Visit   Subjective  Brianna Calhoun is a 67 y.o. female who presents for the following: Yearly Skin Cancer Screening and Full Body Skin Exam, hx of BCC.  3 months f/u on warts on the left foot and right thumb treated with LN2 with a good response.   The patient presents for Total-Body Skin Exam (TBSE) for skin cancer screening and mole check. The patient has spots, moles and lesions to be evaluated, some may be new or changing and the patient may have concern these could be cancer.    The following portions of the chart were reviewed this encounter and updated as appropriate: medications, allergies, medical history  Review of Systems:  No other skin or systemic complaints except as noted in HPI or Assessment and Plan.  Objective  Well appearing patient in no apparent distress; mood and affect are within normal limits.  A full examination was performed including scalp, head, eyes, ears, nose, lips, neck, chest, axillae, abdomen, back, buttocks, bilateral upper extremities, bilateral lower extremities, hands, feet, fingers, toes, fingernails, and toenails. All findings within normal limits unless otherwise noted below.   Relevant physical exam findings are noted in the Assessment and Plan.  Scalp, hairline x 5, left cheek x 2 (7) Erythematous thin papules/macules with gritty scale.   hairline x 1, left side burn x 1, left mandible x 2, left forearm x 1, chest x 1, left calf x 2, right thigh x 1 (9) Stuck-on, waxy, tan-brown papules -- Discussed benign etiology and prognosis.     Assessment & Plan   SKIN CANCER SCREENING PERFORMED TODAY.  TRAUMA Dried blood under the the right great toenail  Blood should eventually grow out Observe    ACTINIC DAMAGE - Chronic condition, secondary to cumulative UV/sun exposure - diffuse scaly erythematous macules with underlying dyspigmentation - Recommend daily broad spectrum sunscreen SPF 30+ to sun-exposed areas, reapply every 2  hours as needed.  - Staying in the shade or wearing long sleeves, sun glasses (UVA+UVB protection) and wide brim hats (4-inch brim around the entire circumference of the hat) are also recommended for sun protection.  - Call for new or changing lesions.  LENTIGINES, SEBORRHEIC KERATOSES, HEMANGIOMAS - Benign normal skin lesions - Benign-appearing - Call for any changes  MELANOCYTIC NEVI - Tan-brown and/or pink-flesh-colored symmetric macules and papules - Benign appearing on exam today - Observation - Call clinic for new or changing moles - Recommend daily use of broad spectrum spf 30+ sunscreen to sun-exposed areas.    HISTORY OF BASAL CELL CARCINOMA OF THE SKIN - No evidence of recurrence today - Recommend regular full body skin exams - Recommend daily broad spectrum sunscreen SPF 30+ to sun-exposed areas, reapply every 2 hours as needed.  - Call if any new or changing lesions are noted between office visits    AK (actinic keratosis) (7) Scalp, hairline x 5, left cheek x 2  Actinic keratoses are precancerous spots that appear secondary to cumulative UV radiation exposure/sun exposure over time. They are chronic with expected duration over 1 year. A portion of actinic keratoses will progress to squamous cell carcinoma of the skin. It is not possible to reliably predict which spots will progress to skin cancer and so treatment is recommended to prevent development of skin cancer.  Recommend daily broad spectrum sunscreen SPF 30+ to sun-exposed areas, reapply every 2 hours as needed.  Recommend staying in the shade or wearing long sleeves, sun glasses (UVA+UVB protection) and wide brim  hats (4-inch brim around the entire circumference of the hat). Call for new or changing lesions.   Destruction of lesion - Scalp, hairline x 5, left cheek x 2 (7) Complexity: simple   Destruction method: cryotherapy   Informed consent: discussed and consent obtained   Timeout:  patient name, date of  birth, surgical site, and procedure verified Lesion destroyed using liquid nitrogen: Yes   Region frozen until ice ball extended beyond lesion: Yes   Outcome: patient tolerated procedure well with no complications   Post-procedure details: wound care instructions given    Inflamed seborrheic keratosis (9) hairline x 1, left side burn x 1, left mandible x 2, left forearm x 1, chest x 1, left calf x 2, right thigh x 1  Symptomatic, irritating, patient would like treated.   Destruction of lesion - hairline x 1, left side burn x 1, left mandible x 2, left forearm x 1, chest x 1, left calf x 2, right thigh x 1 (9) Complexity: simple   Destruction method: cryotherapy   Informed consent: discussed and consent obtained   Timeout:  patient name, date of birth, surgical site, and procedure verified Lesion destroyed using liquid nitrogen: Yes   Region frozen until ice ball extended beyond lesion: Yes   Outcome: patient tolerated procedure well with no complications   Post-procedure details: wound care instructions given     Return in about 1 year (around 04/02/2024) for TBSE, hx of BCC.  IAngelique Holm, CMA, am acting as scribe for Armida Sans, MD .   Documentation: I have reviewed the above documentation for accuracy and completeness, and I agree with the above.  Armida Sans, MD

## 2023-04-03 NOTE — Patient Instructions (Addendum)

## 2023-04-18 ENCOUNTER — Other Ambulatory Visit (INDEPENDENT_AMBULATORY_CARE_PROVIDER_SITE_OTHER): Payer: Medicare Other

## 2023-04-18 ENCOUNTER — Other Ambulatory Visit: Payer: Medicare Other

## 2023-04-18 DIAGNOSIS — D582 Other hemoglobinopathies: Secondary | ICD-10-CM | POA: Diagnosis not present

## 2023-04-18 LAB — CBC WITH DIFFERENTIAL/PLATELET
Basophils Absolute: 0.1 10*3/uL (ref 0.0–0.1)
Basophils Relative: 0.8 % (ref 0.0–3.0)
Eosinophils Absolute: 0.1 10*3/uL (ref 0.0–0.7)
Eosinophils Relative: 1.1 % (ref 0.0–5.0)
HCT: 41.1 % (ref 36.0–46.0)
Hemoglobin: 13.6 g/dL (ref 12.0–15.0)
Lymphocytes Relative: 49.1 % — ABNORMAL HIGH (ref 12.0–46.0)
Lymphs Abs: 3.2 10*3/uL (ref 0.7–4.0)
MCHC: 33.1 g/dL (ref 30.0–36.0)
MCV: 92.2 fL (ref 78.0–100.0)
Monocytes Absolute: 0.6 10*3/uL (ref 0.1–1.0)
Monocytes Relative: 8.8 % (ref 3.0–12.0)
Neutro Abs: 2.6 10*3/uL (ref 1.4–7.7)
Neutrophils Relative %: 40.2 % — ABNORMAL LOW (ref 43.0–77.0)
Platelets: 315 10*3/uL (ref 150.0–400.0)
RBC: 4.46 Mil/uL (ref 3.87–5.11)
RDW: 12.6 % (ref 11.5–15.5)
WBC: 6.5 10*3/uL (ref 4.0–10.5)

## 2023-04-30 ENCOUNTER — Encounter: Payer: Self-pay | Admitting: Neurology

## 2023-04-30 ENCOUNTER — Ambulatory Visit (INDEPENDENT_AMBULATORY_CARE_PROVIDER_SITE_OTHER): Payer: Medicare Other | Admitting: Neurology

## 2023-04-30 DIAGNOSIS — G43709 Chronic migraine without aura, not intractable, without status migrainosus: Secondary | ICD-10-CM

## 2023-04-30 MED ORDER — ONABOTULINUMTOXINA 200 UNITS IJ SOLR
155.0000 [IU] | Freq: Once | INTRAMUSCULAR | Status: AC
Start: 2023-04-30 — End: 2023-04-30
  Administered 2023-04-30: 155 [IU] via INTRAMUSCULAR

## 2023-04-30 NOTE — Progress Notes (Signed)
Consent Form Botulism Toxin Injection For Chronic Migraine 05/01/2023: Stable, doing well. Will push out to 14 weeks, will see how that goes, she seems to do well with her migraines even longer than 12 weeks. Had a bruise last time so be careful. Put the botox in 8 evenly spaced intervals instead of 4 in the frontalis due to uncomfortable weakness there ask patient at next appointment, still the same amount of botox in the frontalis however(2.5units x8 instead of 5units x 4) 01/29/2023: stable 11/06/2022: Stable doing great 08/13/2022: stable doing well. Now only having 4 migraine days a month and < 8 total headache days a month exceptional improvement > 50% in freq and severity will prescribe nurtec.   05/17/2022: doing great, stable 02/21/2022: doing great, stable Interval history 11/22/2021: Still doing great, greater than 80% improvement in frequency of headaches and migraines.  Reviewed orally with patient, additionally signature is on file:  Botulism toxin has been approved by the Federal drug administration for treatment of chronic migraine. Botulism toxin does not cure chronic migraine and it may not be effective in some patients.  The administration of botulism toxin is accomplished by injecting a small amount of toxin into the muscles of the neck and head. Dosage must be titrated for each individual. Any benefits resulting from botulism toxin tend to wear off after 3 months with a repeat injection required if benefit is to be maintained. Injections are usually done every 3-4 months with maximum effect peak achieved by about 2 or 3 weeks. Botulism toxin is expensive and you should be sure of what costs you will incur resulting from the injection.  The side effects of botulism toxin use for chronic migraine may include:   -Transient, and usually mild, facial weakness with facial injections  -Transient, and usually mild, head or neck weakness with head/neck injections  -Reduction or loss of  forehead facial animation due to forehead muscle weakness  -Eyelid drooping  -Dry eye  -Pain at the site of injection or bruising at the site of injection  -Double vision  -Potential unknown long term risks  Contraindications: You should not have Botox if you are pregnant, nursing, allergic to albumin, have an infection, skin condition, or muscle weakness at the site of the injection, or have myasthenia gravis, Lambert-Eaton syndrome, or ALS.  It is also possible that as with any injection, there may be an allergic reaction or no effect from the medication. Reduced effectiveness after repeated injections is sometimes seen and rarely infection at the injection site may occur. All care will be taken to prevent these side effects. If therapy is given over a long time, atrophy and wasting in the muscle injected may occur. Occasionally the patient's become refractory to treatment because they develop antibodies to the toxin. In this event, therapy needs to be modified.  I have read the above information and consent to the administration of botulism toxin.    BOTOX PROCEDURE NOTE FOR MIGRAINE HEADACHE    Contraindications and precautions discussed with patient(above). Aseptic procedure was observed and patient tolerated procedure. Procedure performed by Dr. Artemio Aly  The condition has existed for more than 6 months, and pt does not have a diagnosis of ALS, Myasthenia Gravis or Lambert-Eaton Syndrome.  Risks and benefits of injections discussed and pt agrees to proceed with the procedure.  Written consent obtained  These injections are medically necessary. Pt  receives good benefits from these injections. These injections do not cause sedations or hallucinations which the oral therapies  may cause.  Description of procedure:  The patient was placed in a sitting position. The standard protocol was used for Botox as follows, with 5 units of Botox injected at each site:   -Procerus muscle,  midline injection  -Corrugator muscle, bilateral injection  -Frontalis muscle, bilateral injection, with 4 sites each side, medial injections were performed in the upper one third of the frontalis muscle, in the region vertical from the medial inferior edge of the superior orbital rim. The lateral injections were again in the upper one third of the forehead vertically above the lateral limbus of the cornea, 1.5 cm lateral to the medial injection site. Each site 2.5 units for a total of 20units in the frontalis per the botox protocol.  -Temporalis muscle injection, 4 sites, bilaterally. The first injection was 3 cm above the tragus of the ear, second injection site was 1.5 cm to 3 cm up from the first injection site in line with the tragus of the ear. The third injection site was 1.5-3 cm forward between the first 2 injection sites. The fourth injection site was 1.5 cm posterior to the second injection site.   -Occipitalis muscle injection, 3 sites, bilaterally. The first injection was done one half way between the occipital protuberance and the tip of the mastoid process behind the ear. The second injection site was done lateral and superior to the first, 1 fingerbreadth from the first injection. The third injection site was 1 fingerbreadth superiorly and medially from the first injection site.  -Cervical paraspinal muscle injection, 2 sites, bilateral knee first injection site was 1 cm from the midline of the cervical spine, 3 cm inferior to the lower border of the occipital protuberance. The second injection site was 1.5 cm superiorly and laterally to the first injection site.  -Trapezius muscle injection was performed at 3 sites, bilaterally. The first injection site was in the upper trapezius muscle halfway between the inflection point of the neck, and the acromion. The second injection site was one half way between the acromion and the first injection site. The third injection was done between the  first injection site and the inflection point of the neck.   Will return for repeat injection in 3 months.   155 units of Botox was used, 45U Botox not injected was wasted. The patient tolerated the procedure well, there were no complications of the above procedure.

## 2023-04-30 NOTE — Progress Notes (Signed)
Botox- 200 units x 1 vial Lot: [id number] Expiration: 08/2025 NDC: 5409-8119-14  Bacteriostatic 0.9% Sodium Chloride- 4 mL  Lot: [id number] Expiration: 10/08/2023 NDC: 2130-8657-84  Dx: O96.295 B/B Witnessed by Alveria Apley. CMA

## 2023-06-26 ENCOUNTER — Other Ambulatory Visit: Payer: Self-pay | Admitting: Internal Medicine

## 2023-07-29 ENCOUNTER — Telehealth: Payer: Self-pay | Admitting: Neurology

## 2023-07-29 ENCOUNTER — Encounter: Payer: Self-pay | Admitting: Neurology

## 2023-07-29 NOTE — Telephone Encounter (Signed)
Confirming botox appointment details

## 2023-07-30 ENCOUNTER — Telehealth: Payer: Self-pay | Admitting: Neurology

## 2023-07-30 NOTE — Telephone Encounter (Signed)
Pt rescheduled appt due to having to go the cancer center with her brother. Rescheduled for 3/18 as she will be out of town the last week of February. Pt would like to be seen sooner if possible. Requesting call back to discuss

## 2023-07-30 NOTE — Telephone Encounter (Signed)
It looks like 1/20 spot pt was offered has been taken, I held spot for her with Dr. Lucia Gaskins on 1/24.

## 2023-08-06 ENCOUNTER — Ambulatory Visit: Payer: Medicare Other | Admitting: Neurology

## 2023-08-15 ENCOUNTER — Ambulatory Visit (INDEPENDENT_AMBULATORY_CARE_PROVIDER_SITE_OTHER): Payer: Medicare Other | Admitting: Neurology

## 2023-08-15 VITALS — BP 147/68 | HR 70

## 2023-08-15 DIAGNOSIS — G43709 Chronic migraine without aura, not intractable, without status migrainosus: Secondary | ICD-10-CM

## 2023-08-15 DIAGNOSIS — G43009 Migraine without aura, not intractable, without status migrainosus: Secondary | ICD-10-CM

## 2023-08-15 MED ORDER — ONABOTULINUMTOXINA 200 UNITS IJ SOLR
200.0000 [IU] | Freq: Once | INTRAMUSCULAR | Status: AC
Start: 2023-08-15 — End: 2023-08-15
  Administered 2023-08-15: 155 [IU] via INTRAMUSCULAR

## 2023-08-15 NOTE — Progress Notes (Signed)
 Consent Form Botulism Toxin Injection For Chronic Migraine     08/15/2023: reviewed the botox  protocol with patient as above. Transition to NP.   Reviewed orally with patient, additionally signature is on file:  Botulism toxin has been approved by the Federal drug administration for treatment of chronic migraine. Botulism toxin does not cure chronic migraine and it may not be effective in some patients.  The administration of botulism toxin is accomplished by injecting a small amount of toxin into the muscles of the neck and head. Dosage must be titrated for each individual. Any benefits resulting from botulism toxin tend to wear off after 3 months with a repeat injection required if benefit is to be maintained. Injections are usually done every 3-4 months with maximum effect peak achieved by about 2 or 3 weeks. Botulism toxin is expensive and you should be sure of what costs you will incur resulting from the injection.  The side effects of botulism toxin use for chronic migraine may include:   -Transient, and usually mild, facial weakness with facial injections  -Transient, and usually mild, head or neck weakness with head/neck injections  -Reduction or loss of forehead facial animation due to forehead muscle weakness  -Eyelid drooping  -Dry eye  -Pain at the site of injection or bruising at the site of injection  -Double vision  -Potential unknown long term risks  Contraindications: You should not have Botox  if you are pregnant, nursing, allergic to albumin, have an infection, skin condition, or muscle weakness at the site of the injection, or have myasthenia gravis, Lambert-Eaton syndrome, or ALS.  It is also possible that as with any injection, there may be an allergic reaction or no effect from the medication. Reduced effectiveness after repeated injections is sometimes seen and rarely infection at the injection site may occur. All care will be taken to prevent these side effects.  If therapy is given over a long time, atrophy and wasting in the muscle injected may occur. Occasionally the patient's become refractory to treatment because they develop antibodies to the toxin. In this event, therapy needs to be modified.  I have read the above information and consent to the administration of botulism toxin.    BOTOX  PROCEDURE NOTE FOR MIGRAINE HEADACHE    Contraindications and precautions discussed with patient(above). Aseptic procedure was observed and patient tolerated procedure. Procedure performed by Dr. Andree Epp  The condition has existed for more than 6 months, and pt does not have a diagnosis of ALS, Myasthenia Gravis or Lambert-Eaton Syndrome.  Risks and benefits of injections discussed and pt agrees to proceed with the procedure.  Written consent obtained  These injections are medically necessary. Pt  receives good benefits from these injections. These injections do not cause sedations or hallucinations which the oral therapies may cause.  Description of procedure:  The patient was placed in a sitting position. The standard protocol was used for Botox  as follows, with 5 units of Botox  injected at each site:   -Procerus muscle, midline injection  -Corrugator muscle, bilateral injection  -Frontalis muscle, bilateral injection, with 4 sites each side, medial injections were performed in the upper one third of the frontalis muscle, in the region vertical from the medial inferior edge of the superior orbital rim. The lateral injections were again in the upper one third of the forehead vertically above the lateral limbus of the cornea, 1.5 cm lateral to the medial injection site. Each site 2.5 units for a total of 20units in  the frontalis per the botox  protocol.  -Temporalis muscle injection, 4 sites, bilaterally. The first injection was 3 cm above the tragus of the ear, second injection site was 1.5 cm to 3 cm up from the first injection site in line with the  tragus of the ear. The third injection site was 1.5-3 cm forward between the first 2 injection sites. The fourth injection site was 1.5 cm posterior to the second injection site.   -Occipitalis muscle injection, 3 sites, bilaterally. The first injection was done one half way between the occipital protuberance and the tip of the mastoid process behind the ear. The second injection site was done lateral and superior to the first, 1 fingerbreadth from the first injection. The third injection site was 1 fingerbreadth superiorly and medially from the first injection site.  -Cervical paraspinal muscle injection, 2 sites, bilateral knee first injection site was 1 cm from the midline of the cervical spine, 3 cm inferior to the lower border of the occipital protuberance. The second injection site was 1.5 cm superiorly and laterally to the first injection site.  -Trapezius muscle injection was performed at 3 sites, bilaterally. The first injection site was in the upper trapezius muscle halfway between the inflection point of the neck, and the acromion. The second injection site was one half way between the acromion and the first injection site. The third injection was done between the first injection site and the inflection point of the neck.   Will return for repeat injection in 3 months.   155 units of Botox  was used, 45U Botox  not injected was wasted. The patient tolerated the procedure well, there were no complications of the above procedure.

## 2023-08-15 NOTE — Progress Notes (Signed)
 Botox - 200 units x 1 vial Lot: [id number] Expiration: 2027/04 NDC: 0023-3921-02  Bacteriostatic 0.9% Sodium Chloride - 4 mL  Lot: [id number] Expiration: 05/09/24 NDC: 959083397  Dx:  Chronic migraine without aura without status migrainosus, not intractable  - Primary G43.709  B/B Witnessed by HEATHER BROCKS RN

## 2023-09-24 ENCOUNTER — Ambulatory Visit: Payer: Medicare Other | Admitting: Neurology

## 2023-09-25 ENCOUNTER — Encounter: Payer: Self-pay | Admitting: Internal Medicine

## 2023-09-25 ENCOUNTER — Ambulatory Visit: Payer: Medicare Other | Admitting: Internal Medicine

## 2023-09-25 VITALS — BP 117/64 | HR 86 | Ht 65.0 in | Wt 139.4 lb

## 2023-09-25 DIAGNOSIS — R7301 Impaired fasting glucose: Secondary | ICD-10-CM | POA: Diagnosis not present

## 2023-09-25 DIAGNOSIS — R5383 Other fatigue: Secondary | ICD-10-CM

## 2023-09-25 DIAGNOSIS — Z780 Asymptomatic menopausal state: Secondary | ICD-10-CM

## 2023-09-25 DIAGNOSIS — Z8052 Family history of malignant neoplasm of bladder: Secondary | ICD-10-CM

## 2023-09-25 DIAGNOSIS — E785 Hyperlipidemia, unspecified: Secondary | ICD-10-CM

## 2023-09-25 DIAGNOSIS — R7303 Prediabetes: Secondary | ICD-10-CM

## 2023-09-25 DIAGNOSIS — N3941 Urge incontinence: Secondary | ICD-10-CM

## 2023-09-25 DIAGNOSIS — Z1231 Encounter for screening mammogram for malignant neoplasm of breast: Secondary | ICD-10-CM

## 2023-09-25 DIAGNOSIS — G43709 Chronic migraine without aura, not intractable, without status migrainosus: Secondary | ICD-10-CM

## 2023-09-25 DIAGNOSIS — M8580 Other specified disorders of bone density and structure, unspecified site: Secondary | ICD-10-CM

## 2023-09-25 DIAGNOSIS — K5730 Diverticulosis of large intestine without perforation or abscess without bleeding: Secondary | ICD-10-CM

## 2023-09-25 DIAGNOSIS — R030 Elevated blood-pressure reading, without diagnosis of hypertension: Secondary | ICD-10-CM

## 2023-09-25 DIAGNOSIS — Z809 Family history of malignant neoplasm, unspecified: Secondary | ICD-10-CM

## 2023-09-25 DIAGNOSIS — L989 Disorder of the skin and subcutaneous tissue, unspecified: Secondary | ICD-10-CM

## 2023-09-25 LAB — LIPID PANEL
Cholesterol: 187 mg/dL (ref 0–200)
HDL: 53.3 mg/dL (ref >39.00)
LDL Cholesterol: 121 mg/dL — ABNORMAL HIGH (ref 0–99)
NonHDL: 133.37
Total CHOL/HDL Ratio: 4
Triglycerides: 60 mg/dL (ref 0.0–149.0)
VLDL: 12 mg/dL (ref 0.0–40.0)

## 2023-09-25 LAB — COMPREHENSIVE METABOLIC PANEL
ALT: 17 U/L (ref 0–35)
AST: 21 U/L (ref 0–37)
Albumin: 4.5 g/dL (ref 3.5–5.2)
Alkaline Phosphatase: 70 U/L (ref 39–117)
BUN: 16 mg/dL (ref 6–23)
CO2: 28 meq/L (ref 19–32)
Calcium: 9.1 mg/dL (ref 8.4–10.5)
Chloride: 102 meq/L (ref 96–112)
Creatinine, Ser: 0.79 mg/dL (ref 0.40–1.20)
GFR: 76.09 mL/min (ref >60.00)
Glucose, Bld: 92 mg/dL (ref 70–99)
Potassium: 4.2 meq/L (ref 3.5–5.1)
Sodium: 137 meq/L (ref 135–145)
Total Bilirubin: 0.6 mg/dL (ref 0.2–1.2)
Total Protein: 6.6 g/dL (ref 6.0–8.3)

## 2023-09-25 LAB — URINALYSIS, ROUTINE W REFLEX MICROSCOPIC
Bilirubin Urine: NEGATIVE
Hgb urine dipstick: NEGATIVE
Leukocytes,Ua: NEGATIVE
Nitrite: NEGATIVE
RBC / HPF: NONE SEEN (ref 0–?)
Specific Gravity, Urine: 1.02 (ref 1.000–1.030)
Total Protein, Urine: NEGATIVE
Urine Glucose: NEGATIVE
Urobilinogen, UA: 0.2 (ref 0.0–1.0)
pH: 6 (ref 5.0–8.0)

## 2023-09-25 LAB — LDL CHOLESTEROL, DIRECT: Direct LDL: 126 mg/dL

## 2023-09-25 LAB — TSH: TSH: 2.03 u[IU]/mL (ref 0.35–5.50)

## 2023-09-25 LAB — HEMOGLOBIN A1C: Hgb A1c MFr Bld: 5.5 % (ref 4.6–6.5)

## 2023-09-25 NOTE — Progress Notes (Unsigned)
 Patient ID: Brianna Calhoun, female    DOB: 12-22-1955  Age: 68 y.o. MRN: [id number]  The patient is here for follow up  and management of other chronic and acute problems.   The risk factors are reflected in the social history.   The roster of all physicians providing medical care to patient - is listed in the Snapshot section of the chart.   Activities of daily living:  The patient is 100% independent in all ADLs: dressing, toileting, feeding as well as independent mobility   Home safety : The patient has smoke detectors in the home. They wear seatbelts.  There are no unsecured firearms at home. There is no violence in the home.    There is no risks for hepatitis, STDs or HIV. There is no   history of blood transfusion. They have no travel history to infectious disease endemic areas of the world.   The patient has seen their dentist in the last six month. They have seen their eye doctor in the last year. The patinet  denies slight hearing difficulty with regard to whispered voices and some television programs.  They have deferred audiologic testing in the last year.  They do not  have excessive sun exposure. Discussed the need for sun protection: hats, long sleeves and use of sunscreen if there is significant sun exposure.    Diet: the importance of a healthy diet is discussed. They do have a healthy diet.   The benefits of regular aerobic exercise were discussed. The patient  exercises 5 days per week  for   minimum of 60 minutes. She is a Paediatric nurse.    Depression screen: there are no signs or vegative symptoms of depression- irritability, change in appetite, anhedonia, sadness/tearfullness.   The following portions of the patient's history were reviewed and updated as appropriate: allergies, current medications, past family history, past medical history,  past surgical history, past social history  and problem list.   Visual acuity was not assessed per patient preference since the  patient has regular follow up with an  ophthalmologist. Hearing and body mass index were assessed and reviewed.   Last Derm appt Oct 2024 Gwen Pounds    During the course of the visit the patient was educated and counseled about appropriate screening and preventive services including : fall prevention , diabetes screening, nutrition counseling, colorectal cancer screening, and recommended immunizations.    Chief Complaint:  1) chronic migraine: receivign Botox injection by Dr Lucia Gaskins   2) chronic recurrent RLQ pain : reviewed prior workup which was non diagnostic.  No change in symptoms   Review of Symptoms  Patient denies headache, fevers, malaise, unintentional weight loss, skin rash, eye pain, sinus congestion and sinus pain, sore throat, dysphagia,  hemoptysis , cough, dyspnea, wheezing, chest pain, palpitations, orthopnea, edema, abdominal pain, nausea, melena, diarrhea, constipation, flank pain, dysuria, hematuria, urinary  Frequency, nocturia, numbness, tingling, seizures,  Focal weakness, Loss of consciousness,  Tremor, insomnia, depression, anxiety, and suicidal ideation.    Physical Exam:  BP 117/64   Pulse 86   Ht 5\' 5"  (1.651 m)   Wt 139 lb 6.4 oz (63.2 kg)   SpO2 98%   BMI 23.20 kg/m    Physical Exam Vitals reviewed.  Constitutional:      General: She is not in acute distress.    Appearance: Normal appearance. She is well-developed and normal weight. She is not ill-appearing, toxic-appearing or diaphoretic.  HENT:     Head: Normocephalic.  Right Ear: Tympanic membrane, ear canal and external ear normal. There is no impacted cerumen.     Left Ear: Tympanic membrane, ear canal and external ear normal. There is no impacted cerumen.     Nose: Nose normal.     Mouth/Throat:     Mouth: Mucous membranes are moist.     Pharynx: Oropharynx is clear.  Eyes:     General: No scleral icterus.       Right eye: No discharge.        Left eye: No discharge.      Conjunctiva/sclera: Conjunctivae normal.     Pupils: Pupils are equal, round, and reactive to light.  Neck:     Thyroid: No thyromegaly.     Vascular: No carotid bruit or JVD.  Cardiovascular:     Rate and Rhythm: Normal rate and regular rhythm.     Heart sounds: Normal heart sounds.  Pulmonary:     Effort: Pulmonary effort is normal. No respiratory distress.     Breath sounds: Normal breath sounds.  Chest:  Breasts:    Breasts are symmetrical.     Right: Normal. No swelling, inverted nipple, mass, nipple discharge, skin change or tenderness.     Left: Normal. No swelling, inverted nipple, mass, nipple discharge, skin change or tenderness.  Abdominal:     General: Bowel sounds are normal.     Palpations: Abdomen is soft. There is no mass.     Tenderness: There is no abdominal tenderness. There is no guarding or rebound.  Musculoskeletal:        General: Normal range of motion.     Cervical back: Normal range of motion and neck supple.  Lymphadenopathy:     Cervical: No cervical adenopathy.     Upper Body:     Right upper body: No supraclavicular, axillary or pectoral adenopathy.     Left upper body: No supraclavicular, axillary or pectoral adenopathy.  Skin:    General: Skin is warm and dry.  Neurological:     General: No focal deficit present.     Mental Status: She is alert and oriented to person, place, and time. Mental status is at baseline.  Psychiatric:        Mood and Affect: Mood normal.        Behavior: Behavior normal.        Thought Content: Thought content normal.        Judgment: Judgment normal.    Assessment and Plan: Encounter for screening mammogram for malignant neoplasm of breast -     3D Screening Mammogram, Left and Right; Future  Hyperlipidemia LDL goal <130 -     Lipid panel -     LDL cholesterol, direct  Other fatigue -     TSH  Prediabetes -     Comprehensive metabolic panel  Impaired fasting glucose -     Hemoglobin A1c  Family  history of cancer -     Ambulatory referral to Genetics  Family history of transitional cell carcinoma of bladder -     Urinalysis, Routine w reflex microscopic  Perineal irritation in female Assessment & Plan: Improved with use of Estrace prescribed by GYN    Urge incontinence Assessment & Plan: She is requesting pelvic floor muscle PT   Osteopenia after menopause Assessment & Plan: No significant change by 2023 DEXA .  Repeat n 2028   Chronic migraine without aura without status migrainosus, not intractable Assessment & Plan: Now managed by Desma Maxim  Ahern with BOTOX injections.    Elevated blood pressure reading in office with white coat syndrome, without diagnosis of hypertension Assessment & Plan: She has no history of hypertension ; readings in office have normalized and   HOME READINGS HAVE BEEN < 130/80 .  Lab Results  Component Value Date   CREATININE 0.76 09/26/2022   Lab Results  Component Value Date   NA 139 09/26/2022   K 4.9 09/26/2022   CL 104 09/26/2022   CO2 28 09/26/2022      Diverticulosis of colon Assessment & Plan: With recurrent RLQ pain  that Is intermittent and improved with IBGUARD>  reviewed last CT ab done in 2018 and last colonoscopy 2020     No follow-ups on file.  Sherlene Shams, MD

## 2023-09-25 NOTE — Assessment & Plan Note (Signed)
 With recurrent RLQ pain  that Is intermittent and improved with IBGUARD>  reviewed last CT ab done in 2018 and last colonoscopy 2020

## 2023-09-25 NOTE — Assessment & Plan Note (Signed)
 No significant change by 2023 DEXA .  Repeat n 2028

## 2023-09-25 NOTE — Patient Instructions (Addendum)
 Your annual mammogram has been ordered AND IS DUE in LATE Brianna Calhoun will not allow [place] to schedule it for you,  so please  call to make your appointment [phone]    WE WILL REPEAT YOUR  BONE DENSITY (DEXA)  IN 2028 SINCE THERE WAS VERY LITTLE CHANGE IN BMD  YOUR FOREARM has the lowest BMD (T score -2.0) ;  pickleball injury most likely to happen is a Colle's fracture  so focus on weight lifting for the forearm  to strengthen them   Send me a message if any travel plans involve cruises,  transatlantic travel etc

## 2023-09-25 NOTE — Assessment & Plan Note (Signed)
 She has no history of hypertension ; readings in office have normalized and   HOME READINGS HAVE BEEN < 130/80 .  Lab Results  Component Value Date   CREATININE 0.76 09/26/2022   Lab Results  Component Value Date   NA 139 09/26/2022   K 4.9 09/26/2022   CL 104 09/26/2022   CO2 28 09/26/2022

## 2023-09-25 NOTE — Assessment & Plan Note (Signed)
 Now managed by Naomie Dean with BOTOX

## 2023-09-25 NOTE — Assessment & Plan Note (Signed)
Improved with use of Estrace prescribed by GYN  ?

## 2023-09-25 NOTE — Assessment & Plan Note (Signed)
She is requesting pelvic floor muscle PT

## 2023-09-26 ENCOUNTER — Encounter: Payer: Self-pay | Admitting: Internal Medicine

## 2023-10-02 ENCOUNTER — Ambulatory Visit: Payer: Medicare Other | Admitting: Dermatology

## 2023-10-30 ENCOUNTER — Encounter

## 2023-10-30 ENCOUNTER — Ambulatory Visit
Admission: RE | Admit: 2023-10-30 | Discharge: 2023-10-30 | Disposition: A | Discharge status: Home / Self Care | Source: Ambulatory Visit | Attending: Internal Medicine | Admitting: Internal Medicine

## 2023-10-30 DIAGNOSIS — Z1231 Encounter for screening mammogram for malignant neoplasm of breast: Secondary | ICD-10-CM

## 2023-10-31 ENCOUNTER — Ambulatory Visit: Payer: Medicare Other | Admitting: Physical Therapy

## 2023-11-04 ENCOUNTER — Ambulatory Visit: Admitting: Dermatology

## 2023-11-07 ENCOUNTER — Encounter: Payer: Medicare Other | Admitting: Physical Therapy

## 2023-11-07 ENCOUNTER — Ambulatory Visit: Admitting: Dermatology

## 2023-11-07 DIAGNOSIS — L570 Actinic keratosis: Secondary | ICD-10-CM | POA: Diagnosis not present

## 2023-11-07 DIAGNOSIS — L814 Other melanin hyperpigmentation: Secondary | ICD-10-CM

## 2023-11-07 DIAGNOSIS — L820 Inflamed seborrheic keratosis: Secondary | ICD-10-CM | POA: Diagnosis not present

## 2023-11-07 DIAGNOSIS — W908XXA Exposure to other nonionizing radiation, initial encounter: Secondary | ICD-10-CM | POA: Diagnosis not present

## 2023-11-07 DIAGNOSIS — L821 Other seborrheic keratosis: Secondary | ICD-10-CM

## 2023-11-07 DIAGNOSIS — Z8589 Personal history of malignant neoplasm of other organs and systems: Secondary | ICD-10-CM

## 2023-11-07 DIAGNOSIS — Z1283 Encounter for screening for malignant neoplasm of skin: Secondary | ICD-10-CM | POA: Diagnosis not present

## 2023-11-07 DIAGNOSIS — D1801 Hemangioma of skin and subcutaneous tissue: Secondary | ICD-10-CM

## 2023-11-07 DIAGNOSIS — Z85828 Personal history of other malignant neoplasm of skin: Secondary | ICD-10-CM

## 2023-11-07 DIAGNOSIS — L578 Other skin changes due to chronic exposure to nonionizing radiation: Secondary | ICD-10-CM | POA: Diagnosis not present

## 2023-11-07 DIAGNOSIS — D229 Melanocytic nevi, unspecified: Secondary | ICD-10-CM

## 2023-11-07 NOTE — Progress Notes (Unsigned)
 Follow-Up Visit   Subjective  Brianna Calhoun is a 68 y.o. female who presents for the following: Skin Cancer Screening and Full Body Skin Exam Spots at right nose , spots at nose, forehead,   The patient presents for Total-Body Skin Exam (TBSE) for skin cancer screening and mole check. The patient has spots, moles and lesions to be evaluated, some may be new or changing and the patient may have concern these could be cancer.  The following portions of the chart were reviewed this encounter and updated as appropriate: medications, allergies, medical history  Review of Systems:  No other skin or systemic complaints except as noted in HPI or Assessment and Plan.  Objective  Well appearing patient in no apparent distress; mood and affect are within normal limits.  A full examination was performed including scalp, head, eyes, ears, nose, lips, neck, chest, axillae, abdomen, back, buttocks, bilateral upper extremities, bilateral lower extremities, hands, feet, fingers, toes, fingernails, and toenails. All findings within normal limits unless otherwise noted below.   Relevant physical exam findings are noted in the Assessment and Plan.  scalp and face x 12, left chest x 1 (13) Erythematous thin papules/macules with gritty scale.  right wrist x 2, right chest x 1 (3) Erythematous stuck-on, waxy papule or plaque  Assessment & Plan   SKIN CANCER SCREENING PERFORMED TODAY.  ACTINIC DAMAGE - Chronic condition, secondary to cumulative UV/sun exposure - diffuse scaly erythematous macules with underlying dyspigmentation - Recommend daily broad spectrum sunscreen SPF 30+ to sun-exposed areas, reapply every 2 hours as needed.  - Staying in the shade or wearing long sleeves, sun glasses (UVA+UVB protection) and wide brim hats (4-inch brim around the entire circumference of the hat) are also recommended for sun protection.  - Call for new or changing lesions.  LENTIGINES, SEBORRHEIC KERATOSES,  HEMANGIOMAS - Benign normal skin lesions - Benign-appearing - Call for any changes  MELANOCYTIC NEVI - Tan-brown and/or pink-flesh-colored symmetric macules and papules - Benign appearing on exam today - Observation - Call clinic for new or changing moles - Recommend daily use of broad spectrum spf 30+ sunscreen to sun-exposed areas.   HISTORY OF BASAL CELL CARCINOMA OF THE SKIN 04/13/2008 - right temple 03/08/2009 - recurrent  03/29/2014 - right paraspinal upper back - superficial  11/28/2015 - right temple hairline - nodular pattern 10/07/2019 right chest   - No evidence of recurrence today - Recommend regular full body skin exams - Recommend daily broad spectrum sunscreen SPF 30+ to sun-exposed areas, reapply every 2 hours as needed.  - Call if any new or changing lesions are noted between office visits   HISTORY OF SQUAMOUS CELL CARCINOMA OF THE SKIN 05/24/2021 - left chest - ED&C - No evidence of recurrence today - No lymphadenopathy - Recommend regular full body skin exams - Recommend daily broad spectrum sunscreen SPF 30+ to sun-exposed areas, reapply every 2 hours as needed.  - Call if any new or changing lesions are noted between office visits  ACTINIC KERATOSIS (13) scalp and face x 12, left chest x 1 (13) Actinic keratoses are precancerous spots that appear secondary to cumulative UV radiation exposure/sun exposure over time. They are chronic with expected duration over 1 year. A portion of actinic keratoses will progress to squamous cell carcinoma of the skin. It is not possible to reliably predict which spots will progress to skin cancer and so treatment is recommended to prevent development of skin cancer.  Recommend daily broad spectrum sunscreen  SPF 30+ to sun-exposed areas, reapply every 2 hours as needed.  Recommend staying in the shade or wearing long sleeves, sun glasses (UVA+UVB protection) and wide brim hats (4-inch brim around the entire circumference of the  hat). Call for new or changing lesions. Destruction of lesion - scalp and face x 12, left chest x 1 (13) Complexity: simple   Destruction method: cryotherapy   Informed consent: discussed and consent obtained   Timeout:  patient name, date of birth, surgical site, and procedure verified Lesion destroyed using liquid nitrogen: Yes   Region frozen until ice ball extended beyond lesion: Yes   Outcome: patient tolerated procedure well with no complications   Post-procedure details: wound care instructions given   INFLAMED SEBORRHEIC KERATOSIS (3) right wrist x 2, right chest x 1 (3) Symptomatic, irritating, patient would like treated. Destruction of lesion - right wrist x 2, right chest x 1 (3) Complexity: simple   Destruction method: cryotherapy   Informed consent: discussed and consent obtained   Timeout:  patient name, date of birth, surgical site, and procedure verified Lesion destroyed using liquid nitrogen: Yes   Region frozen until ice ball extended beyond lesion: Yes   Outcome: patient tolerated procedure well with no complications   Post-procedure details: wound care instructions given   Return in about 6 months (around 05/09/2024) for ak and isk follow up.  IRandee Busing, CMA, am acting as scribe for Celine Collard, MD.   Documentation: I have reviewed the above documentation for accuracy and completeness, and I agree with the above.  Celine Collard, MD

## 2023-11-07 NOTE — Patient Instructions (Addendum)

## 2023-11-11 ENCOUNTER — Encounter: Payer: Self-pay | Admitting: Dermatology

## 2023-11-14 ENCOUNTER — Encounter: Payer: Medicare Other | Admitting: Physical Therapy

## 2023-11-21 ENCOUNTER — Encounter: Payer: Medicare Other | Admitting: Physical Therapy

## 2023-11-21 ENCOUNTER — Ambulatory Visit (INDEPENDENT_AMBULATORY_CARE_PROVIDER_SITE_OTHER): Payer: Medicare Other | Admitting: Adult Health

## 2023-11-21 VITALS — BP 142/79 | HR 78

## 2023-11-21 DIAGNOSIS — G43709 Chronic migraine without aura, not intractable, without status migrainosus: Secondary | ICD-10-CM

## 2023-11-21 MED ORDER — ONABOTULINUMTOXINA 200 UNITS IJ SOLR
155.0000 [IU] | Freq: Once | INTRAMUSCULAR | Status: AC
  Administered 2023-11-21: 155 [IU] via INTRAMUSCULAR

## 2023-11-21 NOTE — Progress Notes (Signed)
 Update 11/21/2023 Brianna Calhoun: Returns for repeat Botox .  Prior Botox  08/15/2023 with Dr. Tresia Fruit.  Reports continued benefit with Botox .  Can have a couple headaches per week but not migrainous, usually about 4 migraines per month, use of rizatriptan  with benefit.  She does have increased neck stiffness/tension on right side which is the side of her migraines.  Tolerated procedure well.  Return in 3 months for repeat injection.       Consent Form Botulism Toxin Injection For Chronic Migraine    Reviewed orally with patient, additionally signature is on file:  Botulism toxin has been approved by the Federal drug administration for treatment of chronic migraine. Botulism toxin does not cure chronic migraine and it may not be effective in some patients.  The administration of botulism toxin is accomplished by injecting a small amount of toxin into the muscles of the neck and head. Dosage must be titrated for each individual. Any benefits resulting from botulism toxin tend to wear off after 3 months with a repeat injection required if benefit is to be maintained. Injections are usually done every 3-4 months with maximum effect peak achieved by about 2 or 3 weeks. Botulism toxin is expensive and you should be sure of what costs you will incur resulting from the injection.  The side effects of botulism toxin use for chronic migraine may include:   -Transient, and usually mild, facial weakness with facial injections  -Transient, and usually mild, head or neck weakness with head/neck injections  -Reduction or loss of forehead facial animation due to forehead muscle weakness  -Eyelid drooping  -Dry eye  -Pain at the site of injection or bruising at the site of injection  -Double vision  -Potential unknown long term risks   Contraindications: You should not have Botox  if you are pregnant, nursing, allergic to albumin, have an infection, skin condition, or muscle weakness at the site of the injection,  or have myasthenia gravis, Lambert-Eaton syndrome, or ALS.  It is also possible that as with any injection, there may be an allergic reaction or no effect from the medication. Reduced effectiveness after repeated injections is sometimes seen and rarely infection at the injection site may occur. All care will be taken to prevent these side effects. If therapy is given over a long time, atrophy and wasting in the muscle injected may occur. Occasionally the patient's become refractory to treatment because they develop antibodies to the toxin. In this event, therapy needs to be modified.  I have read the above information and consent to the administration of botulism toxin.    BOTOX  PROCEDURE NOTE FOR MIGRAINE HEADACHE  Contraindications and precautions discussed with patient(above). Aseptic procedure was observed and patient tolerated procedure. Procedure performed by Johny Nap, AGNP-BC.   The condition has existed for more than 6 months, and pt does not have a diagnosis of ALS, Myasthenia Gravis or Lambert-Eaton Syndrome.  Risks and benefits of injections discussed and pt agrees to proceed with the procedure.  Written consent obtained  These injections are medically necessary. Pt  receives good benefits from these injections. These injections do not cause sedations or hallucinations which the oral therapies may cause.   Description of procedure:  The patient was placed in a sitting position. The standard protocol was used for Botox  as follows, with 5 units of Botox  injected at each site:  -Procerus muscle, midline injection  -Corrugator muscle, bilateral injection  -Frontalis muscle, bilateral injection, with 2 sites each side, medial injection was  performed in the upper one third of the frontalis muscle, in the region vertical from the medial inferior edge of the superior orbital rim. The lateral injection was again in the upper one third of the forehead vertically above the lateral  limbus of the cornea, 1.5 cm lateral to the medial injection site.  -Temporalis muscle injection, 4 sites, bilaterally. The first injection was 3 cm above the tragus of the ear, second injection site was 1.5 cm to 3 cm up from the first injection site in line with the tragus of the ear. The third injection site was 1.5-3 cm forward between the first 2 injection sites. The fourth injection site was 1.5 cm posterior to the second injection site. 5th site laterally in the temporalis  muscleat the level of the outer canthus.  -Occipitalis muscle injection, 3 sites, bilaterally. The first injection was done one half way between the occipital protuberance and the tip of the mastoid process behind the ear. The second injection site was done lateral and superior to the first, 1 fingerbreadth from the first injection. The third injection site was 1 fingerbreadth superiorly and medially from the first injection site.  -Cervical paraspinal muscle injection, 2 sites, bilaterally. The first injection site was 1 cm from the midline of the cervical spine, 3 cm inferior to the lower border of the occipital protuberance. The second injection site was 1.5 cm superiorly and laterally to the first injection site.  -Trapezius muscle injection was performed at 3 sites, bilaterally. The first injection site was in the upper trapezius muscle halfway between the inflection point of the neck, and the acromion. The second injection site was one half way between the acromion and the first injection site. The third injection was done between the first injection site and the inflection point of the neck.    A total of 200 units of Botox  was prepared, 155 units of Botox  was injected as documented above, any Botox  not injected was wasted. The patient tolerated the procedure well, there were no complications of the above procedure.   Johny Nap, AGNP-BC  [facility] [address] [place],  [place] [address]  Phone [phone] Fax [phone] Note: This document was prepared with digital dictation and possible smart phrase technology. Any transcriptional errors that result from this process are unintentional.

## 2023-11-21 NOTE — Progress Notes (Signed)
 Botox - 200 units x 1 vial Lot: [id number] Expiration: 2027/10 NDC: 0023-3921-02   Bacteriostatic 0.9% Sodium Chloride - 4 mL  Lot: [id number] Expiration: 05/09/24 NDC: 191478295   Dx: A21.308 B/B Witnessed by:Tori

## 2023-12-12 ENCOUNTER — Encounter: Payer: Medicare Other | Admitting: Physical Therapy

## 2023-12-19 ENCOUNTER — Encounter: Payer: Medicare Other | Admitting: Physical Therapy

## 2023-12-25 ENCOUNTER — Encounter: Payer: Medicare Other | Admitting: Physical Therapy

## 2024-01-02 ENCOUNTER — Encounter: Payer: Medicare Other | Admitting: Physical Therapy

## 2024-01-09 ENCOUNTER — Encounter: Payer: Medicare Other | Admitting: Physical Therapy

## 2024-01-15 ENCOUNTER — Telehealth: Payer: Self-pay | Admitting: Neurology

## 2024-01-15 NOTE — Telephone Encounter (Signed)
 Pt's BCBS supplement is inactive. Sent MyChart msg asking if she has a new plan or will only file Medicare.

## 2024-02-13 ENCOUNTER — Ambulatory Visit (INDEPENDENT_AMBULATORY_CARE_PROVIDER_SITE_OTHER): Admitting: Adult Health

## 2024-02-13 VITALS — BP 132/67 | HR 75

## 2024-02-13 DIAGNOSIS — G43709 Chronic migraine without aura, not intractable, without status migrainosus: Secondary | ICD-10-CM | POA: Diagnosis not present

## 2024-02-13 DIAGNOSIS — G43009 Migraine without aura, not intractable, without status migrainosus: Secondary | ICD-10-CM

## 2024-02-13 MED ORDER — ONABOTULINUMTOXINA 200 UNITS IJ SOLR
155.0000 [IU] | Freq: Once | INTRAMUSCULAR | Status: AC
  Administered 2024-02-13: 155 [IU] via INTRAMUSCULAR

## 2024-02-13 NOTE — Progress Notes (Signed)
 Update 11/21/2023 JM: Returns for repeat Botox .  Prior Botox  11/21/2023.  Reports continued benefit with Botox . Did have a migraine a couple weeks ago but overall still very well controlled. Use of rizatriptan  with benefit. Tolerated procedure well.  Return in 3 months for repeat injection.       Consent Form Botulism Toxin Injection For Chronic Migraine    Reviewed orally with patient, additionally signature is on file:  Botulism toxin has been approved by the Federal drug administration for treatment of chronic migraine. Botulism toxin does not cure chronic migraine and it may not be effective in some patients.  The administration of botulism toxin is accomplished by injecting a small amount of toxin into the muscles of the neck and head. Dosage must be titrated for each individual. Any benefits resulting from botulism toxin tend to wear off after 3 months with a repeat injection required if benefit is to be maintained. Injections are usually done every 3-4 months with maximum effect peak achieved by about 2 or 3 weeks. Botulism toxin is expensive and you should be sure of what costs you will incur resulting from the injection.  The side effects of botulism toxin use for chronic migraine may include:   -Transient, and usually mild, facial weakness with facial injections  -Transient, and usually mild, head or neck weakness with head/neck injections  -Reduction or loss of forehead facial animation due to forehead muscle weakness  -Eyelid drooping  -Dry eye  -Pain at the site of injection or bruising at the site of injection  -Double vision  -Potential unknown long term risks   Contraindications: You should not have Botox  if you are pregnant, nursing, allergic to albumin, have an infection, skin condition, or muscle weakness at the site of the injection, or have myasthenia gravis, Lambert-Eaton syndrome, or ALS.  It is also possible that as with any injection, there may be an  allergic reaction or no effect from the medication. Reduced effectiveness after repeated injections is sometimes seen and rarely infection at the injection site may occur. All care will be taken to prevent these side effects. If therapy is given over a long time, atrophy and wasting in the muscle injected may occur. Occasionally the patient's become refractory to treatment because they develop antibodies to the toxin. In this event, therapy needs to be modified.  I have read the above information and consent to the administration of botulism toxin.    BOTOX  PROCEDURE NOTE FOR MIGRAINE HEADACHE  Contraindications and precautions discussed with patient(above). Aseptic procedure was observed and patient tolerated procedure. Procedure performed by Harlene Bogaert, AGNP-BC.   The condition has existed for more than 6 months, and pt does not have a diagnosis of ALS, Myasthenia Gravis or Lambert-Eaton Syndrome.  Risks and benefits of injections discussed and pt agrees to proceed with the procedure.  Written consent obtained  These injections are medically necessary. Pt  receives good benefits from these injections. These injections do not cause sedations or hallucinations which the oral therapies may cause.   Description of procedure:  The patient was placed in a sitting position. The standard protocol was used for Botox  as follows, with 5 units of Botox  injected at each site:  -Procerus muscle, midline injection  -Corrugator muscle, bilateral injection  -Frontalis muscle, bilateral injection, with 2 sites each side, medial injection was performed in the upper one third of the frontalis muscle, in the region vertical from the medial inferior edge of the superior orbital rim.  The lateral injection was again in the upper one third of the forehead vertically above the lateral limbus of the cornea, 1.5 cm lateral to the medial injection site.  -Temporalis muscle injection, 4 sites, bilaterally. The first  injection was 3 cm above the tragus of the ear, second injection site was 1.5 cm to 3 cm up from the first injection site in line with the tragus of the ear. The third injection site was 1.5-3 cm forward between the first 2 injection sites. The fourth injection site was 1.5 cm posterior to the second injection site. 5th site laterally in the temporalis  muscleat the level of the outer canthus.  -Occipitalis muscle injection, 3 sites, bilaterally. The first injection was done one half way between the occipital protuberance and the tip of the mastoid process behind the ear. The second injection site was done lateral and superior to the first, 1 fingerbreadth from the first injection. The third injection site was 1 fingerbreadth superiorly and medially from the first injection site.  -Cervical paraspinal muscle injection, 2 sites, bilaterally. The first injection site was 1 cm from the midline of the cervical spine, 3 cm inferior to the lower border of the occipital protuberance. The second injection site was 1.5 cm superiorly and laterally to the first injection site.  -Trapezius muscle injection was performed at 3 sites, bilaterally. The first injection site was in the upper trapezius muscle halfway between the inflection point of the neck, and the acromion. The second injection site was one half way between the acromion and the first injection site. The third injection was done between the first injection site and the inflection point of the neck.    A total of 200 units of Botox  was prepared, 155 units of Botox  was injected as documented above, any Botox  not injected was wasted. The patient tolerated the procedure well, there were no complications of the above procedure.   Harlene Bogaert, AGNP-BC  [facility] [address] [place], [place] [address]  Phone [phone] Fax [phone] Note: This document was prepared with digital dictation and possible smart  phrase technology. Any transcriptional errors that result from this process are unintentional.

## 2024-02-13 NOTE — Progress Notes (Signed)
 Botox - 200 units x 1 vial Lot: [id number] Expiration: 2027/11 NDC: 0023-3921-02   Bacteriostatic 0.9% Sodium Chloride - 4 mL  Lot: [id number] Expiration: 05/08/25 NDC: [id number]   Dx: H56.290 B/B Witnessed by: Augustin NOVAK

## 2024-02-24 ENCOUNTER — Encounter: Payer: Self-pay | Admitting: Physical Therapy

## 2024-02-24 ENCOUNTER — Ambulatory Visit: Attending: Obstetrics and Gynecology | Admitting: Physical Therapy

## 2024-02-24 DIAGNOSIS — M533 Sacrococcygeal disorders, not elsewhere classified: Secondary | ICD-10-CM | POA: Diagnosis present

## 2024-02-24 DIAGNOSIS — G8929 Other chronic pain: Secondary | ICD-10-CM | POA: Diagnosis present

## 2024-02-24 DIAGNOSIS — M545 Low back pain, unspecified: Secondary | ICD-10-CM | POA: Diagnosis present

## 2024-02-24 NOTE — Therapy (Addendum)
 OUTPATIENT PHYSICAL THERAPY EVALUATION   Patient Name: Brianna Calhoun MRN: [id number] DOB:13-Nov-1955, 68 y.o., female Today's Date: 02/24/2024   PT End of Session - 02/24/24 0857     Visit Number 1    Number of Visits 10    Date for PT Re-Evaluation 05/04/24    PT Start Time 0853    PT Stop Time 0935    PT Time Calculation (min) 42 min    Activity Tolerance Patient tolerated treatment well;No increased pain    Behavior During Therapy [facility] for tasks assessed/performed          Past Medical History:  Diagnosis Date   Actinic keratosis    Basal cell carcinoma 04/13/2008   Right temple   Basal cell carcinoma 10/07/2019   Right chest   Basal cell carcinoma (BCC) of face 03/08/2009   Right temple, recurrent   Basal cell carcinoma (BCC) of face 11/28/2015   Right temple, hairline. Nodular pattern   Basal cell carcinoma of trunk 03/29/2014   Right paraspinal upper back. Superficial    Cancer (HCC)    skin   GERD (gastroesophageal reflux disease)    Hyperlipidemia    Migraine    Squamous cell carcinoma of skin 05/24/2021   Left chest [facility]   Past Surgical History:  Procedure Laterality Date   CERVICAL CERCLAGE     6/84, 1/90, 8/93 under epidurals   CESAREAN SECTION  1984, 1986, 1990, 1994   CHOLECYSTECTOMY  1994   COLONOSCOPY N/A 12/13/2014   Procedure: COLONOSCOPY;  Surgeon: Lamar ONEIDA Holmes, MD;  Location: [facility] ENDOSCOPY;  Service: Endoscopy;  Laterality: N/A;   EYE SURGERY  1998   HERNIA REPAIR  1996   left hernia repair   LEFT OOPHORECTOMY Left 2005   multiple complex cysts   llq incisional hernia repair & mini-tummy tuck  07/1994   nasal surgeries     9/87, 9/00, 11/04; deviated septum, ethmoidectomy, rhinoplasty, rhinoplasty revision   Patient Active Problem List   Diagnosis Date Noted   Elevated hemoglobin (HCC) 10/10/2022   Osteopenia after menopause 10/04/2021   Elevated blood pressure reading in office with white coat syndrome, without diagnosis of  hypertension 10/21/2020   Perineal irritation in female 01/17/2020   Hyperplastic polyp of large intestine 01/15/2020   Diverticulosis of colon 01/15/2020   Internal hemorrhoid, bleeding 01/15/2020   Chronic migraine without aura without status migrainosus, not intractable 09/21/2019   History of migraine headaches 07/30/2019   Educated about COVID-19 virus infection 01/16/2019   Statin intolerance 08/08/2017   Encounter for preventive health examination 06/03/2017   Hyperlipidemia LDL goal <130 10/16/2016   Anxiety state 10/16/2016   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 Pt has to use her finger to press perineum on the R side .  Then this Sx got better and started doing the past Pelvic PT exericses with deep core  and clamshells  It got better and no longer an issue at the moment .  Daily movements without straining . But when she goes on vacation, she gets constipation.    2) non-radiating  R LBP, hip pain -  Pt feels better and has no pain when she is walking or pickleball playing. When she is not active: pain is 7/10. Easing factors: stretches, massaging.  Pt stopped wearing shoe lift for her R shorter leg in April 2025 when it got how and she shifted to wearing a sandal without a back strap when leaving the house or went barefoot in the house.    Pt plays pickleball across 4 x a time. Pt has not been doing  complimentary stretches to pickleball playing as advised during her last PT sessions.     Pt walks 3 days with 3 miles. Pt does some stretches after playing pickleball but not stretches after walking.    Pt also holds her 20 lb granddtr on her L hip often .      3) tight hamstrings in both legs. 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 Yes: see above   PRECAUTIONS: None  WEIGHT BEARING RESTRICTIONS: No   FALLS:  Has patient fallen in last 6 months? 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  One is an 22 month old weighs 20 lb which she has to carry    PLOF: Independent    OBJECTIVE:    [facility] PT Assessment - 02/24/24 0922       Strength   Overall Strength Comments knee flex/ext 3/5, R 4/5  , B hip flex 4/5 , R hip abd 4-/5, L 4+/5      Palpation   SI assessment  L shoulder/ L iliac crest lowered with shoe lift in R shoe,   Levelled shoulders/ pelvis without shoe lift in R shoe)  , Thorax shifted to the R,... Supine: R ASIS/ R low rib lowered,      Ambulation/Gait   Gait Comments 1.16 m/s without R shoe lift, minimal trunk rotation, R thorax posterior rotation,  with shoe lift in R shoe,  0.96 m/s with shoe lift with minimal trunk rotation , R thorax posterior rotation            [facility] Adult PT Treatment/Exercise - 02/24/24 1009       Ambulation/Gait   Gait Comments 1.16 m/s without R shoe lift, minimal trunk rotation, R thorax posterior rotation,  with shoe lift in R shoe,  0.96 m/s with shoe lift with minimal trunk rotation , R thorax posterior rotation      Therapeutic Activites    Therapeutic Activities Other Therapeutic Activities    Other Therapeutic Activities  explained upcoming sessions to help complliment repeated rotation in pickleball playing/ holding her granddtr on her L hip  Neuro Re-ed    Neuro Re-ed Details  cued for stretches that compliment R thoracic rotation with pickleball playing to realign her spine               HOME EXERCISE PROGRAM: See pt instruction section    ASSESSMENT:  CLINICAL IMPRESSION:  Pt is a  68 yo  who presents with  following issues which impact QOL, ADL,  fitness, social and community activities:    1) perineal bulge   2) non-radiating  R LBP, hip pain   3) tight hamstrings in both legs.    Pt's musculoskeletal assessment revealed asymmetries to gait pattern, limited spinal /pelvic mobility, dyscoordination and strength of pelvic floor mm, hip weakness, poor body mechanics which places strain on the abdominal/pelvic floor mm. These are deficits are associated with increased risk for pt's Sx.     Regional interdependent approaches will yield greater benefits in pt's POC.  Following Tx today which pt tolerated without complaints,  pt demo'd  complimentary stretches to pickleball playing to minimize overuse of R upper body twisting with racket ( pt is R handed).  Plan to address realignment of spine/ pelvis at next session to help promote optimize IAP system for improved pelvic floor function, trunk stability, gait, balance, stabilization with mobility tasks.                                                        Pt benefits from skilled PT.    OBJECTIVE IMPAIRMENTS decreased activity tolerance, decreased coordination, decreased endurance, decreased mobility, difficulty walking, decreased ROM, decreased strength, decreased safety awareness, hypomobility, increased muscle spasms, impaired flexibility, improper body mechanics, postural dysfunction, and pain.   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 Explained muscles attachments/ connection, physiology of deep core system/ spinal- thoracic-pelvis-lower kinetic chain as they relate to pt's presentation, Sx, and past Hx. Explained what and how these areas of deficits need to be restored to balance and function    See Therapeutic activity / neuromuscular re-education section  Answered pt's questions.   Person educated: Patient Education method: Explanation, Demonstration, Tactile cues, Verbal cues, and Handouts Education comprehension: verbalized understanding, returned demonstration, verbal cues required, tactile cues required, and needs further education     PLAN: PT FREQUENCY: 1x/week   PT DURATION: 10 weeks   PLANNED INTERVENTIONS:   Gait training;Stair training;Functional mobility training;DME Instruction;Therapeutic activities;Therapeutic exercise;Balance training;Neuromuscular re-education;Patient/family education;Vestibular;Visual/perceptual remediation/compensation;Passive range of motion;Moist Heat;Cryotherapy;Traction;Canalith Repostioning;Joint Manipulations;Manual lymph drainage;Manual techniques;Scar mobilization;Energy conservation;Dry needling;ADLs/Self Care Home Management;Biofeedback;Electrical Stimulation;Taping    PLAN FOR NEXT SESSION: See clinical impression for plan     GOALS: Goals reviewed with patient? Yes  SHORT TERM GOALS: Target date: 03/23/2024    Pt will demo IND with HEP                    Baseline: Not IND            Goal status: INITIAL   LONG TERM GOALS: Target date: 05/04/2024  1.Pt will demo proper deep core coordination without chest breathing and optimal excursion of diaphragm/pelvic floor in order to promote spinal  stability and pelvic floor function  Baseline: dyscoordination Goal status: INITIAL  2.  Pt will demo proper body mechanics in against gravity tasks and ADLs  work tasks, fitness  to minimize straining pelvic floor / back    Baseline: not IND, improper form that places strain on pelvic floor  Goal status: INITIAL    3. Pt will demo increased gait speed > 1.3 m/s with reciprocal gait pattern, longer stride length  in order to ambulate safely in community and return to fitness routine  Baseline:  1.16 m/s without R shoe lift, minimal trunk rotation, R thorax posterior rotation,  with shoe lift in R shoe,  0.96 m/s with shoe lift with minimal trunk rotation , R thorax posterior rotation   Goal status: INITIAL    4. Pt will demo increased hip and knee strength> 4/5 bilaterally  in order continue playing pickleball and walking with less relapse of pain Baseline: knee flex/ext 3/5, R 4/5  , B hip flex 4/5 , R hip abd 4-/5, L 4+/5  Goal status: INITIAL   5. Pt will improve ODI  questionnaire to  pts  score change  to demo improved QOL  Baseline:    (Decreased  pts indicate greater  impact on QOL)     pts  ( total)  Goal status: INITIAL  6. Pt will be IND with flexibility routine to improve hamstring flexibility and to compliment pickleball and walking  Baseline: no flexibility routine after walking and pickleball Goal status: INITIAL   7.  Pt will report decreased R LBP/ hip pain < 3/10 when she is not active in order to QOL   Baseline:  R LBP/ R hip   pain:     When she is not active: pain is 7/10. Easing factors: stretches, massaging.  Goal status: INITIAL   Pia Lupe Plump, PT 02/24/2024, 8:59 AM

## 2024-02-24 NOTE — Patient Instructions (Addendum)
 Viakiaxx sandals with back straps   __  AFTER AND BEFORE PICKLEBALL PLAYING TO COMPLIMENT THE OVERUSE OF R ARM TURNS  kitchen counter stretches  Hands on kitchen counter,   Palms shoulder width apart  Minisquat postion Trunk is parallel to floor  A) Pull buttocks back to lengthen spine, knees bent  3 breaths   B) Bring R hand to the L, and stretch the R side trunk  3 breaths     C)  R hand on the R hip, elbow back and look up to the sky  10 reps  ____ Hold off on shoe lift wearing

## 2024-03-02 ENCOUNTER — Ambulatory Visit: Admitting: Physical Therapy

## 2024-03-02 DIAGNOSIS — M545 Low back pain, unspecified: Secondary | ICD-10-CM

## 2024-03-02 DIAGNOSIS — M533 Sacrococcygeal disorders, not elsewhere classified: Secondary | ICD-10-CM

## 2024-03-02 NOTE — Patient Instructions (Signed)
 Stretches :   Neck / shoulder stretches:    Lying on back - small sushi roll towel under neck  _ 6 directions   Chin up, down Rotation like changing lanes when driving Ear to shoulder like puppy dog   10 reps    _angel wings, lower elbows down , keep arms touching bed  10 reps    ____________  ZigZag stretch  - One side only   Reclined twist for hips and side of the hips/ legs  Lay on your back, knees bend Scoot hips to the L , leave shoulders in place Wobble knees to the L side 45 deg and to midline  10 reps     ___________   serafina stretch Clock stretch with head turns :  stand perpendicular to the wall, L side to the wall Tilt head to wall  Place L palm at 7 o clock Chin tuck like you are looking into armpit Look at [place]  on giant map  Swivel head with chin tucked to look in upper corner of ceiling as if you are look at  [place] on giant map    5 reps   Switch direction, R palm on wall at 5 o 'clock   Chin tuck like you are looking into armpit Look at [facility]  on giant map  Swivel head with chin tucked to look in upper corner of ceiling as if you are look at  [facility] on giant map    5 reps    ___

## 2024-03-02 NOTE — Therapy (Signed)
 OUTPATIENT PHYSICAL THERAPY  TREATMENT   Patient Name: Brianna Calhoun MRN: [id number] DOB:08/30/1955, 68 y.o., female Today's Date: 03/02/2024   PT End of Session - 03/02/24 0853     Visit Number 2    Number of Visits 10    Date for PT Re-Evaluation 05/04/24    PT Start Time 0850    PT Stop Time 0930    PT Time Calculation (min) 40 min    Activity Tolerance Patient tolerated treatment well;No increased pain    Behavior During Therapy [facility] for tasks assessed/performed          Past Medical History:  Diagnosis Date   Actinic keratosis    Basal cell carcinoma 04/13/2008   Right temple   Basal cell carcinoma 10/07/2019   Right chest   Basal cell carcinoma (BCC) of face 03/08/2009   Right temple, recurrent   Basal cell carcinoma (BCC) of face 11/28/2015   Right temple, hairline. Nodular pattern   Basal cell carcinoma of trunk 03/29/2014   Right paraspinal upper back. Superficial    Cancer (HCC)    skin   GERD (gastroesophageal reflux disease)    Hyperlipidemia    Migraine    Squamous cell carcinoma of skin 05/24/2021   Left chest [facility]   Past Surgical History:  Procedure Laterality Date   CERVICAL CERCLAGE     6/84, 1/90, 8/93 under epidurals   CESAREAN SECTION  1984, 1986, 1990, 1994   CHOLECYSTECTOMY  1994   COLONOSCOPY N/A 12/13/2014   Procedure: COLONOSCOPY;  Surgeon: Lamar ONEIDA Holmes, MD;  Location: [facility] ENDOSCOPY;  Service: Endoscopy;  Laterality: N/A;   EYE SURGERY  1998   HERNIA REPAIR  1996   left hernia repair   LEFT OOPHORECTOMY Left 2005   multiple complex cysts   llq incisional hernia repair & mini-tummy tuck  07/1994   nasal surgeries     9/87, 9/00, 11/04; deviated septum, ethmoidectomy, rhinoplasty, rhinoplasty revision   Patient Active Problem List   Diagnosis Date Noted   Elevated hemoglobin (HCC) 10/10/2022   Osteopenia after menopause 10/04/2021   Elevated blood pressure reading in office with white coat syndrome, without diagnosis of  hypertension 10/21/2020   Perineal irritation in female 01/17/2020   Hyperplastic polyp of large intestine 01/15/2020   Diverticulosis of colon 01/15/2020   Internal hemorrhoid, bleeding 01/15/2020   Chronic migraine without aura without status migrainosus, not intractable 09/21/2019   History of migraine headaches 07/30/2019   Educated about COVID-19 virus infection 01/16/2019   Statin intolerance 08/08/2017   Encounter for preventive health examination 06/03/2017   Hyperlipidemia LDL goal <130 10/16/2016   Anxiety state 10/16/2016   Allergy to iodinated contrast media 10/16/2016    PCP: Marylynn   REFERRING PROVIDER: Verdon MART DIAG:   Rationale for Evaluation and Treatment Rehabilitation  THERAPY DIAG:  Chronic right-sided low back pain without sciatica  Sacrococcygeal disorders, not elsewhere classified    ONSET DATE:   SUBJECTIVE:               SUBJECTIVE STATEMENT TODAY:   Pt reported she felt no back pain after removing the shoe lift for a few days until she had to sleep in a recliner at the hospital for family member and also after 3 hours of pickleball playing.   Pt today feels tightness along the R lower rib to the PSIS .  Pt didnot get to do the PT stretches as much after pickleball playing  SUBJECTIVE STATEMENT ON EVAL  02/24/24: 1) In 04/2023 , pt noticed a perineal bulge when having not having a bowel movements. Pt has to use her finger to press perineum on the R side .  Then this Sx got better and started doing the past Pelvic PT exericses with deep core  and clamshells  It got better and no longer an issue at the moment .  Daily movements without straining . But when she goes on vacation, she gets constipation.    2) non-radiating  R LBP, hip pain -  Pt feels better and has no  pain when she is walking or pickleball playing. When she is not active: pain is 7/10. Easing factors: stretches, massaging.  Pt stopped wearing shoe lift for her R shorter leg in April 2025 when it got how and she shifted to wearing a sandal without a back strap when leaving the house or went barefoot in the house.    Pt plays pickleball across 4 x a time. Pt has not been doing complimentary stretches to pickleball playing as advised during her last PT sessions.     Pt walks 3 days with 3 miles. Pt does some stretches after playing pickleball but not stretches after walking.    Pt also holds her 20 lb granddtr on her L hip often .      3) tight hamstrings in both legs. 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 Yes: see above   PRECAUTIONS: None  WEIGHT BEARING RESTRICTIONS: No   FALLS:  Has patient fallen in last 6 months? 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  One is an 12 month old weighs 20 lb which she has to carry    PLOF: Independent    OBJECTIVE:    [facility] PT Assessment - 03/02/24 0854       Palpation   Spinal mobility tightness / hypomobile at R SIJ from PSIS to coccyx, paraspinals from lumbar to cervical,  cervical mm attahcments to occiput, mm at low ribs R posterior    SI assessment  L shoulder/ L iliac crest higher  standing,  seeated:  L shoulder lowered, R iliac crest higer    Supine: leg length:  84 cm B medial malleoli to ASIS       [facility] Adult PT Treatment/Exercise - 03/02/24 1003       Therapeutic Activites    Other Therapeutic Activities instructed and explaiend the importance of stretches before and after pickleball playing to address torque movements and  minimizing R thoracic rotation /shortening of lumbar      Neuro Re-ed    Neuro Re-ed Details  cued for neck bilateral and ipsilateral  lumbar stretches for tehcnique and propioceptio to address asymmetries of spine ,               HOME EXERCISE PROGRAM: See pt instruction section    ASSESSMENT:  CLINICAL IMPRESSION:     Following Tx today which pt tolerated without complaints,  pt demo'd more mobility at cervico/thoraicic/ SIJ on the R side.   Provided cues for technique and alignment / proprioception for  complimentary stretches to pickleball playing to minimize overuse of R upper body twisting with racket ( pt is R handed).   Pt reported feeling less tightness at R lumbar/PSIS area post Tx  Plan to continue to  address realignment of spine/ pelvis at next session to help promote optimize IAP system for improved pelvic floor function, trunk stability, gait, balance, stabilization with mobility tasks.                                                     Regional interdependent approaches will yield greater benefits in pt's POC.                                                     Pt benefits from skilled PT.    OBJECTIVE IMPAIRMENTS decreased activity tolerance, decreased coordination, decreased endurance, decreased mobility, difficulty walking, decreased ROM, decreased strength, decreased safety awareness, hypomobility, increased muscle spasms, impaired flexibility, improper body mechanics, postural dysfunction, and pain.   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 Explained muscles attachments/ connection, physiology of  deep core system/ spinal- thoracic-pelvis-lower kinetic chain as they relate to pt's presentation, Sx, and past Hx. Explained what and how these areas of deficits need to be restored to balance and function    See Therapeutic activity / neuromuscular re-education section  Answered pt's questions.   Person educated: Patient Education method: Explanation, Demonstration, Tactile cues, Verbal cues, and Handouts Education comprehension: verbalized understanding, returned demonstration, verbal cues required, tactile cues required, and needs further education     PLAN: PT FREQUENCY: 1x/week   PT DURATION: 10 weeks   PLANNED INTERVENTIONS:   Gait training;Stair training;Functional mobility training;DME Instruction;Therapeutic activities;Therapeutic exercise;Balance training;Neuromuscular re-education;Patient/family education;Vestibular;Visual/perceptual remediation/compensation;Passive range of motion;Moist Heat;Cryotherapy;Traction;Canalith Repostioning;Joint Manipulations;Manual lymph drainage;Manual techniques;Scar mobilization;Energy conservation;Dry needling;ADLs/Self Care Home Management;Biofeedback;Electrical Stimulation;Taping    PLAN FOR NEXT SESSION: See clinical impression for plan     GOALS: Goals reviewed with patient? Yes  SHORT TERM GOALS: Target date: 03/23/2024    Pt will demo IND with HEP                    Baseline: Not IND            Goal status: INITIAL   LONG TERM GOALS: Target date: 05/04/2024    1.Pt will demo proper deep core coordination without chest breathing and optimal excursion of diaphragm/pelvic floor in order to promote spinal stability and pelvic floor function  Baseline: dyscoordination Goal status: INITIAL  2.  Pt will demo proper body mechanics in against gravity tasks and ADLs  work tasks, fitness  to minimize straining pelvic floor / back    Baseline: not IND, improper form that places strain on pelvic floor  Goal status: INITIAL    3.  Pt will demo increased gait speed > 1.3 m/s with reciprocal gait  pattern, longer stride length  in order to ambulate safely in community and return to fitness routine  Baseline:  1.16 m/s without R shoe lift, minimal trunk rotation, R thorax posterior rotation,  with shoe lift in R shoe,  0.96 m/s with shoe lift with minimal trunk rotation , R thorax posterior rotation   Goal status: INITIAL    4. Pt will demo increased hip and knee strength> 4/5 bilaterally  in order continue playing pickleball and walking with less relapse of pain Baseline: knee flex/ext 3/5, R 4/5  , B hip flex 4/5 , R hip abd 4-/5, L 4+/5  Goal status: INITIAL   5. Pt will improve ODI  questionnaire to  pts  score change  to demo improved QOL  Baseline:    (Decreased  pts indicate greater  impact on QOL)     pts  ( total)  Goal status: INITIAL  6. Pt will be IND with flexibility routine to improve hamstring flexibility and to compliment pickleball and walking  Baseline: no flexibility routine after walking and pickleball Goal status: INITIAL   7.  Pt will report decreased R LBP/ hip pain < 3/10 when she is not active in order to QOL   Baseline:  R LBP/ R hip   pain:     When she is not active: pain is 7/10. Easing factors: stretches, massaging.  Goal status: INITIAL   Pia Lupe Plump, PT 03/02/2024, 8:56 AM

## 2024-03-12 ENCOUNTER — Ambulatory Visit: Payer: Self-pay

## 2024-03-12 ENCOUNTER — Ambulatory Visit: Attending: Obstetrics and Gynecology | Admitting: Physical Therapy

## 2024-03-12 DIAGNOSIS — M545 Low back pain, unspecified: Secondary | ICD-10-CM | POA: Insufficient documentation

## 2024-03-12 DIAGNOSIS — M2170 Unequal limb length (acquired), unspecified site: Secondary | ICD-10-CM | POA: Insufficient documentation

## 2024-03-12 DIAGNOSIS — G8929 Other chronic pain: Secondary | ICD-10-CM | POA: Insufficient documentation

## 2024-03-12 DIAGNOSIS — M533 Sacrococcygeal disorders, not elsewhere classified: Secondary | ICD-10-CM | POA: Insufficient documentation

## 2024-03-12 NOTE — Telephone Encounter (Signed)
 First attempt to contact pt, no answer, LVM for call back to office. Placed in call back.   Copied from CRM [id number]. Topic: Clinical - Pink Word Triage >> Mar 12, 2024 10:47 AM Brianna Calhoun ORN wrote: Reason for CRM: Patient stated she is having a yeast infection and very itchy she wants to know can Dr.Tullo send a prescription over by name of diflucan . She doesn't want to come in.

## 2024-03-12 NOTE — Telephone Encounter (Deleted)
 Copied from CRM [id number]. Topic: Clinical - Pink Word Triage >> Mar 12, 2024 10:47 AM Gennette ORN wrote: Reason for CRM: Patient stated she is having a yeast infection and very itchy she wants to know can Dr.Tullo send a prescription over by name of diflucan . She doesn't want to come in. >> Mar 12, 2024  3:30 PM Alfonso ORN wrote: Patient received a call from triage nurse Damien today and returning the call

## 2024-03-12 NOTE — Telephone Encounter (Signed)
 LMTCB

## 2024-03-12 NOTE — Telephone Encounter (Signed)
Please disregard, erroneous encounter

## 2024-03-12 NOTE — Patient Instructions (Signed)
  ___ Lengthen Back rib by R  shoulder   ( winging)  Lie on L  side , pillow between knees and under head  Pull  R arm overhead over mattress, grab the edge of mattress,pull it upward, drawing elbow away from ears  Breathing 10 reps Brushing arm with 3/4 turn onto pillow behind back  Lying on L  side ,Pillow/ Block between knees  dragging top forearm across ribs below breast rotating 3/4 turn,  rotating  _R_ only this week ,  relax onto the pillow behind the back  and then back to other palm , maintain top palm on body whole top and not lift shoulder Do this side this week       Wait do both sides until we have levelled out your spine   __  Multifidis twist   Band is on doorknob: sit facing perpendicular to door , sit halfway towards front of chair, firm through 4 points of contact at buttocks and feet. Feet are placed hip with apart.   Twisting trunk without moving the hips and knees Hold band at the level of ribcage, elbows bent,shoulder blades roll back and down like squeezing a pencil under armpit TURNING TO LEFT  ONLY     Exhale twist,.10-15 deg away from door without moving your hips/ knees, press more weight on the side of the sitting bones/ foot opp of your direction of turn as your counterweight. Continue to maintain equal weight through legs.  Keep knee unlocked.  30 reps

## 2024-03-12 NOTE — Telephone Encounter (Signed)
 FYI Only or Action Required?: Action required by provider: wants medication called.  Patient was last seen in primary care on 09/25/2023 by Marylynn Verneita CROME, MD.  Called Nurse Triage reporting Vaginitis.  Symptoms began several days ago.  Interventions attempted: Nothing.  Symptoms are: gradually worsening.  Triage Disposition: See PCP When Office is Open (Within 3 Days)  Patient/caregiver understands and will follow disposition?: No, wishes to speak with PCP Reason for Disposition  Bad smelling vaginal discharge  Answer Assessment - Initial Assessment Questions 1. DISCHARGE: Describe the discharge. (e.g., white, yellow, green, gray, foamy, cottage cheese-like)     Clear but very itchy 2. ODOR: Is there a bad odor?     Little bit 3. ONSET: When did the discharge begin?     Three days 4. RASH: Is there a rash in the genital area? If Yes, ask: Describe it. (e.g., redness, blisters, sores, bumps)     no 5. ABDOMEN PAIN: Are you having any abdomen pain? If Yes, ask: What does it feel like?  (e.g., crampy, dull, intermittent, constant)      no 6. ABDOMEN PAIN SEVERITY: If present, ask: How bad is it? (e.g., Scale 1-10; mild, moderate, or severe)     no 7. CAUSE: What do you think is causing the discharge? Have you had the same problem before? What happened then?     Yeast infection 8. OTHER SYMPTOMS: Do you have any other symptoms? (e.g., fever, itching, urination pain, vaginal bleeding, vaginal foreign body)     Itching, burning externally when urinating.  9. PREGNANCY: Is there any chance you are pregnant? When was your last menstrual period?     na  Protocols used: Vaginal Discharge-A-AH

## 2024-03-12 NOTE — Therapy (Signed)
 OUTPATIENT PHYSICAL THERAPY  TREATMENT   Patient Name: Brianna Calhoun MRN: [id number] DOB:12/02/1955, 68 y.o., female Today's Date: 03/12/2024    PT End of Session - 03/12/24 1053     Visit Number 3    Number of Visits 10    Date for PT Re-Evaluation 05/04/24    PT Start Time 0850    PT Stop Time 0930    PT Time Calculation (min) 40 min    Activity Tolerance Patient tolerated treatment well;No increased pain    Behavior During Therapy [facility] for tasks assessed/performed           Past Medical History:  Diagnosis Date   Actinic keratosis    Basal cell carcinoma 04/13/2008   Right temple   Basal cell carcinoma 10/07/2019   Right chest   Basal cell carcinoma (BCC) of face 03/08/2009   Right temple, recurrent   Basal cell carcinoma (BCC) of face 11/28/2015   Right temple, hairline. Nodular pattern   Basal cell carcinoma of trunk 03/29/2014   Right paraspinal upper back. Superficial    Cancer (HCC)    skin   GERD (gastroesophageal reflux disease)    Hyperlipidemia    Migraine    Squamous cell carcinoma of skin 05/24/2021   Left chest [facility]   Past Surgical History:  Procedure Laterality Date   CERVICAL CERCLAGE     6/84, 1/90, 8/93 under epidurals   CESAREAN SECTION  1984, 1986, 1990, 1994   CHOLECYSTECTOMY  1994   COLONOSCOPY N/A 12/13/2014   Procedure: COLONOSCOPY;  Surgeon: Lamar ONEIDA Holmes, MD;  Location: [facility] ENDOSCOPY;  Service: Endoscopy;  Laterality: N/A;   EYE SURGERY  1998   HERNIA REPAIR  1996   left hernia repair   LEFT OOPHORECTOMY Left 2005   multiple complex cysts   llq incisional hernia repair & mini-tummy tuck  07/1994   nasal surgeries     9/87, 9/00, 11/04; deviated septum, ethmoidectomy, rhinoplasty, rhinoplasty revision   Patient Active Problem List   Diagnosis Date Noted   Elevated hemoglobin (HCC) 10/10/2022   Osteopenia after menopause 10/04/2021   Elevated blood pressure reading in office with white coat syndrome, without diagnosis of  hypertension 10/21/2020   Perineal irritation in female 01/17/2020   Hyperplastic polyp of large intestine 01/15/2020   Diverticulosis of colon 01/15/2020   Internal hemorrhoid, bleeding 01/15/2020   Chronic migraine without aura without status migrainosus, not intractable 09/21/2019   History of migraine headaches 07/30/2019   Educated about COVID-19 virus infection 01/16/2019   Statin intolerance 08/08/2017   Encounter for preventive health examination 06/03/2017   Hyperlipidemia LDL goal <130 10/16/2016   Anxiety state 10/16/2016   Allergy to iodinated contrast media 10/16/2016    PCP: Marylynn   REFERRING PROVIDER: Verdon MART DIAG:   Rationale for Evaluation and Treatment Rehabilitation  THERAPY DIAG:  Chronic right-sided low back pain without sciatica  Sacrococcygeal disorders, not elsewhere classified    ONSET DATE:   SUBJECTIVE:               SUBJECTIVE STATEMENT TODAY:   Pt reported her R LBP improved last week 50%. But today it is hurting because she has not stretched  SUBJECTIVE STATEMENT ON EVAL  02/24/24: 1) In 04/2023 , pt noticed a perineal bulge when having not having a bowel movements. Pt has to use her finger to press perineum on the R side .  Then this Sx got better and started doing the past Pelvic PT exericses with deep core  and clamshells  It got better and no longer an issue at the moment .  Daily movements without straining . But when she goes on vacation, she gets constipation.    2) non-radiating  R LBP, hip pain -  Pt feels better and has no pain when she is walking or pickleball playing. When she is not active: pain is 7/10. Easing factors: stretches, massaging.  Pt stopped wearing shoe lift for her R shorter leg in April 2025 when it got how and she shifted to wearing a sandal without a back strap when leaving the house or  went barefoot in the house.    Pt plays pickleball across 4 x a time. Pt has not been doing complimentary stretches to pickleball playing as advised during her last PT sessions.     Pt walks 3 days with 3 miles. Pt does some stretches after playing pickleball but not stretches after walking.    Pt also holds her 20 lb granddtr on her L hip often .      3) tight hamstrings in both legs. 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 Yes: see above   PRECAUTIONS: None  WEIGHT BEARING RESTRICTIONS: No   FALLS:  Has patient fallen in last 6 months? 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  One is an 64 month old weighs 20 lb which she has to carry    PLOF: Independent    OBJECTIVE:   [facility] PT Assessment - 03/12/24 1039       Strength   Overall Strength Comments hip ext L 3/5, R 4/5, B hip abd 3/5      Palpation   SI assessment  levelled pelvic girdle    Palpation comment hypmobile T10-12, limited intercostal anterior rib T10-11, interspinal mm tightness from T/L junction to cervical, rhomoboids R      Ambulation/Gait   Gait Comments thoracic shift to R               [facility] Adult PT Treatment/Exercise - 03/12/24 1039       Neuro Re-ed    Neuro Re-ed Details  cued for multidifis strengthening with L rotaion only to strengthen R lumbar to correct thoracic shift R with sequence, coordination, and propioception      Manual Therapy   Manual therapy comments STM/MWM at problem areas noted in assessment to realign spine and create mobility at T/L junction               HOME EXERCISE PROGRAM: See pt instruction section    ASSESSMENT:  CLINICAL  IMPRESSION:     Following Tx today which pt tolerated without complaints,  pt demo'd more realigned thorax to midline and less R lateral shift with more mobility at cervico/thoraicic/ SIJ on the R side along with improved pain.  Provided cues for technique and alignment / proprioception for  complimentary stretches to pickleball playing to minimize overuse of R upper body twisting with racket ( pt is R handed).  Advanced to multifidis strengthening on the R side. Cued for multidifis strengthening with L rotaion only to strengthen R lumbar to correct thoracic shift R with sequence, coordination, and propioception . Excessive cues for more WBing on RLE which is not the planted leg in pickleball.   Plan to continue to  address realignment of spine/ pelvis at next session to help promote optimize IAP system for improved pelvic floor function, trunk stability, gait, balance, stabilization with mobility tasks.                                                     Regional interdependent approaches will yield greater benefits in pt's POC.        Plan to add strengthening of gluts at next session.                                                      Pt benefits from skilled PT.    OBJECTIVE IMPAIRMENTS decreased activity tolerance, decreased coordination, decreased endurance, decreased mobility, difficulty walking, decreased ROM, decreased strength, decreased safety awareness, hypomobility, increased muscle spasms, impaired flexibility, improper body mechanics, postural dysfunction, and pain.   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 Explained  muscles attachments/ connection, physiology of deep core system/ spinal- thoracic-pelvis-lower kinetic chain as they relate to pt's presentation, Sx, and past Hx. Explained what and how these areas of deficits need to be restored to balance and function    See Therapeutic activity / neuromuscular re-education section  Answered pt's questions.   Person educated: Patient Education method: Explanation, Demonstration, Tactile cues, Verbal cues, and Handouts Education comprehension: verbalized understanding, returned demonstration, verbal cues required, tactile cues required, and needs further education     PLAN: PT FREQUENCY: 1x/week   PT DURATION: 10 weeks   PLANNED INTERVENTIONS:   Gait training;Stair training;Functional mobility training;DME Instruction;Therapeutic activities;Therapeutic exercise;Balance training;Neuromuscular re-education;Patient/family education;Vestibular;Visual/perceptual remediation/compensation;Passive range of motion;Moist Heat;Cryotherapy;Traction;Canalith Repostioning;Joint Manipulations;Manual lymph drainage;Manual techniques;Scar mobilization;Energy conservation;Dry needling;ADLs/Self Care Home Management;Biofeedback;Electrical Stimulation;Taping    PLAN FOR NEXT SESSION: See clinical impression for plan     GOALS: Goals reviewed with patient? Yes  SHORT TERM GOALS: Target date: 03/23/2024    Pt will demo IND with HEP                    Baseline: Not IND            Goal status: INITIAL   LONG TERM GOALS: Target date: 05/04/2024    1.Pt will demo proper deep core coordination without chest breathing and optimal excursion of diaphragm/pelvic floor in order to promote spinal stability and pelvic floor function  Baseline: dyscoordination Goal status: INITIAL  2.  Pt will demo proper body mechanics in against gravity tasks and ADLs  work tasks, fitness  to minimize straining pelvic floor / back    Baseline: not IND, improper form that places strain on  pelvic floor  Goal status: INITIAL    3. Pt will demo increased gait speed > 1.3 m/s with reciprocal gait pattern, longer stride length  in order to ambulate safely in community and return to fitness routine  Baseline:  1.16 m/s without R shoe lift, minimal trunk rotation, R thorax posterior rotation,  with shoe lift in R shoe,  0.96 m/s with shoe lift with minimal trunk rotation , R thorax posterior rotation   Goal status: INITIAL    4. Pt will demo increased hip and knee strength> 4/5 bilaterally  in order continue playing pickleball and walking with less relapse of pain Baseline: knee flex/ext 3/5, R 4/5  , B hip flex 4/5 , R hip abd 4-/5, L 4+/5  Goal status: INITIAL   5. Pt will improve ODI  questionnaire to  pts  score change  to demo improved QOL  Baseline:    (Decreased  pts indicate greater  impact on QOL)     pts  ( total)  Goal status: INITIAL  6. Pt will be IND with flexibility routine to improve hamstring flexibility and to compliment pickleball and walking  Baseline: no flexibility routine after walking and pickleball Goal status: INITIAL   7.  Pt will report decreased R LBP/ hip pain < 3/10 when she is not active in order to QOL   Baseline:  R LBP/ R hip   pain:     When she is not active: pain is 7/10. Easing factors: stretches, massaging.  Goal status: INITIAL   Pia Lupe Plump, PT 03/12/2024, 10:44 AM

## 2024-03-13 NOTE — Telephone Encounter (Signed)
 Pt has an appt scheduled with MYRTIS Roys. Will cancel if she gets better over the weekend

## 2024-03-13 NOTE — Telephone Encounter (Signed)
 Spoke with patient to make her aware of recommendations. Patient states she has already started the OTC Monistat. Patient was advised that if her symptoms get worse over the weekend and not feeling any better before she goes out of town. To go to a local urgent care or give our office a call to see if she can get in with one of our providers here in office. Verbalized understanding.

## 2024-03-16 ENCOUNTER — Ambulatory Visit: Admitting: Physical Therapy

## 2024-03-16 DIAGNOSIS — M533 Sacrococcygeal disorders, not elsewhere classified: Secondary | ICD-10-CM | POA: Diagnosis not present

## 2024-03-16 DIAGNOSIS — M2170 Unequal limb length (acquired), unspecified site: Secondary | ICD-10-CM

## 2024-03-16 DIAGNOSIS — M545 Other chronic pain: Secondary | ICD-10-CM

## 2024-03-16 NOTE — Therapy (Signed)
 OUTPATIENT PHYSICAL THERAPY  TREATMENT   Patient Name: FARAH LEPAK MRN: [id number] DOB:1956-03-08, 68 y.o., female Today's Date: 03/16/2024   PT End of Session - 03/16/24 0928     Visit Number 4    Number of Visits 10    Date for PT Re-Evaluation 05/04/24    PT Start Time 0930    PT Stop Time 1015    PT Time Calculation (min) 45 min    Activity Tolerance Patient tolerated treatment well;No increased pain    Behavior During Therapy [facility] for tasks assessed/performed            Past Medical History:  Diagnosis Date   Actinic keratosis    Basal cell carcinoma 04/13/2008   Right temple   Basal cell carcinoma 10/07/2019   Right chest   Basal cell carcinoma (BCC) of face 03/08/2009   Right temple, recurrent   Basal cell carcinoma (BCC) of face 11/28/2015   Right temple, hairline. Nodular pattern   Basal cell carcinoma of trunk 03/29/2014   Right paraspinal upper back. Superficial    Cancer (HCC)    skin   GERD (gastroesophageal reflux disease)    Hyperlipidemia    Migraine    Squamous cell carcinoma of skin 05/24/2021   Left chest [facility]   Past Surgical History:  Procedure Laterality Date   CERVICAL CERCLAGE     6/84, 1/90, 8/93 under epidurals   CESAREAN SECTION  1984, 1986, 1990, 1994   CHOLECYSTECTOMY  1994   COLONOSCOPY N/A 12/13/2014   Procedure: COLONOSCOPY;  Surgeon: Lamar ONEIDA Holmes, MD;  Location: [facility] ENDOSCOPY;  Service: Endoscopy;  Laterality: N/A;   EYE SURGERY  1998   HERNIA REPAIR  1996   left hernia repair   LEFT OOPHORECTOMY Left 2005   multiple complex cysts   llq incisional hernia repair & mini-tummy tuck  07/1994   nasal surgeries     9/87, 9/00, 11/04; deviated septum, ethmoidectomy, rhinoplasty, rhinoplasty revision   Patient Active Problem List   Diagnosis Date Noted   Elevated hemoglobin (HCC) 10/10/2022   Osteopenia after menopause 10/04/2021   Elevated blood pressure reading in office with white coat syndrome, without diagnosis of  hypertension 10/21/2020   Perineal irritation in female 01/17/2020   Hyperplastic polyp of large intestine 01/15/2020   Diverticulosis of colon 01/15/2020   Internal hemorrhoid, bleeding 01/15/2020   Chronic migraine without aura without status migrainosus, not intractable 09/21/2019   History of migraine headaches 07/30/2019   Educated about COVID-19 virus infection 01/16/2019   Statin intolerance 08/08/2017   Encounter for preventive health examination 06/03/2017   Hyperlipidemia LDL goal <130 10/16/2016   Anxiety state 10/16/2016   Allergy to iodinated contrast media 10/16/2016    PCP: Marylynn   REFERRING PROVIDER: Verdon MART DIAG:   Rationale for Evaluation and Treatment Rehabilitation  THERAPY DIAG:  Chronic right-sided low back pain without sciatica  Sacrococcygeal disorders, not elsewhere classified    ONSET DATE:   SUBJECTIVE:               SUBJECTIVE STATEMENT TODAY:   Pt reported her R LBP felt better last last session but she started to notice L knee pain and B groin pain . Pt is still without shoe lift on R shoe  SUBJECTIVE STATEMENT ON EVAL  02/24/24: 1) In 04/2023 , pt noticed a perineal bulge when having not having a bowel movements. Pt has to use her finger to press perineum on the R side .  Then this Sx got better and started doing the past Pelvic PT exericses with deep core  and clamshells  It got better and no longer an issue at the moment .  Daily movements without straining . But when she goes on vacation, she gets constipation.    2) non-radiating  R LBP, hip pain -  Pt feels better and has no pain when she is walking or pickleball playing. When she is not active: pain is 7/10. Easing factors: stretches, massaging.  Pt stopped wearing shoe lift for her R shorter leg in April 2025 when it got how and she shifted to wearing a sandal  without a back strap when leaving the house or went barefoot in the house.    Pt plays pickleball across 4 x a time. Pt has not been doing complimentary stretches to pickleball playing as advised during her last PT sessions.     Pt walks 3 days with 3 miles. Pt does some stretches after playing pickleball but not stretches after walking.    Pt also holds her 20 lb granddtr on her L hip often .      3) tight hamstrings in both legs. 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 Yes: see above   PRECAUTIONS: None  WEIGHT BEARING RESTRICTIONS: No   FALLS:  Has patient fallen in last 6 months? 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  One is an 5 month old weighs 20 lb which she has to carry    PLOF: Independent    OBJECTIVE:     Assessment:  Spine :no more shift of thorax to R , but R pelvis is lowered than L      [facility] Adult PT Treatment/Exercise - 03/16/24 0955       Ambulation/Gait   Gait Comments no more shift of thorax to R , but R pelvis is lowered than L      Self-Care   Self-Care Other Self-Care Comments    Other Self-Care Comments  active listening to stressors in her schedule,  current GI issues, and how she is looking forward to vacation for next 2 weeks in [place] with husband.  MOdified new HEP posterior chain strenghteing to be able to do in her hotel room during trip      Therapeutic Activites    Other Therapeutic Activities discussed wearing shoe lift again in R shoe, Fitted a shoe lift in her tennis shoe today. Pt reported no more L knee pain / B groin pain      Neuro Re-ed    Neuro Re-ed Details  cuedf for posterior chain strenghtening with propicoeption  cues                 HOME EXERCISE PROGRAM: See pt instruction section    ASSESSMENT:  CLINICAL IMPRESSION:     Pt showed good carry over with more  realigned thorax to  midline and less R lateral shift with more mobility at cervico/thoraicic/ SIJ.  This indicates success with the past sessions which focused on rebalancing the musculature overuse from the repeated torsion pattern involved with pickle ball playing.  While pt no longer feels R LBP from past session, pt is noticing L knee pain and B groin sine last session.  Upon assessment, R pelvic alignment was lowered than L.  Therefore,  fitted shoe lift on R shoe again due to lowered R pelvis alignment.  Pt reported no pain once shoe lift was in R shoe.    Provided cues for technique and alignment / proprioception new HEP to strengthening posterior thoracolumbar / glut strengthening. Pt demo'd correctly post Tx .  Modified new HEP posterior chain strenghteing to be able to do in her hotel room during trip next 2 weeks. Filmed new HEP on pt's phone per her request.     Anticipate these improvements will promote optimize IAP system for improved pelvic floor function, trunk stability, gait, balance, stabilization with mobility tasks.                                                     Regional interdependent approaches will yield greater benefits in pt's POC.        Plan to add  more  strengthening of gluts at next session.                                                      Pt benefits from skilled PT.    OBJECTIVE IMPAIRMENTS decreased activity tolerance, decreased coordination, decreased endurance, decreased mobility, difficulty walking, decreased ROM, decreased strength, decreased safety awareness, hypomobility, increased muscle spasms, impaired flexibility, improper body mechanics, postural dysfunction, and pain.   ACTIVITY LIMITATIONS  self-care, sleep, grandmother  tasks    PARTICIPATION LIMITATIONS:  community, child  giving as a grandmother  activities    PERSONAL FACTORS   affecting patient's functional outcome:    REHAB POTENTIAL: Good   CLINICAL DECISION MAKING: Evolving/moderate complexity   EVALUATION COMPLEXITY: Moderate    PATIENT EDUCATION:    Education details: Showed pt anatomy images. Explained muscles attachments/ connection, physiology of deep core system/ spinal- thoracic-pelvis-lower kinetic chain as they relate to pt's presentation, Sx, and past Hx. Explained what and how these areas of deficits need to be restored to balance and function    See Therapeutic activity / neuromuscular re-education section  Answered pt's questions.   Person educated: Patient Education method: Explanation, Demonstration, Tactile cues, Verbal cues, and Handouts Education comprehension: verbalized understanding, returned demonstration, verbal cues required, tactile cues required, and needs further education     PLAN: PT FREQUENCY: 1x/week   PT DURATION: 10 weeks   PLANNED INTERVENTIONS:   Gait training;Stair training;Functional mobility training;DME Instruction;Therapeutic activities;Therapeutic exercise;Balance training;Neuromuscular re-education;Patient/family education;Vestibular;Visual/perceptual remediation/compensation;Passive range of motion;Moist Heat;Cryotherapy;Traction;Canalith Repostioning;Joint Manipulations;Manual lymph drainage;Manual techniques;Scar mobilization;Energy conservation;Dry needling;ADLs/Self Care Home Management;Biofeedback;Electrical Stimulation;Taping    PLAN FOR NEXT SESSION: See clinical impression for plan     GOALS: Goals reviewed with patient? Yes  SHORT TERM GOALS: Target date: 03/23/2024    Pt will demo IND with HEP  Baseline: Not IND            Goal status: INITIAL   LONG TERM GOALS: Target date: 05/04/2024    1.Pt will demo proper deep core coordination without chest breathing and optimal excursion of diaphragm/pelvic floor in  order to promote spinal stability and pelvic floor function  Baseline: dyscoordination Goal status: INITIAL  2.  Pt will demo proper body mechanics in against gravity tasks and ADLs  work tasks, fitness  to minimize straining pelvic floor / back    Baseline: not IND, improper form that places strain on pelvic floor  Goal status: INITIAL    3. Pt will demo increased gait speed > 1.3 m/s with reciprocal gait pattern, longer stride length  in order to ambulate safely in community and return to fitness routine  Baseline:  1.16 m/s without R shoe lift, minimal trunk rotation, R thorax posterior rotation,  with shoe lift in R shoe,  0.96 m/s with shoe lift with minimal trunk rotation , R thorax posterior rotation   Goal status: INITIAL    4. Pt will demo increased hip and knee strength> 4/5 bilaterally  in order continue playing pickleball and walking with less relapse of pain Baseline: knee flex/ext 3/5, R 4/5  , B hip flex 4/5 , R hip abd 4-/5, L 4+/5  Goal status: INITIAL   5. Pt will improve ODI  questionnaire to  pts  score change  to demo improved QOL  Baseline:    (Decreased  pts indicate greater  impact on QOL)     pts  ( total)  Goal status: INITIAL  6. Pt will be IND with flexibility routine to improve hamstring flexibility and to compliment pickleball and walking  Baseline: no flexibility routine after walking and pickleball Goal status: INITIAL   7.  Pt will report decreased R LBP/ hip pain < 3/10 when she is not active in order to QOL   Baseline:  R LBP/ R hip   pain:     When she is not active: pain is 7/10. Easing factors: stretches, massaging.  Goal status: INITIAL   Pia Lupe Plump, PT 03/16/2024, 10:04 AM

## 2024-03-16 NOTE — Patient Instructions (Signed)
  SKI against chair   In front of chair, knee against seat to line knee above ankle,  back foot hip width apart, push off through ballmounds,  opp arm up, thumbs up, chin tuck, shoulders down  Push off through ballmounds , step back to under hip width   3 min   __      Letter T, seeasaw  , tracking knee in line with 2-3rd toe line Navel over knee , knee over ballmound  Dipping forward, R foot lifts slight ( whole body like a see saw) off floor or  Press back foot against wall  20-30 eps  Both    __  Deep core level 1-2 (handout)       '

## 2024-03-17 ENCOUNTER — Ambulatory Visit: Admitting: Family

## 2024-03-23 ENCOUNTER — Ambulatory Visit: Admitting: Physical Therapy

## 2024-04-06 ENCOUNTER — Ambulatory Visit: Admitting: Physical Therapy

## 2024-04-06 DIAGNOSIS — G8929 Other chronic pain: Secondary | ICD-10-CM

## 2024-04-06 DIAGNOSIS — M533 Sacrococcygeal disorders, not elsewhere classified: Secondary | ICD-10-CM

## 2024-04-06 NOTE — Patient Instructions (Signed)
 Do winging and brushing on L  Do half circle by wall on L   Remove shoe lift in R shoe

## 2024-04-06 NOTE — Therapy (Signed)
 OUTPATIENT PHYSICAL THERAPY  TREATMENT   Patient Name: Brianna Calhoun MRN: [id number] DOB:27-Dec-1955, 68 y.o., female Today's Date: 04/06/2024   PT End of Session - 04/06/24 0906     Visit Number 5    Number of Visits 10    Date for Recertification  05/04/24    PT Start Time 0852    PT Stop Time 0930    PT Time Calculation (min) 38 min    Activity Tolerance Patient tolerated treatment well;No increased pain    Behavior During Therapy [facility] for tasks assessed/performed            Past Medical History:  Diagnosis Date   Actinic keratosis    Basal cell carcinoma 04/13/2008   Right temple   Basal cell carcinoma 10/07/2019   Right chest   Basal cell carcinoma (BCC) of face 03/08/2009   Right temple, recurrent   Basal cell carcinoma (BCC) of face 11/28/2015   Right temple, hairline. Nodular pattern   Basal cell carcinoma of trunk 03/29/2014   Right paraspinal upper back. Superficial    Cancer (HCC)    skin   GERD (gastroesophageal reflux disease)    Hyperlipidemia    Migraine    Squamous cell carcinoma of skin 05/24/2021   Left chest [facility]   Past Surgical History:  Procedure Laterality Date   CERVICAL CERCLAGE     6/84, 1/90, 8/93 under epidurals   CESAREAN SECTION  1984, 1986, 1990, 1994   CHOLECYSTECTOMY  1994   COLONOSCOPY N/A 12/13/2014   Procedure: COLONOSCOPY;  Surgeon: Lamar ONEIDA Holmes, MD;  Location: [facility] ENDOSCOPY;  Service: Endoscopy;  Laterality: N/A;   EYE SURGERY  1998   HERNIA REPAIR  1996   left hernia repair   LEFT OOPHORECTOMY Left 2005   multiple complex cysts   llq incisional hernia repair & mini-tummy tuck  07/1994   nasal surgeries     9/87, 9/00, 11/04; deviated septum, ethmoidectomy, rhinoplasty, rhinoplasty revision   Patient Active Problem List   Diagnosis Date Noted   Elevated hemoglobin 10/10/2022   Osteopenia after menopause 10/04/2021   Elevated blood pressure reading in office with white coat syndrome, without diagnosis of  hypertension 10/21/2020   Perineal irritation in female 01/17/2020   Hyperplastic polyp of large intestine 01/15/2020   Diverticulosis of colon 01/15/2020   Internal hemorrhoid, bleeding 01/15/2020   Chronic migraine without aura without status migrainosus, not intractable 09/21/2019   History of migraine headaches 07/30/2019   Educated about COVID-19 virus infection 01/16/2019   Statin intolerance 08/08/2017   Encounter for preventive health examination 06/03/2017   Hyperlipidemia LDL goal <130 10/16/2016   Anxiety state 10/16/2016   Allergy to iodinated contrast media 10/16/2016    PCP: Marylynn   REFERRING PROVIDER: Verdon MART DIAG:   Rationale for Evaluation and Treatment Rehabilitation  THERAPY DIAG:  Chronic right-sided low back pain without sciatica  Sacrococcygeal disorders, not elsewhere classified    ONSET DATE:   SUBJECTIVE:               SUBJECTIVE STATEMENT TODAY:   Pt reported no pain during her trip of travelling in cars throughout [employer]  in the [place].  SUBJECTIVE STATEMENT ON EVAL  02/24/24: 1) In 04/2023 , pt noticed a perineal bulge when having not having a bowel movements. Pt has to use her finger to press perineum on the R side .  Then this Sx got better and started doing the past Pelvic PT exericses with deep core  and clamshells  It got better and no longer an issue at the moment .  Daily movements without straining . But when she goes on vacation, she gets constipation.    2) non-radiating  R LBP, hip pain -  Pt feels better and has no pain when she is walking or pickleball playing. When she is not active: pain is 7/10. Easing factors: stretches, massaging.  Pt stopped wearing shoe lift for her R shorter leg in April 2025 when it got how and she shifted to wearing a sandal without a back strap when leaving the house or went  barefoot in the house.    Pt plays pickleball across 4 x a time. Pt has not been doing complimentary stretches to pickleball playing as advised during her last PT sessions.     Pt walks 3 days with 3 miles. Pt does some stretches after playing pickleball but not stretches after walking.    Pt also holds her 20 lb granddtr on her L hip often .      3) tight hamstrings in both legs. 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 Yes: see above   PRECAUTIONS: None  WEIGHT BEARING RESTRICTIONS: No   FALLS:  Has patient fallen in last 6 months? 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  One is an 76 month old weighs 20 lb which she has to carry    PLOF: Independent    OBJECTIVE:       [facility] PT Assessment - 04/06/24 0906       Palpation   SI assessment  L iliac crest higher in supine,  R iliac crest higher in standing/ seated with shoe lift in R shoe pre Tx,   levelled pelvis and shoulder in standing without shoe lift in R shoe and after Tx    Palpation comment hypomobile T/L junction, L anterior rib T8-10 limited excursion, R occiput / interspinal mm at L thoracic region , tightness at intercostals,      Ambulation/Gait   Gait Comments no more shift of thorax to R , but R pelvis is lowered than L           [facility] Adult PT Treatment/Exercise - 04/06/24 0906       Therapeutic Activites    Other Therapeutic Activities Recommended complimentary stretches to pickle ball, discussed future sessions to assess R Morton neuroma which might be contributing to compensatory strategies causing asymmetries to the spine,  discussed removing shoe lift out of R shoe this week      Neuro Re-ed    Neuro  Re-ed Details  cued for L thoracic rotation to improve L anterior / lateral excursion      Manual Therapy   Manual therapy comments STM/MWM at problem areas noted in assessment to realign spine and create  mobility at T/L junction              HOME EXERCISE PROGRAM: See pt instruction section    ASSESSMENT:  CLINICAL IMPRESSION:  Pt reported no pain during her trip of travelling in cars throughout [employer]  in the [place].                                                                                                                 Pt showed good carry over with more  realigned thorax to midline and less R lateral shift with more mobility at cervico/thoraicic/ SIJ.  This indicates success with the past sessions which focused on rebalancing the musculature overuse from the repeated torsion pattern involved with pickle ball playing.  Pt did no play pickleball while traveling.   However, pt reports Hx of R morton neuroma which is causing her to walk in compensatory way  with more WBing on R outer foot. Plan to assess this area more next session   Upon assessment, R pelvic alignment was lowered than L in standing, supine, and seated. Pt received manual Tx to L thoracic/ scapular, R occiput area which helped to level out spine post Tx  Removed shoe lift in R shoe today.   Recommended complimentary stretches to pickle ball and to do stretches to L side this week, discussed future sessions to assess R Morton neuroma which might be contributing to compensatory strategies causing asymmetries to the spine,  discussed removing shoe lift out of R shoe this week     Anticipate these improvements will promote optimize IAP system for improved pelvic floor function, trunk stability, gait, balance, stabilization with mobility tasks.                                                     Regional interdependent approaches will yield greater benefits in pt's POC.        Plan to add  more   strengthening of gluts at next session.                                                      Pt benefits from skilled PT.    OBJECTIVE IMPAIRMENTS decreased activity tolerance, decreased coordination, decreased endurance, decreased mobility, difficulty walking, decreased ROM, decreased strength, decreased safety awareness, hypomobility, increased muscle spasms, impaired flexibility, improper body mechanics, postural dysfunction, and pain.   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 Explained muscles attachments/ connection, physiology of deep core system/ spinal- thoracic-pelvis-lower kinetic chain as they relate to pt's presentation, Sx, and past Hx. Explained what and how these areas of deficits need to be restored to balance and function    See Therapeutic activity / neuromuscular re-education section  Answered pt's questions.   Person educated: Patient Education method: Explanation, Demonstration, Tactile cues, Verbal cues, and Handouts Education comprehension: verbalized understanding, returned demonstration, verbal cues required, tactile cues required, and needs further education     PLAN: PT FREQUENCY: 1x/week   PT DURATION: 10 weeks   PLANNED INTERVENTIONS:   Gait training;Stair training;Functional mobility training;DME Instruction;Therapeutic activities;Therapeutic exercise;Balance training;Neuromuscular re-education;Patient/family education;Vestibular;Visual/perceptual remediation/compensation;Passive range of motion;Moist Heat;Cryotherapy;Traction;Canalith Repostioning;Joint Manipulations;Manual lymph drainage;Manual  techniques;Scar mobilization;Energy conservation;Dry needling;ADLs/Self Care Home Management;Biofeedback;Electrical Stimulation;Taping    PLAN FOR NEXT SESSION: See clinical impression for plan     GOALS: Goals reviewed with patient? Yes  SHORT TERM GOALS: Target date: 03/23/2024    Pt will demo IND with HEP                    Baseline: Not IND            Goal status: INITIAL   LONG TERM GOALS: Target date: 05/04/2024    1.Pt will demo proper deep core coordination without chest breathing and optimal excursion of diaphragm/pelvic floor in order to promote spinal stability and pelvic floor function  Baseline: dyscoordination Goal status: INITIAL  2.  Pt will demo proper body mechanics in against gravity tasks and ADLs  work tasks, fitness  to minimize straining pelvic floor / back    Baseline: not IND, improper form that places strain on pelvic floor  Goal status: INITIAL    3. Pt will demo increased gait speed > 1.3 m/s with reciprocal gait pattern, longer stride length  in order to ambulate safely in community and return to fitness routine  Baseline:  1.16 m/s without R shoe lift, minimal trunk rotation, R thorax posterior rotation,  with shoe lift in R shoe,  0.96 m/s with shoe lift with minimal trunk rotation , R thorax posterior rotation   Goal status: INITIAL    4. Pt will demo increased hip and knee strength> 4/5 bilaterally  in order continue playing pickleball and walking with less relapse of pain Baseline: knee flex/ext 3/5, R 4/5  , B hip flex 4/5 , R hip abd 4-/5, L 4+/5  Goal status: INITIAL   5. Pt will improve ODI  questionnaire to  pts  score change  to demo improved QOL  Baseline:    (Decreased  pts indicate greater  impact on QOL)     pts  ( total)  Goal status: INITIAL  6. Pt will be IND with flexibility routine to improve hamstring flexibility and to compliment pickleball and walking  Baseline: no flexibility routine after walking and  pickleball Goal status: INITIAL   7.  Pt will report decreased R LBP/ hip pain < 3/10 when she is not active in order to QOL   Baseline:  R LBP/ R hip   pain:     When she is not active: pain is 7/10. Easing factors: stretches, massaging.  Goal status: INITIAL   Pia Lupe Plump, PT 04/06/2024, 9:51 AM

## 2024-04-13 ENCOUNTER — Ambulatory Visit: Admitting: Physical Therapy

## 2024-04-20 ENCOUNTER — Ambulatory Visit: Attending: Obstetrics and Gynecology | Admitting: Physical Therapy

## 2024-04-20 DIAGNOSIS — M545 Low back pain, unspecified: Secondary | ICD-10-CM | POA: Diagnosis present

## 2024-04-20 DIAGNOSIS — M25561 Pain in right knee: Secondary | ICD-10-CM | POA: Insufficient documentation

## 2024-04-20 DIAGNOSIS — M542 Cervicalgia: Secondary | ICD-10-CM | POA: Diagnosis present

## 2024-04-20 DIAGNOSIS — M2170 Unequal limb length (acquired), unspecified site: Secondary | ICD-10-CM | POA: Diagnosis present

## 2024-04-20 DIAGNOSIS — G8929 Other chronic pain: Secondary | ICD-10-CM | POA: Diagnosis present

## 2024-04-20 DIAGNOSIS — M533 Sacrococcygeal disorders, not elsewhere classified: Secondary | ICD-10-CM | POA: Diagnosis present

## 2024-04-20 NOTE — Patient Instructions (Signed)
 Feet care :  Self -feet massage   Handshake : fingers between toes, moving ballmounds/toes back and forth several times while other hand anchors at arch. Do the same at the hind/mid foot.  Heel to toes upward to a letter Big Letter T strokes to spread ballmounds and toes, several times, pinch between webs of toes  Run finger tips along top of foot between long bones comb between the bones    Wiggle toes and spread them out when relaxing  3 min

## 2024-04-20 NOTE — Therapy (Signed)
 OUTPATIENT PHYSICAL THERAPY  TREATMENT   Patient Name: Brianna Calhoun MRN: [id number] DOB:March 23, 1956, 68 y.o., female Today's Date: 04/20/2024   PT End of Session - 04/20/24 0855     Visit Number 6    Number of Visits 10    Date for Recertification  05/04/24    PT Start Time 0847    PT Stop Time 0930    PT Time Calculation (min) 43 min    Activity Tolerance Patient tolerated treatment well;No increased pain    Behavior During Therapy [facility] for tasks assessed/performed            Past Medical History:  Diagnosis Date   Actinic keratosis    Basal cell carcinoma 04/13/2008   Right temple   Basal cell carcinoma 10/07/2019   Right chest   Basal cell carcinoma (BCC) of face 03/08/2009   Right temple, recurrent   Basal cell carcinoma (BCC) of face 11/28/2015   Right temple, hairline. Nodular pattern   Basal cell carcinoma of trunk 03/29/2014   Right paraspinal upper back. Superficial    Cancer (HCC)    skin   GERD (gastroesophageal reflux disease)    Hyperlipidemia    Migraine    Squamous cell carcinoma of skin 05/24/2021   Left chest [facility]   Past Surgical History:  Procedure Laterality Date   CERVICAL CERCLAGE     6/84, 1/90, 8/93 under epidurals   CESAREAN SECTION  1984, 1986, 1990, 1994   CHOLECYSTECTOMY  1994   COLONOSCOPY N/A 12/13/2014   Procedure: COLONOSCOPY;  Surgeon: Lamar ONEIDA Holmes, MD;  Location: [facility] ENDOSCOPY;  Service: Endoscopy;  Laterality: N/A;   EYE SURGERY  1998   HERNIA REPAIR  1996   left hernia repair   LEFT OOPHORECTOMY Left 2005   multiple complex cysts   llq incisional hernia repair & mini-tummy tuck  07/1994   nasal surgeries     9/87, 9/00, 11/04; deviated septum, ethmoidectomy, rhinoplasty, rhinoplasty revision   Patient Active Problem List   Diagnosis Date Noted   Elevated hemoglobin 10/10/2022   Osteopenia after menopause 10/04/2021   Elevated blood pressure reading in office with white coat syndrome, without diagnosis of  hypertension 10/21/2020   Perineal irritation in female 01/17/2020   Hyperplastic polyp of large intestine 01/15/2020   Diverticulosis of colon 01/15/2020   Internal hemorrhoid, bleeding 01/15/2020   Chronic migraine without aura without status migrainosus, not intractable 09/21/2019   History of migraine headaches 07/30/2019   Educated about COVID-19 virus infection 01/16/2019   Statin intolerance 08/08/2017   Encounter for preventive health examination 06/03/2017   Hyperlipidemia LDL goal <130 10/16/2016   Anxiety state 10/16/2016   Allergy to iodinated contrast media 10/16/2016    PCP: Marylynn   REFERRING PROVIDER: Verdon MART DIAG:   Rationale for Evaluation and Treatment Rehabilitation  THERAPY DIAG:  Chronic right-sided low back pain without sciatica  Sacrococcygeal disorders, not elsewhere classified    ONSET DATE:   SUBJECTIVE:               SUBJECTIVE STATEMENT TODAY:   Pt reported  she has not been doing her HEP this past week. Pt continues to not wear shoe lift. Pt has pain at R iliac crest area today  SUBJECTIVE STATEMENT ON EVAL  02/24/24: 1) In 04/2023 , pt noticed a perineal bulge when having not having a bowel movements. Pt has to use her finger to press perineum on the R side .  Then this Sx got better and started doing the past Pelvic PT exericses with deep core  and clamshells  It got better and no longer an issue at the moment .  Daily movements without straining . But when she goes on vacation, she gets constipation.    2) non-radiating  R LBP, hip pain -  Pt feels better and has no pain when she is walking or pickleball playing. When she is not active: pain is 7/10. Easing factors: stretches, massaging.  Pt stopped wearing shoe lift for her R shorter leg in April 2025 when it got how and she shifted to wearing a sandal without a back strap when leaving the house or went  barefoot in the house.    Pt plays pickleball across 4 x a time. Pt has not been doing complimentary stretches to pickleball playing as advised during her last PT sessions.     Pt walks 3 days with 3 miles. Pt does some stretches after playing pickleball but not stretches after walking.    Pt also holds her 20 lb granddtr on her L hip often .      3) tight hamstrings in both legs. 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 Yes: see above   PRECAUTIONS: None  WEIGHT BEARING RESTRICTIONS: No   FALLS:  Has patient fallen in last 6 months? 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  One is an 80 month old weighs 20 lb which she has to carry    PLOF: Independent    OBJECTIVE:       [facility] PT Assessment - 04/20/24 0856       Other:   Other/Comments toe flexion, toe gripping      Palpation   SI assessment  levelled pelvis and shoulder,  R thorax shifted to the R, pain at the R iliac crest    Palpation comment tightness along plantar fascia  B, hypomobile midfoot,, tightness and tenderness along dosrum between digit II-I, II-III on L related with her morton neuroma           [facility] Adult PT Treatment/Exercise - 04/20/24 1100       Neuro Re-ed    Neuro Re-ed Details  cued for transverse arch co-activation, tactile cues for less toe flexion nd more toe abduction , tactile cues for knee abduction with great toe WBing with tranverse arch co-activation in feet slide HEP to improve plantar fascia mobility , minimize compensatory feet mechanics altering her pelvic and spinal misalignment due to morton neuroma              HOME EXERCISE PROGRAM: See pt instruction section     ASSESSMENT:  CLINICAL IMPRESSION:  Pt showed relapse of R thorax shift today but pelvis and shoulder remained aligned.  Shoe lift still is no longer required for her alignment because curvatures in spine are no longer present with past manual Tx, rulling out leg length difference  . Pt is compliant with one sided HEP to compliment pick ball playing which causes torsion of spine.   Today, focused on [facility] given pt's morton neuroma on R foot which associated with pt's compensatory strategies to bear weight along lateral foot , supination. Pt also showed toe gripping strategies and limited plantar fascia mobility, DF/EV, hypomobilie midfoot joints . Post Tx, pt demo'd improved DF/EV, toe abduction, plantar fascia. Plan to continue to work on R foot as today's session focused on L foot .    Anticipate these improvements will promote optimize IAP system for improved pelvic floor function, trunk stability, gait, balance, stabilization with mobility tasks.                                                     Regional interdependent approaches will yield greater benefits in pt's POC.        Plan to add  more  strengthening of gluts at next session.                                                      Pt benefits from skilled PT.    OBJECTIVE IMPAIRMENTS decreased activity tolerance, decreased coordination, decreased endurance, decreased mobility, difficulty walking, decreased ROM, decreased strength, decreased safety awareness, hypomobility, increased muscle spasms, impaired flexibility, improper body mechanics, postural dysfunction, and pain.   ACTIVITY LIMITATIONS  self-care, sleep, grandmother  tasks    PARTICIPATION LIMITATIONS:  community, child giving as a grandmother  activities    PERSONAL FACTORS   affecting patient's functional outcome:    REHAB POTENTIAL: Good    CLINICAL DECISION MAKING: Evolving/moderate complexity   EVALUATION COMPLEXITY: Moderate    PATIENT EDUCATION:    Education details: Showed pt anatomy images. Explained muscles attachments/ connection, physiology of deep core system/ spinal- thoracic-pelvis-lower kinetic chain as they relate to pt's presentation, Sx, and past Hx. Explained what and how these areas of deficits need to be restored to balance and function    See Therapeutic activity / neuromuscular re-education section  Answered pt's questions.   Person educated: Patient Education method: Explanation, Demonstration, Tactile cues, Verbal cues, and Handouts Education comprehension: verbalized understanding, returned demonstration, verbal cues required, tactile cues required, and needs further education     PLAN: PT FREQUENCY: 1x/week   PT DURATION: 10 weeks   PLANNED INTERVENTIONS:   Gait training;Stair training;Functional mobility training;DME Instruction;Therapeutic activities;Therapeutic exercise;Balance training;Neuromuscular re-education;Patient/family education;Vestibular;Visual/perceptual remediation/compensation;Passive range of motion;Moist Heat;Cryotherapy;Traction;Canalith Repostioning;Joint Manipulations;Manual lymph drainage;Manual techniques;Scar mobilization;Energy conservation;Dry needling;ADLs/Self Care Home Management;Biofeedback;Electrical Stimulation;Taping    PLAN FOR NEXT SESSION: See clinical impression for plan     GOALS: Goals reviewed with patient? Yes  SHORT TERM GOALS: Target date: 03/23/2024    Pt will demo IND with HEP                    Baseline: Not IND  Goal status: INITIAL   LONG TERM GOALS: Target date: 05/04/2024    1.Pt will demo proper deep core coordination without chest breathing and optimal excursion of diaphragm/pelvic floor in order to promote spinal stability and pelvic floor function  Baseline: dyscoordination Goal status: INITIAL  2.  Pt will demo  proper body mechanics in against gravity tasks and ADLs  work tasks, fitness  to minimize straining pelvic floor / back    Baseline: not IND, improper form that places strain on pelvic floor  Goal status: INITIAL    3. Pt will demo increased gait speed > 1.3 m/s with reciprocal gait pattern, longer stride length  in order to ambulate safely in community and return to fitness routine  Baseline:  1.16 m/s without R shoe lift, minimal trunk rotation, R thorax posterior rotation,  with shoe lift in R shoe,  0.96 m/s with shoe lift with minimal trunk rotation , R thorax posterior rotation   Goal status: INITIAL    4. Pt will demo increased hip and knee strength> 4/5 bilaterally  in order continue playing pickleball and walking with less relapse of pain Baseline: knee flex/ext 3/5, R 4/5  , B hip flex 4/5 , R hip abd 4-/5, L 4+/5  Goal status: INITIAL   5. Pt will improve ODI  questionnaire to  pts  score change  to demo improved QOL  Baseline:    (Decreased  pts indicate greater  impact on QOL)     pts  ( total)  Goal status: INITIAL  6. Pt will be IND with flexibility routine to improve hamstring flexibility and to compliment pickleball and walking  Baseline: no flexibility routine after walking and pickleball Goal status: INITIAL   7.  Pt will report decreased R LBP/ hip pain < 3/10 when she is not active in order to QOL   Baseline:  R LBP/ R hip   pain:     When she is not active: pain is 7/10. Easing factors: stretches, massaging.  Goal status: INITIAL   Pia Lupe Plump, PT 04/20/2024, 11:03 AM

## 2024-04-22 ENCOUNTER — Ambulatory Visit: Payer: Self-pay

## 2024-04-22 NOTE — Telephone Encounter (Signed)
 FYI Only or Action Required?: Action required by provider: referral request. See note- request GI referral or sooner OV   Patient was last seen in primary care on 09/25/2023 by Marylynn Verneita CROME, MD.  Called Nurse Triage reporting Rectal Bleeding.  Symptoms began several weeks ago.  Interventions attempted: Rest, hydration, or home remedies.  Symptoms are: unchanged.  Triage Disposition: Home Care  Patient/caregiver understands and will follow disposition?: Yes  Copied from CRM #[id number]. Topic: Clinical - Red Word Triage >> Apr 22, 2024  9:32 AM Eva FALCON wrote: Red Word that prompted transfer to Nurse Triage: Pt is experience rectal bleeding, states father died of colon cancer. Is very concerned Reason for Disposition  Rectal bleeding is minimal (e.g., blood just on toilet paper, a few drops in toilet bowl)  Answer Assessment - Initial Assessment Questions Over the past 6 weeks patient has had 3 episodes of blood in her stool. As a former Engineer, civil (consulting), she is fairly confident they are related to a hemorrhoid tag. She has longstanding GI issues and is due for her colonoscopy in June but is looking to get it done sooner. Her father died of colon cancer 5 years ago. She attempted to schedule but needs new referral to schedule.   Only one episode was after taking Advil. All with the appearance of streaks of blood on outside of stool, denies dark tarry stools.  Appt made for next available with Dr Marylynn- She is asking if Dr Marylynn will place referral to Gastroenterologist Dr Elesa Gist in [phone] office # prior to her coming in December. Or if the office can find a sooner appt than give her a call.  ED/UC/Callback precautions understood.   1. APPEARANCE of BLOOD: What color is it? Is it passed separately, on the surface of the stool, or mixed in with the stool?      Streaks around the outside, denies dark tarry stools.  2. AMOUNT: How much blood was passed?      Not much-  thinks  3. FREQUENCY: How many times has blood been passed with the stools?      Off and on - 3x in last 6 weeks  4. ONSET: When was the blood first seen in the stools? (Days or weeks)      6 weeks off and on  5. DIARRHEA: Is there also some diarrhea? If Yes, ask: How many diarrhea stools in the past 24 hours?      Has episodes of loose stools at baseline but not always correlated with the blood 6. CONSTIPATION: Do you have constipation? If Yes, ask: How bad is it?     Not much 7. RECURRENT SYMPTOMS: Have you had blood in your stools before? If Yes, ask: When was the last time? and What happened that time?      Recurrent off and on  8. BLOOD THINNERS: Do you take any blood thinners? (e.g., aspirin, clopidogrel / Plavix, coumadin, heparin). Notes: Other strong blood thinners include: Arixtra (fondaparinux), Eliquis (apixaban), Pradaxa (dabigatran), and Xarelto (rivaroxaban).     denies 9. OTHER SYMPTOMS: Do you have any other symptoms?  (e.g., abdomen pain, vomiting, dizziness, fever)     Denies all  Protocols used: Rectal Bleeding-A-AH

## 2024-04-22 NOTE — Telephone Encounter (Signed)
 Spoke with pt and scheduled her for Friday with Dr. Tullo.

## 2024-04-24 ENCOUNTER — Encounter: Payer: Self-pay | Admitting: Internal Medicine

## 2024-04-24 ENCOUNTER — Ambulatory Visit (INDEPENDENT_AMBULATORY_CARE_PROVIDER_SITE_OTHER): Admitting: Internal Medicine

## 2024-04-24 VITALS — BP 116/68 | HR 66 | Ht 65.0 in | Wt 141.8 lb

## 2024-04-24 DIAGNOSIS — K625 Hemorrhage of anus and rectum: Secondary | ICD-10-CM

## 2024-04-24 DIAGNOSIS — R7301 Impaired fasting glucose: Secondary | ICD-10-CM | POA: Diagnosis not present

## 2024-04-24 DIAGNOSIS — E785 Hyperlipidemia, unspecified: Secondary | ICD-10-CM

## 2024-04-24 DIAGNOSIS — R030 Elevated blood-pressure reading, without diagnosis of hypertension: Secondary | ICD-10-CM

## 2024-04-24 DIAGNOSIS — D582 Other hemoglobinopathies: Secondary | ICD-10-CM | POA: Diagnosis not present

## 2024-04-24 DIAGNOSIS — Z800 Family history of malignant neoplasm of digestive organs: Secondary | ICD-10-CM

## 2024-04-24 DIAGNOSIS — K648 Other hemorrhoids: Secondary | ICD-10-CM

## 2024-04-24 MED ORDER — LANSOPRAZOLE 30 MG PO CPDR: 30.0000 mg | DELAYED_RELEASE_CAPSULE | Freq: Every day | ORAL | 0 refills | Status: AC

## 2024-04-24 MED ORDER — FLUCONAZOLE 150 MG PO TABS: 150.0000 mg | ORAL_TABLET | Freq: Every day | ORAL | 1 refills | Status: AC

## 2024-04-24 MED ORDER — HYDROCORTISONE (PERIANAL) 2.5 % EX CREA: 1.0000 | TOPICAL_CREAM | Freq: Two times a day (BID) | CUTANEOUS | 0 refills | Status: AC

## 2024-04-24 NOTE — Progress Notes (Unsigned)
 Subjective:  Patient ID: Brianna Calhoun, female    DOB: 12-Jan-1956  Age: 68 y.o. MRN: [id number]  CC: There were no encounter diagnoses.   HPI JAY KEMPE presents for  Chief Complaint  Patient presents with   Rectal Bleeding    Possible hemorrhoids      Outpatient Medications Prior to Visit  Medication Sig Dispense Refill   acetaminophen (TYLENOL) 325 MG tablet Take 650 mg by mouth every 6 (six) hours as needed.     ALPRAZolam  (XANAX ) 0.25 MG tablet TAKE 1/2 TABLET BY MOUTH AT BEDTIME AS NEEDED FOR ANXIETY 20 tablet 5   estradiol (ESTRACE) 0.1 MG/GM vaginal cream Place vaginally.     ketorolac  (TORADOL ) 60 MG/2ML SOLN injection Inject 1-2ml (30-60mg ) intramuscularly at onset of migraine. May repeat in 6 hours. Max twice a day and 4 days per month. 10 mL 4   methocarbamol  (ROBAXIN ) 500 MG tablet Take 1 tablet (500 mg total) by mouth every 6 (six) hours as needed for muscle spasms. 90 tablet 3   Olopatadine HCl (PATADAY OP) Apply to eye.     ondansetron  (ZOFRAN ) 4 MG tablet Take 1 tablet (4 mg total) by mouth every 8 (eight) hours as needed for nausea or vomiting. 20 tablet 0   OVER THE COUNTER MEDICATION Vitamin B, C, D3 5,000 in/K50mcg 2-3 times per week     Probiotic Product (PROBIOTIC PO) Take 30-50,000,000 Units by mouth at bedtime.     rizatriptan  (MAXALT -MLT) 10 MG disintegrating tablet TAKE ONE TABLET AS NEEDED FOR MIGRAINE. MAY REPEAT IN 2 HOURS IF NEEDED. 10 tablet 5   SYRINGE-NEEDLE, DISP, 3 ML (BD SAFETYGLIDE SYRINGE/NEEDLE) 25G X 1 3 ML MISC Attach needle to syringe and use to draw up and administer Toradol . Do not reuse. 4 each 5   XIIDRA 5 % SOLN Apply 1 drop to eye 2 (two) times daily. (Patient not taking: Reported on 04/24/2024)     No facility-administered medications prior to visit.    Review of Systems;  Patient denies headache, fevers, malaise, unintentional weight loss, skin rash, eye pain, sinus congestion and sinus pain, sore throat, dysphagia,   hemoptysis , cough, dyspnea, wheezing, chest pain, palpitations, orthopnea, edema, abdominal pain, nausea, melena, diarrhea, constipation, flank pain, dysuria, hematuria, urinary  Frequency, nocturia, numbness, tingling, seizures,  Focal weakness, Loss of consciousness,  Tremor, insomnia, depression, anxiety, and suicidal ideation.      Objective:  BP 116/68   Pulse 66   Ht 5' 5 (1.651 m)   Wt 141 lb 12.8 oz (64.3 kg)   SpO2 97%   BMI 23.60 kg/m   BP Readings from Last 3 Encounters:  04/24/24 116/68  02/13/24 132/67  11/21/23 (!) 142/79    Wt Readings from Last 3 Encounters:  04/24/24 141 lb 12.8 oz (64.3 kg)  09/25/23 139 lb 6.4 oz (63.2 kg)  09/26/22 139 lb 9.6 oz (63.3 kg)    Physical Exam  Lab Results  Component Value Date   HGBA1C 5.5 09/25/2023   HGBA1C 5.6 09/26/2022   HGBA1C 5.6 10/04/2021    Lab Results  Component Value Date   CREATININE 0.79 09/25/2023   CREATININE 0.76 09/26/2022   CREATININE 0.80 10/04/2021    Lab Results  Component Value Date   WBC 6.5 04/18/2023   HGB 13.6 04/18/2023   HCT 41.1 04/18/2023   PLT 315.0 04/18/2023   GLUCOSE 92 09/25/2023   CHOL 187 09/25/2023   TRIG 60.0 09/25/2023   HDL 53.30 09/25/2023  LDLDIRECT 126.0 09/25/2023   LDLCALC 121 (H) 09/25/2023   ALT 17 09/25/2023   AST 21 09/25/2023   NA 137 09/25/2023   K 4.2 09/25/2023   CL 102 09/25/2023   CREATININE 0.79 09/25/2023   BUN 16 09/25/2023   CO2 28 09/25/2023   TSH 2.03 09/25/2023   HGBA1C 5.5 09/25/2023    MM 3D SCREENING MAMMOGRAM BILATERAL BREAST Result Date: 11/01/2023 CLINICAL DATA:  Screening. EXAM: DIGITAL SCREENING BILATERAL MAMMOGRAM WITH TOMOSYNTHESIS AND CAD TECHNIQUE: Bilateral screening digital craniocaudal and mediolateral oblique mammograms were obtained. Bilateral screening digital breast tomosynthesis was performed. The images were evaluated with computer-aided detection. COMPARISON:  Previous exam(s). ACR Breast Density Category b: There  are scattered areas of fibroglandular density. FINDINGS: There are no findings suspicious for malignancy. IMPRESSION: No mammographic evidence of malignancy. A result letter of this screening mammogram will be mailed directly to the patient. RECOMMENDATION: Screening mammogram in one year. (Code:SM-B-01Y) BI-RADS CATEGORY  1: Negative. Electronically Signed   By: Delon Music M.D.   On: 11/01/2023 08:39    Assessment & Plan:  .There are no diagnoses linked to this encounter.   I spent 34 minutes on the day of this face to face encounter reviewing patient's  most recent visit with cardiology,  nephrology,  and neurology,  prior relevant surgical and non surgical procedures, recent  labs and imaging studies, counseling on weight management,  reviewing the assessment and plan with patient, and post visit ordering and reviewing of  diagnostics and therapeutics with patient  .   Follow-up: No follow-ups on file.   Verneita LITTIE Kettering, MD

## 2024-04-24 NOTE — Patient Instructions (Addendum)
 You do have a large non thrombosed external hemorrhoid and a small internal hemorrhoid.  Neither are bleeding currently.  I have send Anusol HC suppositories and cream to [facility]  I have also sent fluconazole  to take for 2 days for the vaginal itching  I appreciate your concern about resuming a  PPI , but you need to get your symptoms under control.     Please take Prevacid once daily for 2 weeks to get your reflux under control, then switch to famotidine (generic Pepcid  20 mg ) once daily . Famotidine is an  H2 blocker, available without a prescription.   if your reflux symptoms are controlled,  You can Continue the daily h2 blocker.    Referral to [facility]  in process

## 2024-04-26 DIAGNOSIS — Z800 Family history of malignant neoplasm of digestive organs: Secondary | ICD-10-CM | POA: Insufficient documentation

## 2024-04-26 NOTE — Assessment & Plan Note (Signed)
 Referring to GI for colonoscopy.  Anusol HC prescribed

## 2024-04-26 NOTE — Assessment & Plan Note (Signed)
 Referring to [facility] for colonoscopy

## 2024-04-27 ENCOUNTER — Ambulatory Visit: Admitting: Physical Therapy

## 2024-05-04 ENCOUNTER — Ambulatory Visit: Admitting: Physical Therapy

## 2024-05-07 ENCOUNTER — Ambulatory Visit (INDEPENDENT_AMBULATORY_CARE_PROVIDER_SITE_OTHER): Admitting: Adult Health

## 2024-05-07 VITALS — BP 142/86 | HR 89

## 2024-05-07 DIAGNOSIS — G43709 Chronic migraine without aura, not intractable, without status migrainosus: Secondary | ICD-10-CM | POA: Diagnosis not present

## 2024-05-07 MED ORDER — ONABOTULINUMTOXINA 200 UNITS IJ SOLR
155.0000 [IU] | Freq: Once | INTRAMUSCULAR | Status: AC
  Administered 2024-05-07: 155 [IU] via INTRAMUSCULAR

## 2024-05-07 NOTE — Progress Notes (Signed)
 Botox - 200 units x 1 vial Lot: [id number] Expiration: 2027/6 NDC: 0023-3921-02   Bacteriostatic 0.9% Sodium Chloride - 4 mL  Lot: [id number] Expiration: 05/08/25 NDC: 959083397   Dx: H56.290 B/B Witnessed by: Diandra CMA

## 2024-05-07 NOTE — Progress Notes (Signed)
 Update 05/07/2024 JM: Returns for repeat Botox .  Prior Botox  02/13/2024.  Reports continued benefit with Botox .  Can have occasional migraines but typically resolve after use of rizatriptan  and overall very well-controlled with >50% migraine reduction on botox .  Tolerated procedure well.  Return in 3 months for repeat injection. She was      Consent Form Botulism Toxin Injection For Chronic Migraine    Reviewed orally with patient, additionally signature is on file:  Botulism toxin has been approved by the Federal drug administration for treatment of chronic migraine. Botulism toxin does not cure chronic migraine and it may not be effective in some patients.  The administration of botulism toxin is accomplished by injecting a small amount of toxin into the muscles of the neck and head. Dosage must be titrated for each individual. Any benefits resulting from botulism toxin tend to wear off after 3 months with a repeat injection required if benefit is to be maintained. Injections are usually done every 3-4 months with maximum effect peak achieved by about 2 or 3 weeks. Botulism toxin is expensive and you should be sure of what costs you will incur resulting from the injection.  The side effects of botulism toxin use for chronic migraine may include:   -Transient, and usually mild, facial weakness with facial injections  -Transient, and usually mild, head or neck weakness with head/neck injections  -Reduction or loss of forehead facial animation due to forehead muscle weakness  -Eyelid drooping  -Dry eye  -Pain at the site of injection or bruising at the site of injection  -Double vision  -Potential unknown long term risks   Contraindications: You should not have Botox  if you are pregnant, nursing, allergic to albumin, have an infection, skin condition, or muscle weakness at the site of the injection, or have myasthenia gravis, Lambert-Eaton syndrome, or ALS.  It is also possible  that as with any injection, there may be an allergic reaction or no effect from the medication. Reduced effectiveness after repeated injections is sometimes seen and rarely infection at the injection site may occur. All care will be taken to prevent these side effects. If therapy is given over a long time, atrophy and wasting in the muscle injected may occur. Occasionally the patient's become refractory to treatment because they develop antibodies to the toxin. In this event, therapy needs to be modified.  I have read the above information and consent to the administration of botulism toxin.    BOTOX  PROCEDURE NOTE FOR MIGRAINE HEADACHE  Contraindications and precautions discussed with patient(above). Aseptic procedure was observed and patient tolerated procedure. Procedure performed by Harlene Bogaert, AGNP-BC.   The condition has existed for more than 6 months, and pt does not have a diagnosis of ALS, Myasthenia Gravis or Lambert-Eaton Syndrome.  Risks and benefits of injections discussed and pt agrees to proceed with the procedure.  Written consent obtained  These injections are medically necessary. Pt  receives good benefits from these injections. These injections do not cause sedations or hallucinations which the oral therapies may cause.   Description of procedure:  The patient was placed in a sitting position. The standard protocol was used for Botox  as follows, with 5 units of Botox  injected at each site:  -Procerus muscle, midline injection  -Corrugator muscle, bilateral injection  -Frontalis muscle, bilateral injection, with 2 sites each side, medial injection was performed in the upper one third of the frontalis muscle, in the region vertical from the medial inferior edge  of the superior orbital rim. The lateral injection was again in the upper one third of the forehead vertically above the lateral limbus of the cornea, 1.5 cm lateral to the medial injection site.  -Temporalis  muscle injection, 4 sites, bilaterally. The first injection was 3 cm above the tragus of the ear, second injection site was 1.5 cm to 3 cm up from the first injection site in line with the tragus of the ear. The third injection site was 1.5-3 cm forward between the first 2 injection sites. The fourth injection site was 1.5 cm posterior to the second injection site. 5th site laterally in the temporalis  muscleat the level of the outer canthus.  -Occipitalis muscle injection, 3 sites, bilaterally. The first injection was done one half way between the occipital protuberance and the tip of the mastoid process behind the ear. The second injection site was done lateral and superior to the first, 1 fingerbreadth from the first injection. The third injection site was 1 fingerbreadth superiorly and medially from the first injection site.  -Cervical paraspinal muscle injection, 2 sites, bilaterally. The first injection site was 1 cm from the midline of the cervical spine, 3 cm inferior to the lower border of the occipital protuberance. The second injection site was 1.5 cm superiorly and laterally to the first injection site.  -Trapezius muscle injection was performed at 3 sites, bilaterally. The first injection site was in the upper trapezius muscle halfway between the inflection point of the neck, and the acromion. The second injection site was one half way between the acromion and the first injection site. The third injection was done between the first injection site and the inflection point of the neck.    A total of 200 units of Botox  was prepared, 155 units of Botox  was injected as documented above, any Botox  not injected was wasted. The patient tolerated the procedure well, there were no complications of the above procedure.   Harlene Bogaert, AGNP-BC  [facility] [address] [place], [place] [address]  Phone [phone] Fax [phone] Note: This document was  prepared with digital dictation and possible smart phrase technology. Any transcriptional errors that result from this process are unintentional.

## 2024-05-11 ENCOUNTER — Ambulatory Visit: Attending: Obstetrics and Gynecology | Admitting: Physical Therapy

## 2024-05-11 DIAGNOSIS — M542 Cervicalgia: Secondary | ICD-10-CM | POA: Diagnosis present

## 2024-05-11 DIAGNOSIS — M545 Low back pain, unspecified: Secondary | ICD-10-CM | POA: Insufficient documentation

## 2024-05-11 DIAGNOSIS — G8929 Other chronic pain: Secondary | ICD-10-CM | POA: Insufficient documentation

## 2024-05-11 DIAGNOSIS — M533 Sacrococcygeal disorders, not elsewhere classified: Secondary | ICD-10-CM | POA: Diagnosis present

## 2024-05-11 NOTE — Therapy (Signed)
 OUTPATIENT PHYSICAL THERAPY  TREATMENT / RECERT   Patient Name: Brianna Calhoun MRN: [id number] DOB:[birth year], 68 y.o., female Today's Date: 05/11/2024   PT End of Session - 05/11/24 0859     Visit Number 7    Number of Visits 17    Date for Recertification  07/20/24    PT Start Time 0850    PT Stop Time 0930    PT Time Calculation (min) 40 min    Activity Tolerance Patient tolerated treatment well;No increased pain    Behavior During Therapy [facility] for tasks assessed/performed            Past Medical History:  Diagnosis Date   Actinic keratosis    Basal cell carcinoma 04/13/2008   Right temple   Basal cell carcinoma 10/07/2019   Right chest   Basal cell carcinoma (BCC) of face 03/08/2009   Right temple, recurrent   Basal cell carcinoma (BCC) of face 11/28/2015   Right temple, hairline. Nodular pattern   Basal cell carcinoma of trunk 03/29/2014   Right paraspinal upper back. Superficial    Cancer (HCC)    skin   GERD (gastroesophageal reflux disease)    Hyperlipidemia    Migraine    Squamous cell carcinoma of skin 05/24/2021   Left chest [facility]   Past Surgical History:  Procedure Laterality Date   CERVICAL CERCLAGE     6/84, 1/90, 8/93 under epidurals   CESAREAN SECTION  1984, 1986, 1990, 1994   CHOLECYSTECTOMY  1994   COLONOSCOPY N/A 12/13/2014   Procedure: COLONOSCOPY;  Surgeon: Lamar ONEIDA Holmes, MD;  Location: [facility] ENDOSCOPY;  Service: Endoscopy;  Laterality: N/A;   EYE SURGERY  1998   HERNIA REPAIR  1996   left hernia repair   LEFT OOPHORECTOMY Left 2005   multiple complex cysts   llq incisional hernia repair & mini-tummy tuck  07/1994   nasal surgeries     9/87, 9/00, 11/04; deviated septum, ethmoidectomy, rhinoplasty, rhinoplasty revision   Patient Active Problem List   Diagnosis Date Noted   Family history of colon cancer in father 04/26/2024   Elevated hemoglobin 10/10/2022   Osteopenia after menopause 10/04/2021   Elevated blood pressure reading in  office with white coat syndrome, without diagnosis of hypertension 10/21/2020   Perineal irritation in female 01/17/2020   Hyperplastic polyp of large intestine 01/15/2020   Diverticulosis of colon 01/15/2020   Internal hemorrhoid, bleeding 01/15/2020   Chronic migraine without aura without status migrainosus, not intractable 09/21/2019   History of migraine headaches 07/30/2019   Educated about COVID-19 virus infection 01/16/2019   Statin intolerance 08/08/2017   Encounter for preventive health examination 06/03/2017   Hyperlipidemia LDL goal <130 10/16/2016   Anxiety state 10/16/2016   Allergy to iodinated contrast media 10/16/2016    PCP: Marylynn   REFERRING PROVIDER: Verdon MART DIAG:   Rationale for Evaluation and Treatment Rehabilitation  THERAPY DIAG:  Chronic right-sided low back pain without sciatica  Sacrococcygeal disorders, not elsewhere classified    ONSET DATE:   SUBJECTIVE:               SUBJECTIVE STATEMENT TODAY:   Pt reported no RBP/ hip pain lately but is achey today  Pt has been doing her hamstring stretches Pt noticed R foot Mortion neuroma pain is better  excpet after one time when she played for 3 hours of pickleball. Pt knows she is gripping her body.  SUBJECTIVE STATEMENT ON EVAL  02/24/24: 1) In 04/2023 , pt noticed a perineal bulge when having not having a bowel movements. Pt has to use her finger to press perineum on the R side .  Then this Sx got better and started doing the past Pelvic PT exericses with deep core  and clamshells  It got better and no longer an issue at the moment .  Daily movements without straining . But when she goes on vacation, she gets constipation.    2) non-radiating  R LBP, hip pain -  Pt feels better and has no pain when she is walking or pickleball playing. When she is not active: pain is 7/10. Easing factors: stretches, massaging.  Pt stopped wearing  shoe lift for her R shorter leg in April 2025 when it got how and she shifted to wearing a sandal without a back strap when leaving the house or went barefoot in the house.    Pt plays pickleball across 4 x a time. Pt has not been doing complimentary stretches to pickleball playing as advised during her last PT sessions.     Pt walks 3 days with 3 miles. Pt does some stretches after playing pickleball but not stretches after walking.    Pt also holds her 20 lb granddtr on her L hip often .      3) tight hamstrings in both legs. 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 Yes: see above   PRECAUTIONS: None  WEIGHT BEARING RESTRICTIONS: No   FALLS:  Has patient fallen in last 6 months? 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  One is an 60 month old weighs 20 lb which she has to carry    PLOF: Independent    OBJECTIVE:     [facility] PT Assessment - 05/11/24 0902       Other:   Other/ Comments no UE support: Plantarflexion MMT 4/5,  3 reps on L, 11 reps on R      Strength   Overall Strength Comments B hip ext/ abd, knee ext/ flex 4/5 B      Palpation   SI assessment  levelled pelvis and shoulder,  R thorax shifted to the R, pain at the R iliac crest    Palpation comment tightness along plantar fascia / midfoot hypomobility, tightness along peroneal longus/ brevis L ( poan to address R LE next session)      Ambulation/Gait   Gait Comments 1.93 m/s  reciprocal gait,          [facility] Adult PT Treatment/Exercise - 05/11/24 0902       Self-Care   Other Self-Care Comments  reassessed goals , discussed regional interdependence approach to maintaining levelled spinea nd pelvic alignment  given pt's long Hx of Mortion's Neuroma, wearing flip flops , tendency for toe gripping contributing to tightness of plantar fascia/ limited plantar flexion strength which is needed for gait and pickleball playing ,  L  Plantar flexion strength weaker than R due to asymmetries in [facility] with pickleball playing      Neuro Re-ed    Neuro Re-ed Details  cued for hip hinge with propioception and tactile and visual cues for [facility] and motor control  to stretch plantar fascia and cued for less toe gripping      Manual Therapy   Manual therapy comments STM/MWM, PA/AP mobs Grade II-III at L midfoot joints to promote mobility plantar fascia / midfoot hypomobility, tightness along peroneal longus/ brevis L ( poan to address R LE next session) and promote less toe gripping with pickle ball playing and gait                HOME EXERCISE PROGRAM: See pt instruction section    ASSESSMENT:  CLINICAL IMPRESSION:                                        Pt has achieved 7/8 goals. Pt has made much improvements with non-radiating  R LBP, hip pain. Perineal bulge has improved as well.                                                   Pt no longer shows shifted thoracic alignment. Pt no longer needs shoe lift because asymmetries have been corrected at thoracic spine and not a true leg length difference. Pt is compliant with asymmetrical stretches to counteract torsion movements with pickleball playing.                                                                                                                  Today reassessed goals , discussed regional interdependence approach to maintaining levelled spine and pelvic alignment given pt's long Hx of Mortion's Neuroma, wearing flip flops , tendency for toe gripping contributing to tightness of plantar fascia/ limited plantar flexion strength which is needed for gait and pickleball playing ,  L  Plantar flexion strength weaker than R due to asymmetries in [facility] with  pickleball playing.   Currently working at [facility] deficits through regional interdependent approach to maintain structural alignment. t.  Today, focused on [facility] given pt's morton neuroma on R foot which associated with pt's compensatory strategies to bear weight along lateral foot , supination. Pt also showed toe gripping strategies and limited L plantar fascia mobility, DF/EV, hypomobilie midfoot joints . Post Tx, pt demo'd improved DF/EV, toe abduction, plantar fascia. Plan to continue to work on R foot as today's session focused on L foot .    Anticipate these improvements will promote optimize IAP system for improved pelvic floor function, trunk stability, gait, balance, stabilization with mobility tasks.  Regional interdependent approaches will yield greater benefits in pt's POC.        Plan to continue with [facility] mobility and strengthening.                                                      Pt benefits from skilled PT.    OBJECTIVE IMPAIRMENTS decreased activity tolerance, decreased coordination, decreased endurance, decreased mobility, difficulty walking, decreased ROM, decreased strength, decreased safety awareness, hypomobility, increased muscle spasms, impaired flexibility, improper body mechanics, postural dysfunction, and pain.   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 Explained muscles attachments/ connection, physiology of deep core system/ spinal- thoracic-pelvis-lower kinetic chain as they relate to pt's presentation, Sx, and past Hx. Explained what and how these areas of  deficits need to be restored to balance and function    See Therapeutic activity / neuromuscular re-education section  Answered pt's questions.   Person educated: Patient Education method: Explanation, Demonstration, Tactile cues, Verbal cues, and Handouts Education comprehension: verbalized understanding, returned demonstration, verbal cues required, tactile cues required, and needs further education     PLAN: PT FREQUENCY: 1x/week   PT DURATION: 10 weeks   PLANNED INTERVENTIONS:   Gait training;Stair training;Functional mobility training;DME Instruction;Therapeutic activities;Therapeutic exercise;Balance training;Neuromuscular re-education;Patient/family education;Vestibular;Visual/perceptual remediation/compensation;Passive range of motion;Moist Heat;Cryotherapy;Traction;Canalith Repostioning;Joint Manipulations;Manual lymph drainage;Manual techniques;Scar mobilization;Energy conservation;Dry needling;ADLs/Self Care Home Management;Biofeedback;Electrical Stimulation;Taping    PLAN FOR NEXT SESSION: See clinical impression for plan     GOALS: Goals reviewed with patient? Yes  SHORT TERM GOALS: Target date: 03/23/2024    Pt will demo IND with HEP                    Baseline: Not IND            Goal status: INITIAL   LONG TERM GOALS: Target date: 07/20/2024      1.Pt will demo proper deep core coordination without chest breathing and optimal excursion of diaphragm/pelvic floor in order to promote spinal stability and pelvic floor function  Baseline: dyscoordination Goal status: MET   2.  Pt will demo proper body mechanics in against gravity tasks and ADLs  work tasks, fitness  to minimize straining pelvic floor / back    Baseline: not IND, improper form that places strain on pelvic floor  Goal status: MET     3. Pt will demo increased gait speed > 1.3 m/s with reciprocal gait pattern, longer stride length  in order to ambulate safely in community and return to  fitness routine  Baseline:  1.16 m/s without R shoe lift, minimal trunk rotation, R thorax posterior rotation,  with shoe lift in R shoe,  0.96 m/s with shoe lift with minimal trunk rotation , R thorax posterior rotation   Goal status: MET 05/11/24:1.93 m/s reciprocal gait,     4. Pt will demo increased hip and knee strength> 4/5 bilaterally  in order continue playing pickleball and walking with less relapse of pain Baseline: knee flex/ext 3/5, R 4/5  ,  B hip flex 4/5 , R hip abd 4-/5, L 4+/5  Goal status:  MET  ( 05/11/24:  knee flex/ext, hip ext/ abd 4/5 B)    5. Pt will improve ODI  questionnaire to  pts  score change  to demo improved QOL  Baseline:    (Decreased  pts indicate greater  impact on QOL)     pts  ( total)  Goal status: Need to assess   6. Pt will be IND with flexibility routine to improve hamstring flexibility and to compliment pickleball and walking  Baseline: no flexibility routine after walking and pickleball Goal status:   MET    7.  Pt will report decreased R LBP/ hip pain < 3/10 when she is not active in order to QOL   Baseline:  R LBP/ R hip   pain:     When she is not active: pain is 7/10. Easing factors: stretches, massaging.  Goal status: MET   8.  Pt will demo increased reps > 20 reps B without UE support MMT 4/5 to promote plyometrics and push off strngth in pickleball playing without compensating for pain related to Morton's Neuroma on R foot.  Baseline: no UE support: Plantarflexion MMT 4/5, 3 reps on L, 11 reps on R  Goal Status: NEW   Pia Lupe Plump, PT 05/11/2024, 9:04 AM

## 2024-05-11 NOTE — Patient Instructions (Signed)
 Strengthening feet arches:     Heel raises - heels together, minisquat Hold onto counter   Minisquat motion, trunk bent , gaze onto floor like you are looking at your reflection over a lake/pond,  Knees bent pointed out like a "v" , navel ( center of mass) more forward  Heels together as you lift, pointed out like a "v"  KNEES ARE ALIGNED BEHIND THE TOES TO MINIMIZE STRAIN ON THE KNEES your  navel ( center of mass) more forward to a avoid dropping down fast and rocking more weight back onto heels , keep heels pressing against each other the whole time   30 reps

## 2024-05-12 ENCOUNTER — Ambulatory Visit: Admitting: Dermatology

## 2024-05-12 DIAGNOSIS — Z8589 Personal history of malignant neoplasm of other organs and systems: Secondary | ICD-10-CM

## 2024-05-12 DIAGNOSIS — Z7189 Other specified counseling: Secondary | ICD-10-CM

## 2024-05-12 DIAGNOSIS — L820 Inflamed seborrheic keratosis: Secondary | ICD-10-CM

## 2024-05-12 DIAGNOSIS — L570 Actinic keratosis: Secondary | ICD-10-CM | POA: Diagnosis not present

## 2024-05-12 DIAGNOSIS — L814 Other melanin hyperpigmentation: Secondary | ICD-10-CM | POA: Diagnosis not present

## 2024-05-12 DIAGNOSIS — L821 Other seborrheic keratosis: Secondary | ICD-10-CM

## 2024-05-12 DIAGNOSIS — Z85828 Personal history of other malignant neoplasm of skin: Secondary | ICD-10-CM

## 2024-05-12 DIAGNOSIS — W908XXA Exposure to other nonionizing radiation, initial encounter: Secondary | ICD-10-CM

## 2024-05-12 DIAGNOSIS — L578 Other skin changes due to chronic exposure to nonionizing radiation: Secondary | ICD-10-CM | POA: Diagnosis not present

## 2024-05-12 DIAGNOSIS — Z1283 Encounter for screening for malignant neoplasm of skin: Secondary | ICD-10-CM | POA: Diagnosis not present

## 2024-05-12 DIAGNOSIS — D1801 Hemangioma of skin and subcutaneous tissue: Secondary | ICD-10-CM

## 2024-05-12 DIAGNOSIS — D229 Melanocytic nevi, unspecified: Secondary | ICD-10-CM

## 2024-05-12 DIAGNOSIS — Z79899 Other long term (current) drug therapy: Secondary | ICD-10-CM

## 2024-05-12 DIAGNOSIS — Z5111 Encounter for antineoplastic chemotherapy: Secondary | ICD-10-CM

## 2024-05-12 NOTE — Patient Instructions (Addendum)
 In January   - Start 5-fluorouracil /calcipotriene cream twice a day for 4 to 7 days to affected areas including any rough areas on body / face especially spots on chest . Prescription sent to [facility]. Patient advised they will receive an email to purchase the medication online and have it sent to their home. Patient provided with handout reviewing treatment course and side effects and advised to call or message us  on MyChart with any concerns.  Reviewed course of treatment and expected reaction.  Patient advised to expect inflammation and crusting and advised that erosions are possible.  Patient advised to be diligent with sun protection during and after treatment. Counseled to keep medication out of reach of children and pets.   Instructions for [facility] Medications  One or more of your medications was sent to the [facility] mail order [facility] pharmacy. You will receive an email from them and can purchase the medicine through that link. It will then be mailed to your home at the address you confirmed. If for any reason you do not receive an email from them, please check your spam folder. If you still do not find the email, please let us  know. [facility] phone number is [phone].     5-Fluorouracil /Calcipotriene Patient Education   Actinic keratoses are the dry, red scaly spots on the skin caused by sun damage. A portion of these spots can turn into skin cancer with time, and treating them can help prevent development of skin cancer.   Treatment of these spots requires removal of the defective skin cells. There are various ways to remove actinic keratoses, including freezing with liquid nitrogen, treatment with creams, or treatment with a blue light procedure in the office.   5-fluorouracil  cream is a topical cream used to treat actinic keratoses. It works by interfering with the growth of abnormal fast-growing skin cells, such as actinic  keratoses. These cells peel off and are replaced by healthy ones.   5-fluorouracil /calcipotriene is a combination of the 5-fluorouracil  cream with a vitamin D  analog cream called calcipotriene. The calcipotriene alone does not treat actinic keratoses. However, when it is combined with 5-fluorouracil , it helps the 5-fluorouracil  treat the actinic keratoses much faster so that the same results can be achieved with a much shorter treatment time.  INSTRUCTIONS FOR 5-FLUOROURACIL /CALCIPOTRIENE CREAM:   5-fluorouracil /calcipotriene cream typically only needs to be used for 4-7 days. A thin layer should be applied twice a day to the treatment areas recommended by your physician.   If your physician prescribed you separate tubes of 5-fluourouracil and calcipotriene, apply a thin layer of 5-fluorouracil  followed by a thin layer of calcipotriene.   Avoid contact with your eyes, nostrils, and mouth. Do not use 5-fluorouracil /calcipotriene cream on infected or open wounds.   You will develop redness, irritation and some crusting at areas where you have pre-cancer damage/actinic keratoses. IF YOU DEVELOP PAIN, BLEEDING, OR SIGNIFICANT CRUSTING, STOP THE TREATMENT EARLY - you have already gotten a good response and the actinic keratoses should clear up well.  Wash your hands after applying 5-fluorouracil  5% cream on your skin.   A moisturizer or sunscreen with a minimum SPF 30 should be applied each morning.   Once you have finished the treatment, you can apply a thin layer of Vaseline twice a day to irritated areas to soothe and calm the areas more quickly. If you experience significant discomfort, contact your physician.  For some patients it is necessary to repeat the treatment for  best results.  SIDE EFFECTS: When using 5-fluorouracil /calcipotriene cream, you may have mild irritation, such as redness, dryness, swelling, or a mild burning sensation. This usually resolves within 2 weeks. The more actinic  keratoses you have, the more redness and inflammation you can expect during treatment. Eye irritation has been reported rarely. If this occurs, please let us  know.  If you have any trouble using this cream, please call the office. If you have any other questions about this information, please do not hesitate to ask me before you leave the office.     Seborrheic Keratosis  What causes seborrheic keratoses? Seborrheic keratoses are harmless, common skin growths that first appear during adult life.  As time goes by, more growths appear.  Some people may develop a large number of them.  Seborrheic keratoses appear on both covered and uncovered body parts.  They are not caused by sunlight.  The tendency to develop seborrheic keratoses can be inherited.  They vary in color from skin-colored to gray, brown, or even black.  They can be either smooth or have a rough, warty surface.   Seborrheic keratoses are superficial and look as if they were stuck on the skin.  Under the microscope this type of keratosis looks like layers upon layers of skin.  That is why at times the top layer may seem to fall off, but the rest of the growth remains and re-grows.    Treatment Seborrheic keratoses do not need to be treated, but can easily be removed in the office.  Seborrheic keratoses often cause symptoms when they rub on clothing or jewelry.  Lesions can be in the way of shaving.  If they become inflamed, they can cause itching, soreness, or burning.  Removal of a seborrheic keratosis can be accomplished by freezing, burning, or surgery. If any spot bleeds, scabs, or grows rapidly, please return to have it checked, as these can be an indication of a skin cancer.   Cryotherapy Aftercare  Wash gently with soap and water everyday.   Apply Vaseline and Band-Aid daily until healed.   Actinic keratoses are precancerous spots that appear secondary to cumulative UV radiation exposure/sun exposure over time. They are  chronic with expected duration over 1 year. A portion of actinic keratoses will progress to squamous cell carcinoma of the skin. It is not possible to reliably predict which spots will progress to skin cancer and so treatment is recommended to prevent development of skin cancer.  Recommend daily broad spectrum sunscreen SPF 30+ to sun-exposed areas, reapply every 2 hours as needed.  Recommend staying in the shade or wearing long sleeves, sun glasses (UVA+UVB protection) and wide brim hats (4-inch brim around the entire circumference of the hat). Call for new or changing lesions.    Melanoma ABCDEs  Melanoma is the most dangerous type of skin cancer, and is the leading cause of death from skin disease.  You are more likely to develop melanoma if you: Have light-colored skin, light-colored eyes, or red or blond hair Spend a lot of time in the sun Tan regularly, either outdoors or in a tanning bed Have had blistering sunburns, especially during childhood Have a close family member who has had a melanoma Have atypical moles or large birthmarks  Early detection of melanoma is key since treatment is typically straightforward and cure rates are extremely high if we catch it early.   The first sign of melanoma is often a change in a mole or a new dark spot.  The ABCDE system is a way of remembering the signs of melanoma.  A for asymmetry:  The two halves do not match. B for border:  The edges of the growth are irregular. C for color:  A mixture of colors are present instead of an even brown color. D for diameter:  Melanomas are usually (but not always) greater than 6mm - the size of a pencil eraser. E for evolution:  The spot keeps changing in size, shape, and color.  Please check your skin once per month between visits. You can use a small mirror in front and a large mirror behind you to keep an eye on the back side or your body.   If you see any new or changing lesions before your next  follow-up, please call to schedule a visit.  Please continue daily skin protection including broad spectrum sunscreen SPF 30+ to sun-exposed areas, reapplying every 2 hours as needed when you're outdoors.   Staying in the shade or wearing long sleeves, sun glasses (UVA+UVB protection) and wide brim hats (4-inch brim around the entire circumference of the hat) are also recommended for sun protection.    Due to recent changes in healthcare laws, you may see results of your pathology and/or laboratory studies on MyChart before the doctors have had a chance to review them. We understand that in some cases there may be results that are confusing or concerning to you. Please understand that not all results are received at the same time and often the doctors may need to interpret multiple results in order to provide you with the best plan of care or course of treatment. Therefore, we ask that you please give us  2 business days to thoroughly review all your results before contacting the office for clarification. Should we see a critical lab result, you will be contacted sooner.   If You Need Anything After Your Visit  If you have any questions or concerns for your doctor, please call our main line at [phone] and press option 4 to reach your doctor's medical assistant. If no one answers, please leave a voicemail as directed and we will return your call as soon as possible. Messages left after 4 pm will be answered the following business day.   You may also send us  a message via MyChart. We typically respond to MyChart messages within 1-2 business days.  For prescription refills, please ask your pharmacy to contact our office. Our fax number is [phone].  If you have an urgent issue when the clinic is closed that cannot wait until the next business day, you can page your doctor at the number below.    Please note that while we do our best to be available for urgent issues outside of office hours,  we are not available 24/7.   If you have an urgent issue and are unable to reach us , you may choose to seek medical care at your doctor's office, retail clinic, urgent care center, or emergency room.  If you have a medical emergency, please immediately call 911 or go to the emergency department.  Pager Numbers  - Dr. Hester: [phone]  - Dr. Jackquline: [phone]  - Dr. Claudene: [phone]   - Dr. Raymund: [phone]  In the event of inclement weather, please call our main line at [phone] for an update on the status of any delays or closures.  Dermatology Medication Tips: Please keep the boxes that topical medications come in in order to help keep track of the  instructions about where and how to use these. Pharmacies typically print the medication instructions only on the boxes and not directly on the medication tubes.   If your medication is too expensive, please contact our office at [phone] option 4 or send us  a message through MyChart.   We are unable to tell what your co-pay for medications will be in advance as this is different depending on your insurance coverage. However, we may be able to find a substitute medication at lower cost or fill out paperwork to get insurance to cover a needed medication.   If a prior authorization is required to get your medication covered by your insurance company, please allow us  1-2 business days to complete this process.  Drug prices often vary depending on where the prescription is filled and some pharmacies may offer cheaper prices.  The website www.goodrx.com contains coupons for medications through different pharmacies. The prices here do not account for what the cost may be with help from insurance (it may be cheaper with your insurance), but the website can give you the price if you did not use any insurance.  - You can print the associated coupon and take it with your prescription to the pharmacy.  - You may also stop by  our office during regular business hours and pick up a GoodRx coupon card.  - If you need your prescription sent electronically to a different pharmacy, notify our office through [facility] or by phone at [phone] option 4.     Si Usted Necesita Algo Despus de Su Visita  Tambin puede enviarnos un mensaje a travs de Clinical Cytogeneticist. Por lo general respondemos a los mensajes de MyChart en el transcurso de 1 a 2 das hbiles.  Para renovar recetas, por favor pida a su farmacia que se ponga en contacto con nuestra oficina. Randi [place] de fax es [place] [phone].  Si tiene un asunto urgente cuando la clnica est cerrada y que no puede esperar hasta el siguiente da hbil, puede llamar/localizar a su doctor(a) al nmero que aparece a continuacin.   Por favor, tenga en cuenta que aunque hacemos todo lo posible para estar disponibles para asuntos urgentes fuera del horario de [place], no estamos disponibles las 24 horas del da, los 7 [address]  [address] de la [place].   Si tiene un problema urgente y no puede comunicarse con nosotros, puede optar por buscar atencin mdica  en el consultorio de su doctor(a), en una clnica privada, en un centro de atencin urgente o en una sala de emergencias.  Si tiene engineer, drilling, por favor llame inmediatamente al 911 o vaya a la sala de emergencias.  Nmeros de bper  - Dr. Hester: [phone]  - Dra. Jackquline: [phone]  - Dr. Claudene: [phone]  - Dra. Kitts: [phone]  En caso de inclemencias del [place], por favor llame a nuestra lnea principal al [phone] para una actualizacin sobre el estado de cualquier retraso o cierre.  Consejos para la medicacin en dermatologa: Por favor, guarde las cajas en las que vienen los medicamentos de uso tpico para ayudarle a seguir las instrucciones sobre dnde y cmo usarlos. Las farmacias generalmente imprimen las instrucciones del medicamento slo en las cajas y no directamente en los tubos  del [place].   Si su medicamento es muy caro, por favor, pngase en contacto con landry rieger llamando al [phone] y presione la opcin 4 o envenos un mensaje a travs de Clinical Cytogeneticist.   No podemos decirle cul ser su copago por  los medicamentos por adelantado ya que esto es diferente dependiendo de la cobertura de su seguro. Sin embargo, es posible que podamos encontrar un medicamento sustituto a audiological scientist un formulario para que el seguro cubra el medicamento que se considera necesario.   Si se requiere una autorizacin previa para que su compaa de seguros cubra su medicamento, por favor permtanos de 1 a 2 das hbiles para completar este proceso.  Los precios de los medicamentos varan con frecuencia dependiendo del environmental consultant de dnde se surte la receta y alguna farmacias pueden ofrecer precios ms baratos.  El sitio web www.goodrx.com tiene cupones para medicamentos de health and safety inspector. Los precios aqu no tienen en cuenta lo que podra costar con la ayuda del seguro (puede ser ms barato con su seguro), pero el sitio web puede darle el precio si no utiliz tourist information centre manager.  - Puede imprimir el cupn correspondiente y llevarlo con su receta a la farmacia.  - Tambin puede pasar por nuestra oficina durante el horario de atencin regular y education officer, museum una tarjeta de cupones de GoodRx.  - Si necesita que su receta se enve electrnicamente a una farmacia diferente, informe a nuestra oficina a travs de MyChart de [place] o por telfono llamando al [phone] y presione la opcin 4.

## 2024-05-12 NOTE — Progress Notes (Signed)
 Follow-Up Visit   Subjective  Brianna Calhoun is a 68 y.o. female who presents for the following: Skin Cancer Screening and Full Body Skin Exam Hx of bcc, hx of scc, hx of aks, hx of isks  Reports some spots on legs and face   The patient presents for Total-Body Skin Exam (TBSE) for skin cancer screening and mole check. The patient has spots, moles and lesions to be evaluated, some may be new or changing and the patient may have concern these could be cancer.  The following portions of the chart were reviewed this encounter and updated as appropriate: medications, allergies, medical history  Review of Systems:  No other skin or systemic complaints except as noted in HPI or Assessment and Plan.  Objective  Well appearing patient in no apparent distress; mood and affect are within normal limits.  A full examination was performed including scalp, head, eyes, ears, nose, lips, neck, chest, axillae, abdomen, back, buttocks, bilateral upper extremities, bilateral lower extremities, hands, feet, fingers, toes, fingernails, and toenails. All findings within normal limits unless otherwise noted below.   Relevant physical exam findings are noted in the Assessment and Plan.  face x 10, scalp x 2, chest x 5 (17) Erythematous thin papules/macules with gritty scale.  face and neck x 15, legs and arms x 7 (22) Erythematous stuck-on, waxy papule or plaque  Assessment & Plan   HISTORY OF BASAL CELL CARCINOMA OF THE SKIN 04/13/2008 - right temple 03/08/2009 - recurrent  03/29/2014 - right paraspinal upper back - superficial  11/28/2015 - right temple hairline - nodular pattern 10/07/2019 right chest  - No evidence of recurrence today - Recommend regular full body skin exams - Recommend daily broad spectrum sunscreen SPF 30+ to sun-exposed areas, reapply every 2 hours as needed.  - Call if any new or changing lesions are noted between office visits    HISTORY OF SQUAMOUS CELL CARCINOMA OF THE  SKIN 05/24/2021 - left chest - ED&C - No evidence of recurrence today - No lymphadenopathy - Recommend regular full body skin exams - Recommend daily broad spectrum sunscreen SPF 30+ to sun-exposed areas, reapply every 2 hours as needed.  - Call if any new or changing lesions are noted between office visits   SKIN CANCER SCREENING PERFORMED TODAY.   LENTIGINES, SEBORRHEIC KERATOSES, HEMANGIOMAS - Benign normal skin lesions - Benign-appearing - Call for any changes  MELANOCYTIC NEVI - Tan-brown and/or pink-flesh-colored symmetric macules and papules - Benign appearing on exam today - Observation - Call clinic for new or changing moles - Recommend daily use of broad spectrum spf 30+ sunscreen to sun-exposed areas.   Hemangioma at left chest  Benign-appearing.  Observation.  Call clinic for new or changing lesions.  Recommend daily use of broad spectrum spf 30+ sunscreen to sun-exposed areas.   ACTINIC KERATOSIS (17) face x 10, scalp x 2, chest x 5 (17)  In January   - Start 5-fluorouracil /calcipotriene cream twice a day for 4 to 7 days to affected areas including any rough areas on body / face especially spots on chest . Prescription sent to [facility]. Patient advised they will receive an email to purchase the medication online and have it sent to their home. Patient provided with handout reviewing treatment course and side effects and advised to call or message us  on MyChart with any concerns.  Reviewed course of treatment and expected reaction.  Patient advised to expect inflammation and crusting and advised that erosions  are possible.  Patient advised to be diligent with sun protection during and after treatment. Counseled to keep medication out of reach of children and pets.   ACTINIC DAMAGE WITH PRECANCEROUS ACTINIC KERATOSES Counseling for Topical Chemotherapy Management: Patient exhibits: - Severe, confluent actinic changes with pre-cancerous  actinic keratoses that is secondary to cumulative UV radiation exposure over time - Condition that is severe; chronic, not at goal. - diffuse scaly erythematous macules and papules with underlying dyspigmentation - Discussed Prescription Field Treatment topical Chemotherapy for Severe, Chronic Confluent Actinic Changes with Pre-Cancerous Actinic Keratoses Field treatment involves treatment of an entire area of skin that has confluent Actinic Changes (Sun/ Ultraviolet light damage) and PreCancerous Actinic Keratoses by method of PhotoDynamic Therapy (PDT) and/or prescription Topical Chemotherapy agents such as 5-fluorouracil , 5-fluorouracil /calcipotriene, and/or imiquimod.  The purpose is to decrease the number of clinically evident and subclinical PreCancerous lesions to prevent progression to development of skin cancer by chemically destroying early precancer changes that may or may not be visible.  It has been shown to reduce the risk of developing skin cancer in the treated area. As a result of treatment, redness, scaling, crusting, and open sores may occur during treatment course. One or more than one of these methods may be used and may have to be used several times to control, suppress and eliminate the PreCancerous changes. Discussed treatment course, expected reaction, and possible side effects. - Recommend daily broad spectrum sunscreen SPF 30+ to sun-exposed areas, reapply every 2 hours as needed.  - Staying in the shade or wearing long sleeves, sun glasses (UVA+UVB protection) and wide brim hats (4-inch brim around the entire circumference of the hat) are also recommended. - Call for new or changing lesions.  Actinic keratoses are precancerous spots that appear secondary to cumulative UV radiation exposure/sun exposure over time. They are chronic with expected duration over 1 year. A portion of actinic keratoses will progress to squamous cell carcinoma of the skin. It is not possible to  reliably predict which spots will progress to skin cancer and so treatment is recommended to prevent development of skin cancer.  Recommend daily broad spectrum sunscreen SPF 30+ to sun-exposed areas, reapply every 2 hours as needed.  Recommend staying in the shade or wearing long sleeves, sun glasses (UVA+UVB protection) and wide brim hats (4-inch brim around the entire circumference of the hat). Call for new or changing lesions. Destruction of lesion - face x 10, scalp x 2, chest x 5 (17) Complexity: simple   Destruction method: cryotherapy   Informed consent: discussed and consent obtained   Timeout:  patient name, date of birth, surgical site, and procedure verified Lesion destroyed using liquid nitrogen: Yes   Region frozen until ice ball extended beyond lesion: Yes   Outcome: patient tolerated procedure well with no complications   Post-procedure details: wound care instructions given    INFLAMED SEBORRHEIC KERATOSIS (22) face and neck x 15, legs and arms x 7 (22) Symptomatic, irritating, patient would like treated. Destruction of lesion - face and neck x 15, legs and arms x 7 (22) Complexity: simple   Destruction method: cryotherapy   Informed consent: discussed and consent obtained   Timeout:  patient name, date of birth, surgical site, and procedure verified Lesion destroyed using liquid nitrogen: Yes   Region frozen until ice ball extended beyond lesion: Yes   Outcome: patient tolerated procedure well with no complications   Post-procedure details: wound care instructions given    Return in about 6  months (around 11/09/2024) for tbse .  IEleanor Blush, CMA, am acting as scribe for Alm Rhyme, MD.   Documentation: I have reviewed the above documentation for accuracy and completeness, and I agree with the above.  Alm Rhyme, MD

## 2024-05-18 ENCOUNTER — Encounter: Payer: Self-pay | Admitting: Dermatology

## 2024-05-20 ENCOUNTER — Ambulatory Visit: Admitting: Neurology

## 2024-05-20 ENCOUNTER — Encounter: Payer: Self-pay | Admitting: Neurology

## 2024-05-20 ENCOUNTER — Ambulatory Visit (INDEPENDENT_AMBULATORY_CARE_PROVIDER_SITE_OTHER): Admitting: Neurology

## 2024-05-20 VITALS — BP 131/73 | HR 72 | Ht 65.0 in | Wt 140.4 lb

## 2024-05-20 DIAGNOSIS — G43709 Chronic migraine without aura, not intractable, without status migrainosus: Secondary | ICD-10-CM

## 2024-05-20 NOTE — Progress Notes (Signed)
 Subjective:    Patient ID: Brianna Calhoun is a 68 y.o. female.  HPI    True Mar, MD, PhD [facility] [address], [address] [address] [place], [place] [address]  Brianna Calhoun is a 68 year old female with an underlying medical history of migraine headaches, actinic keratosis, basal cell cancer, reflux disease, and hyperlipidemia, who presents for follow-up consultation of her chronic migraines.  The patient is unaccompanied today.  She has previously seen Dr. Onetha Epp in this clinic and has been receiving regular Botox  injections through this office, last injection with Harlene Bogaert, NP on 05/07/2024.  I reviewed prior records including Botox  injection notes as well as office visit notes and copied some notes below for reference.  Today, 05/20/2024: She reports doing fairly well with her Botox  injections.  She has tolerated them and finds them effective.  She takes Maxalt  about 4 times a month, usually starts with just 5 mg, half a pill and takes the other half if need be.  It helps to lie down in a dark room with a cold washcloth.  She has a prior prescription for Toradol  injection which she has used occasionally.  She is a retired engineer, civil (consulting), she lives with her husband who is a retired investment banker, operational.  They have 4 grown children and 12 grandchildren and help out with the grandkids.  She is very active physically and exercises regularly, plays pickle ball.  She tries to hydrate well with water and limits her caffeine to 1 cup of coffee per day on average.  She drinks alcohol occasionally to rarely, up to 4 glasses a month on average.  She quit smoking over 40 years ago.   She is currently on no additional oral preventative medication.  She does take Maxalt  as needed and Zofran  as needed, prescriptions through PCP.  She has also taken Robaxin  as needed and Toradol  as needed in the past. She is scheduled in January for her repeat Botox  injection with Harlene Bogaert,  NP. One of her daughters has migraine headaches.  Her grandmother may have had migraines as well.  Previously:  05/07/2024 Young Bogaert, NP): <<Update 05/07/2024 JM: Returns for repeat Botox .  Prior Botox  02/13/2024.  Reports continued benefit with Botox .  Can have occasional migraines but typically resolve after use of rizatriptan  and overall very well-controlled with >50% migraine reduction on botox .  Tolerated procedure well.  Return in 3 months for repeat injection.>>  02/13/2024 Young Bogaert, NP): <<Returns for repeat Botox .  Prior Botox  11/21/2023.  Reports continued benefit with Botox . Did have a migraine a couple weeks ago but overall still very well controlled. Use of rizatriptan  with benefit. Tolerated procedure well.  Return in 3 months for repeat injection.>>  11/21/2023 Young Bogaert, NP): <<Returns for repeat Botox .  Prior Botox  08/15/2023 with Dr. Epp.  Reports continued benefit with Botox .  Can have a couple headaches per week but not migrainous, usually about 4 migraines per month, use of rizatriptan  with benefit.  She does have increased neck stiffness/tension on right side which is the side of her migraines.  Tolerated procedure well.  Return in 3 months for repeat injection.>>  08/15/2023 (Dr. Onetha Epp): <<reviewed the botox  protocol with patient as above. Transition to NP.   >>  09/21/2019 (initial visit note from Dr. Epp): <<HPI:  Brianna Calhoun is a 68 y.o. female here as requested by Marylynn Verneita LITTIE, MD for migraine headaches.  Past medical history hyperlipidemia, prediabetes, recurrent migraine headaches.  I reviewed Dr. Lula notes, she has been using Robaxin  and tramadol .  For headache, almost daily.  She has a history of migraines.  Also chronic neck pain and other headaches as well.  She was referred to neurology for consideration of Botox  injections to reduce frequency of migraines.    Patient is here for migraines. She has been to [facility] for trigger point  injections for her cervical muscle pain. She has a knot in the neck that gets inflamed, then she gets pain in the right shoulder and it travels up the right side of the head to behind the eyes, pulsating/pounding/throbbing, no aura, smell sensitivity is terrible if she puts a cream on it will trigger it, she has photophobia/phonophobia,nausea. She has shoulder and right-sided neck pain all the time, she has at least 15 migraine days a month that last 24-72 hours. She used to get massages. She has never had dry needling. No aura. No medication overuse. 15 moderately severe to severe migraines a month for > 1 year. No other focal neurologic deficits, associated symptoms, inciting events or modifiable factors.   Reviewed notes, labs and imaging from outside physicians, which showed:   Medications tried: Robaxin , Zofran , Maxalt , tramadol , Mobic, meclizine , prednisone ,alleve, advil, fiorinol, flexeril(caused sedation), maxalt  and imitrex make her feel strange. Propranolol with side effects, topiramate >>  Her Past Medical History Is Significant For: Past Medical History:  Diagnosis Date   Actinic keratosis    Basal cell carcinoma 04/13/2008   Right temple   Basal cell carcinoma 10/07/2019   Right chest   Basal cell carcinoma (BCC) of face 03/08/2009   Right temple, recurrent   Basal cell carcinoma (BCC) of face 11/28/2015   Right temple, hairline. Nodular pattern   Basal cell carcinoma of trunk 03/29/2014   Right paraspinal upper back. Superficial    Cancer (HCC)    skin   GERD (gastroesophageal reflux disease)    Hyperlipidemia    Migraine    Squamous cell carcinoma of skin 05/24/2021   Left chest EDC    Her Past Surgical History Is Significant For: Past Surgical History:  Procedure Laterality Date   CERVICAL CERCLAGE     6/84, 1/90, 8/93 under epidurals   CESAREAN SECTION  1984, 1986, 1990, 1994   CHOLECYSTECTOMY  1994   COLONOSCOPY N/A 12/13/2014   Procedure: COLONOSCOPY;   Surgeon: Lamar ONEIDA Holmes, MD;  Location: [facility] ENDOSCOPY;  Service: Endoscopy;  Laterality: N/A;   EYE SURGERY  1998   HERNIA REPAIR  1996   left hernia repair   LEFT OOPHORECTOMY Left 2005   multiple complex cysts   llq incisional hernia repair & mini-tummy tuck  07/1994   nasal surgeries     9/87, 9/00, 11/04; deviated septum, ethmoidectomy, rhinoplasty, rhinoplasty revision    Her Family History Is Significant For: Family History  Problem Relation Age of Onset   Alcohol abuse Mother    Arthritis Mother    Skin cancer Mother    COPD Mother    Depression Mother    Diabetes Mother    Hearing loss Mother    Heart disease Mother    Hyperlipidemia Mother    Hypertension Mother    Kidney disease Mother    Vision loss Mother    Colon cancer Father    Diabetes Father    Hyperlipidemia Father    Hypertension Father    Mental illness Sister    Hyperlipidemia Brother    Hypertension Brother    Diabetes Brother  Heart disease Brother    Stroke Brother 40   Cancer Brother 70       bladder cancer   Migraines Maternal Grandmother        had them horribly   Stroke Maternal Grandfather    Liver disease Paternal Grandfather    Migraines Daughter    Breast cancer Maternal Aunt 65   Seizures Neg Hx     Her Social History Is Significant For: Social History   Socioeconomic History   Marital status: Married    Spouse name: Not on file   Number of children: 4   Years of education: Not on file   Highest education level: Bachelor's degree (e.g., BA, AB, BS)  Occupational History   Not on file  Tobacco Use   Smoking status: Former    Current packs/day: 0.00    Types: Cigarettes    Quit date: 1980    Years since quitting: 45.8   Smokeless tobacco: Never  Vaping Use   Vaping status: Never Used  Substance and Sexual Activity   Alcohol use: Yes    Alcohol/week: 2.0 - 4.0 standard drinks of alcohol    Types: 2 - 4 Glasses of wine per week    Comment: monthly   Drug use:  No   Sexual activity: Yes  Other Topics Concern   Not on file  Social History Narrative   Lives at home with husband    Right handed   Caffeine; 2-3 servings a day   Social Drivers of Corporate Investment Banker Strain: Low Risk  (04/22/2024)   Overall Financial Resource Strain (CARDIA)    Difficulty of Paying Living Expenses: Not hard at all  Food Insecurity: No Food Insecurity (04/22/2024)   Hunger Vital Sign    Worried About Running Out of Food in the Last Year: Never true    Ran Out of Food in the Last Year: Never true  Transportation Needs: No Transportation Needs (04/22/2024)   PRAPARE - Administrator, Civil Service (Medical): No    Lack of Transportation (Non-Medical): No  Physical Activity: Sufficiently Active (05/13/2024)   [facility]   Exercise Vital Sign    On average, how many days per week do you engage in moderate to strenuous exercise (like a brisk walk)?: 4 days    On average, how many minutes do you engage in exercise at this level?: 50 Calhoun  Stress: No Stress Concern Present (04/22/2024)   [employer] of Occupational Health - Occupational Stress Questionnaire    Feeling of Stress: Only a little  Social Connections: Socially Integrated (04/22/2024)   Social Connection and Isolation Panel    Frequency of Communication with Friends and Family: More than three times a week    Frequency of Social Gatherings with Friends and Family: More than three times a week    Attends Religious Services: More than 4 times per year    Active Member of [employer] or Organizations: Yes    Attends Engineer, Structural: More than 4 times per year    Marital Status: Married    Her Allergies Are:  Allergies  Allergen Reactions   Amoxicillin Rash   Rosuvastatin  Other (See Comments)    Myalgia,s brain fog    Contrast Media [Iodinated Contrast Media] Hives and Rash    Break through reaction with pre-medication - Hives  :   Her Current  Medications Are:  Outpatient Encounter Medications as of 05/20/2024  Medication Sig  acetaminophen (TYLENOL) 325 MG tablet Take 650 mg by mouth every 6 (six) hours as needed.   ALPRAZolam  (XANAX ) 0.25 MG tablet TAKE 1/2 TABLET BY MOUTH AT BEDTIME AS NEEDED FOR ANXIETY   estradiol (ESTRACE) 0.1 MG/GM vaginal cream Place vaginally.   hydrocortisone (ANUSOL-HC) 2.5 % rectal cream Place 1 Application rectally 2 (two) times daily.   hydrocortisone (ANUSOL-HC) 25 MG suppository Place 1 suppository (25 mg total) rectally 2 (two) times daily.   ketorolac  (TORADOL ) 60 MG/2ML SOLN injection Inject 1-2ml (30-60mg ) intramuscularly at onset of migraine. May repeat in 6 hours. Max twice a day and 4 days per month.   lansoprazole (PREVACID) 30 MG capsule Take 1 capsule (30 mg total) by mouth daily at 12 noon.   methocarbamol  (ROBAXIN ) 500 MG tablet Take 1 tablet (500 mg total) by mouth every 6 (six) hours as needed for muscle spasms.   Olopatadine HCl (PATADAY OP) Apply to eye.   ondansetron  (ZOFRAN ) 4 MG tablet Take 1 tablet (4 mg total) by mouth every 8 (eight) hours as needed for nausea or vomiting.   OVER THE COUNTER MEDICATION Vitamin B, C, D3 5,000 in/K81mcg 2-3 times per week   Probiotic Product (PROBIOTIC PO) Take 30-50,000,000 Units by mouth at bedtime.   rizatriptan  (MAXALT -MLT) 10 MG disintegrating tablet TAKE ONE TABLET AS NEEDED FOR MIGRAINE. MAY REPEAT IN 2 HOURS IF NEEDED.   SYRINGE-NEEDLE, DISP, 3 ML (BD SAFETYGLIDE SYRINGE/NEEDLE) 25G X 1 3 ML MISC Attach needle to syringe and use to draw up and administer Toradol . Do not reuse.   TRYPTYR 0.003 % SOLN Apply 1 drop to eye 2 (two) times daily.   VEVYE 0.1 % SOLN Apply 1 drop to eye 2 (two) times daily.   No facility-administered encounter medications on file as of 05/20/2024.  :   Review of Systems:  Out of a complete 14 point review of systems, all are reviewed and negative with the exception of these symptoms as listed below:   Review  of Systems  Objective:  Neurological Exam  Physical Exam Physical Examination:   Vitals:   05/20/24 0919  BP: 131/73  Pulse: 72    General Examination: The patient is a very pleasant 68 y.o. female in no acute distress. She appears well-developed and well-nourished and well groomed.   HEENT: Normocephalic, atraumatic, pupils are equal, round and reactive to light, extraocular tracking is good without limitation to gaze excursion or nystagmus noted. No photophobia.  Hearing is grossly intact.  Face is symmetric with normal facial animation. Speech is clear without dysarthria. There is no hypophonia. There is no lip, neck/head, jaw or voice tremor. Neck is supple with full range of passive and active motion. There are no carotid bruits on auscultation.   Chest: Clear to auscultation without wheezing, rhonchi or crackles noted.  Heart: S1+S2+0, regular and normal without murmurs, rubs or gallops noted.   Abdomen: Soft, non-tender and non-distended.  Extremities: There is no pitting edema in the distal lower extremities bilaterally.   Skin: Warm and dry without trophic changes noted.   Musculoskeletal: exam reveals no obvious joint deformities.   Neurologically:  Mental status: The patient is awake, alert and oriented in all 4 spheres. Her immediate and remote memory, attention, language skills and fund of knowledge are appropriate. There is no evidence of aphasia, agnosia, apraxia or anomia. Speech is clear with normal prosody and enunciation. Thought process is linear. Mood is normal and affect is normal.  Cranial nerves II - XII are as  described above under HEENT exam.  Motor exam: Normal bulk, moving all 4 extremities without restriction, no obvious action or resting tremor.  Fine motor skills and coordination: Intact grossly in the upper and lower extremities. Reflexes 1+ throughout, toes are downgoing bilaterally.  Cerebellar testing: No dysmetria or intention tremor. There is  no truncal or gait ataxia.  Sensory exam: intact to light touch in the upper and lower extremities.  Gait, station and balance: She stands easily. No veering to one side is noted. No leaning to one side is noted. Posture is age-appropriate and stance is narrow based. Gait shows normal stride length and normal pace. No problems turning are noted.   Assessment and Plan:  In summary, Brianna Calhoun is a 68 year old female with an underlying medical history of migraine headaches, actinic keratosis, basal cell cancer, reflux disease, and hyperlipidemia, who presents for follow-up consultation of her chronic migraines.  She is currently on Botox  injections with good results and good tolerance reported.  She is scheduled for January 2026 again.  She has not been on the once monthly subcu injections such as Emgality, Aimovig or Ajovy, this could be a consideration down the road even in conjunction with Botox  injections.  She can continue with Maxalt  as needed and is typically up-to-date with her prescription through PCP.  She has a prior prescription for Toradol  injections IM through her previous provider in this clinic.  I advised the patient that I typically do not prescribe IM Toradol  but since she has some left she can certainly use it up.  She is advised to follow-up routinely in this clinic every 90 days for Botox  injections with the nurse practitioner, we can follow-up for a visit in 1 year as well.  I answered all her questions today and she was in agreement with our plan.   I spent 40 minutes in total face-to-face time and in reviewing records during pre-charting, more than 50% of which was spent in counseling and coordination of care, reviewing test results, reviewing medications and treatment regimen and/or in discussing or reviewing the diagnosis of chronic migraines, the prognosis and treatment options. Pertinent laboratory and imaging test results that were available during this visit with the patient  were reviewed by me and considered in my medical decision making (see chart for details).

## 2024-05-20 NOTE — Patient Instructions (Addendum)
 It was nice to meet you today.  I am glad to hear that Botox  has worked well for you.  Please follow up regularly for you botox  injections with Harlene.  Continue with Maxalt  as needed.  As explained, I do not prescribe IM Toradol  for my patients typically.  You can use up the Toradol  you have from your previous provider in this clinic.  We can consider you for one of them once monthly migraine preventative injections such as Aimovig, Ajovy or Emgality subcutaneously injected every 30 days, you can read up on these medications online for now and if need be we can add these on top of your regular migraine Botox  injections in this clinic. You can schedule a 1 year follow up with me.

## 2024-05-27 ENCOUNTER — Ambulatory Visit: Admitting: Physical Therapy

## 2024-06-08 ENCOUNTER — Telehealth: Payer: Self-pay | Admitting: Neurology

## 2024-06-08 NOTE — Telephone Encounter (Signed)
 Request to r/s upcoming Botox  appointment, call routed to coordinator

## 2024-06-09 ENCOUNTER — Ambulatory Visit: Admitting: Internal Medicine

## 2024-07-06 ENCOUNTER — Other Ambulatory Visit: Payer: Self-pay | Admitting: Internal Medicine

## 2024-07-07 ENCOUNTER — Ambulatory Visit: Admitting: Physical Therapy

## 2024-07-23 ENCOUNTER — Encounter: Payer: Self-pay | Admitting: Internal Medicine

## 2024-07-24 ENCOUNTER — Other Ambulatory Visit (HOSPITAL_COMMUNITY)
Admission: RE | Admit: 2024-07-24 | Discharge: 2024-07-24 | Disposition: A | Source: Ambulatory Visit | Attending: Nurse Practitioner | Admitting: Nurse Practitioner

## 2024-07-24 ENCOUNTER — Encounter: Payer: Self-pay | Admitting: Nurse Practitioner

## 2024-07-24 ENCOUNTER — Ambulatory Visit (INDEPENDENT_AMBULATORY_CARE_PROVIDER_SITE_OTHER): Admitting: Nurse Practitioner

## 2024-07-24 VITALS — BP 134/74 | HR 80 | Resp 16 | Ht 65.0 in | Wt 140.2 lb

## 2024-07-24 DIAGNOSIS — N898 Other specified noninflammatory disorders of vagina: Secondary | ICD-10-CM | POA: Diagnosis present

## 2024-07-24 MED ORDER — NYSTATIN-TRIAMCINOLONE 100000-0.1 UNIT/GM-% EX CREA: 1.0000 | TOPICAL_CREAM | Freq: Two times a day (BID) | CUTANEOUS | 0 refills | Status: AC

## 2024-07-24 NOTE — Progress Notes (Signed)
 "  BP 134/74   Pulse 80   Resp 16   Ht 5' 5 (1.651 m)   Wt 140 lb 3.2 oz (63.6 kg)   SpO2 97%   BMI 23.33 kg/m    Subjective:    Patient ID: Brianna Calhoun, female    DOB: 07-Oct-1955, 69 y.o.   MRN: [id number]  HPI: Brianna Calhoun is a 69 y.o. female  Chief Complaint  Patient presents with   Vaginal Itching    Near clitoris, wants provider to exam   Discussed the use of AI scribe software for clinical note transcription with the patient, who gave verbal consent to proceed.  History of Present Illness Brianna Calhoun is a 69 year old female who presents with recurrent clitoral itching and suspected yeast infections.  Clitoral and perineal pruritus - Recurrent itching localized to the clitoral area since September - Initial episode occurred a year and a half ago, diagnosed as yeast infection and treated with Diflucan  - Symptoms persist despite two rounds of Diflucan  and a seven-day course of over-the-counter Monistat - Itching resumes seven to ten days after completion of treatment - Associated sensation of rawness in the perineal area - Significant discomfort and urge to scratch - No pain in the affected areas - No changes in detergents or other potential irritants - No new sexual partners; has been with current partner for 52 years - Use of Vaseline for moisture did not alleviate symptoms  Hemorrhoids - History of hemorrhoids - Does not believe hemorrhoids are related to current symptoms         07/24/2024   12:55 PM 04/24/2024    5:08 PM 09/25/2023    9:04 AM  Depression screen PHQ 2/9  Decreased Interest 0 0 0  Down, Depressed, Hopeless 0 0 0  PHQ - 2 Score 0 0 0    Relevant past medical, surgical, family and social history reviewed and updated as indicated. Interim medical history since our last visit reviewed. Allergies and medications reviewed and updated.  Review of Systems  Ten systems reviewed and is negative except as mentioned in HPI      Objective:       BP 134/74   Pulse 80   Resp 16   Ht 5' 5 (1.651 m)   Wt 140 lb 3.2 oz (63.6 kg)   SpO2 97%   BMI 23.33 kg/m    Wt Readings from Last 3 Encounters:  07/24/24 140 lb 3.2 oz (63.6 kg)  05/20/24 140 lb 6.4 oz (63.7 kg)  04/24/24 141 lb 12.8 oz (64.3 kg)    Physical Exam GENERAL: Alert, cooperative, well developed, no acute distress. HEENT: Normocephalic, normal oropharynx, moist mucous membranes. CHEST: Clear to auscultation bilaterally, no wheezes, rhonchi, or crackles. CARDIOVASCULAR: Normal heart rate and rhythm, S1 and S2 normal without murmurs. ABDOMEN: Soft, non-tender, non-distended, without organomegaly, normal bowel sounds. GENITOURINARY: Peeling white skin on external vagina, no tumors or growths, irritation on external vagina, white, dry, flaky skin on perineum. EXTREMITIES: No cyanosis or edema. NEUROLOGICAL: Cranial nerves grossly intact, moves all extremities without gross motor or sensory deficit.  Results for orders placed or performed in visit on 09/25/23  Lipid Profile   Collection Time: 09/25/23  9:50 AM  Result Value Ref Range   Cholesterol 187 0 - 200 mg/dL   Triglycerides 39.9 0.0 - 149.0 mg/dL   HDL 46.69 >60.99 mg/dL   VLDL 87.9 0.0 - 59.9 mg/dL   LDL Cholesterol 878 (H) 0 -  99 mg/dL   Total CHOL/HDL Ratio 4    NonHDL 133.37   Direct LDL   Collection Time: 09/25/23  9:50 AM  Result Value Ref Range   Direct LDL 126.0 mg/dL  Comp Met (CMET)   Collection Time: 09/25/23  9:50 AM  Result Value Ref Range   Sodium 137 135 - 145 mEq/L   Potassium 4.2 3.5 - 5.1 mEq/L   Chloride 102 96 - 112 mEq/L   CO2 28 19 - 32 mEq/L   Glucose, Bld 92 70 - 99 mg/dL   BUN 16 6 - 23 mg/dL   Creatinine, Ser 9.20 0.40 - 1.20 mg/dL   Total Bilirubin 0.6 0.2 - 1.2 mg/dL   Alkaline Phosphatase 70 39 - 117 U/L   AST 21 0 - 37 U/L   ALT 17 0 - 35 U/L   Total Protein 6.6 6.0 - 8.3 g/dL   Albumin 4.5 3.5 - 5.2 g/dL   GFR 23.90 >39.99 mL/min   Calcium  9.1 8.4 - 10.5  mg/dL  TSH   Collection Time: 09/25/23  9:50 AM  Result Value Ref Range   TSH 2.03 0.35 - 5.50 uIU/mL  Hemoglobin A1c   Collection Time: 09/25/23  9:50 AM  Result Value Ref Range   Hgb A1c MFr Bld 5.5 4.6 - 6.5 %  Urinalysis, Routine w reflex microscopic   Collection Time: 09/25/23  9:50 AM  Result Value Ref Range   Color, Urine YELLOW Yellow;Lt. Yellow;Straw;Dark Yellow;Amber;Green;Red;Brown   APPearance CLEAR Clear;Turbid;Slightly Cloudy;Cloudy   Specific Gravity, Urine 1.020 1.000 - 1.030   pH 6.0 5.0 - 8.0   Total Protein, Urine NEGATIVE Negative   Urine Glucose NEGATIVE Negative   Ketones, ur TRACE (A) Negative   Bilirubin Urine NEGATIVE Negative   Hgb urine dipstick NEGATIVE Negative   Urobilinogen, UA 0.2 0.0 - 1.0   Leukocytes,Ua NEGATIVE Negative   Nitrite NEGATIVE Negative   WBC, UA 0-2/hpf 0-2/hpf   RBC / HPF none seen 0-2/hpf   Squamous Epithelial / HPF Rare(0-4/hpf) Rare(0-4/hpf)          Assessment & Plan:   Problem List Items Addressed This Visit   None Visit Diagnoses       Vaginal itching    -  Primary   Relevant Medications   nystatin -triamcinolone  (MYCOLOG II) cream   Other Relevant Orders   Cervicovaginal ancillary only        Assessment and Plan Assessment & Plan Vulvar and vaginal pruritus, rule out lichen sclerosus versus recurrent candidiasis 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 Previous treatments with Diflucan  and Monistat provided temporary relief. Differential diagnosis includes lichen sclerosus and recurrent candidiasis. Examination revealed whitening and irritation, with no tumors or growths. Lichen sclerosus is considered due to age-related skin changes and symptoms. Awaiting swab results to confirm diagnosis. - Obtained vaginal swab for yeast culture. - Prescribed antifungal and steroid cream for topical application twice daily. - Advised to monitor symptoms and  report improvement by Monday. - If yeast culture is negative and symptoms persist, will consider referral to GYN for biopsy to rule out lichen sclerosus.        Follow up plan: Return if symptoms worsen or fail to improve, for follow up with GYN if no improvement. "

## 2024-07-27 ENCOUNTER — Telehealth: Payer: Self-pay | Admitting: Pharmacy Technician

## 2024-07-27 ENCOUNTER — Other Ambulatory Visit (HOSPITAL_COMMUNITY): Payer: Self-pay

## 2024-07-27 NOTE — Telephone Encounter (Signed)
 Pharmacy Patient Advocate Encounter   Received notification from [employer] that prior authorization for Nystatin -Triamcinolone  100000-0.1UNIT/GM-% cream is required/requested.   Insurance verification completed.   The patient is insured through [facility] MEDICARE.   Per test claim: Per test claim, medication is not covered due to product not on formualry, closed formulary, PA not submitted at this time   **Test billed clotrimazole-betamethasone cream (Lotrisone) and it went through insurance and copay is $16.18**

## 2024-07-28 LAB — CERVICOVAGINAL ANCILLARY ONLY
Bacterial Vaginitis (gardnerella): POSITIVE — AB
Candida Glabrata: NEGATIVE
Candida Vaginitis: NEGATIVE
Chlamydia: NEGATIVE
Comment: NEGATIVE
Comment: NEGATIVE
Comment: NEGATIVE
Comment: NEGATIVE
Comment: NEGATIVE
Comment: NORMAL
Neisseria Gonorrhea: NEGATIVE
Trichomonas: NEGATIVE

## 2024-07-29 ENCOUNTER — Encounter: Payer: Self-pay | Admitting: Nurse Practitioner

## 2024-07-29 ENCOUNTER — Other Ambulatory Visit: Payer: Self-pay | Admitting: Nurse Practitioner

## 2024-07-29 ENCOUNTER — Ambulatory Visit: Payer: Self-pay | Admitting: Nurse Practitioner

## 2024-07-29 DIAGNOSIS — B9689 Other specified bacterial agents as the cause of diseases classified elsewhere: Secondary | ICD-10-CM

## 2024-07-29 MED ORDER — CLINDAMYCIN HCL 300 MG PO CAPS: 300.0000 mg | ORAL_CAPSULE | Freq: Two times a day (BID) | ORAL | 0 refills | Status: AC

## 2024-07-29 MED ORDER — METRONIDAZOLE 500 MG PO TABS: 500.0000 mg | ORAL_TABLET | Freq: Two times a day (BID) | ORAL | 0 refills | Status: DC

## 2024-07-30 ENCOUNTER — Ambulatory Visit (INDEPENDENT_AMBULATORY_CARE_PROVIDER_SITE_OTHER): Admitting: Adult Health

## 2024-07-30 VITALS — BP 139/81 | HR 78

## 2024-07-30 DIAGNOSIS — G43709 Chronic migraine without aura, not intractable, without status migrainosus: Secondary | ICD-10-CM

## 2024-07-30 DIAGNOSIS — G43009 Migraine without aura, not intractable, without status migrainosus: Secondary | ICD-10-CM

## 2024-07-30 MED ORDER — ONABOTULINUMTOXINA 200 UNITS IJ SOLR
155.0000 [IU] | Freq: Once | INTRAMUSCULAR | Status: AC
  Administered 2024-07-30: 155 [IU] via INTRAMUSCULAR

## 2024-07-30 NOTE — Progress Notes (Signed)
 "    Update 07/30/2024 JM: Returns for repeat Botox .  Prior Botox  05/07/2024.  Reports continued benefit with Botox  with >50% migraine reduction.  She did have a migraine recently but also had bilateral cataract procedures and colonoscopy over the past month.  Migraines resolved after use of rizatriptan .   Tolerated procedure well.  Return in 3 months for repeat injection.    Consent Form Botulism Toxin Injection For Chronic Migraine    Reviewed orally with patient, additionally signature is on file:  Botulism toxin has been approved by the Federal drug administration for treatment of chronic migraine. Botulism toxin does not cure chronic migraine and it may not be effective in some patients.  The administration of botulism toxin is accomplished by injecting a small amount of toxin into the muscles of the neck and head. Dosage must be titrated for each individual. Any benefits resulting from botulism toxin tend to wear off after 3 months with a repeat injection required if benefit is to be maintained. Injections are usually done every 3-4 months with maximum effect peak achieved by about 2 or 3 weeks. Botulism toxin is expensive and you should be sure of what costs you will incur resulting from the injection.  The side effects of botulism toxin use for chronic migraine may include:   -Transient, and usually mild, facial weakness with facial injections  -Transient, and usually mild, head or neck weakness with head/neck injections  -Reduction or loss of forehead facial animation due to forehead muscle weakness  -Eyelid drooping  -Dry eye  -Pain at the site of injection or bruising at the site of injection  -Double vision  -Potential unknown long term risks   Contraindications: You should not have Botox  if you are pregnant, nursing, allergic to albumin, have an infection, skin condition, or muscle weakness at the site of the injection, or have myasthenia gravis, Lambert-Eaton syndrome, or  ALS.  It is also possible that as with any injection, there may be an allergic reaction or no effect from the medication. Reduced effectiveness after repeated injections is sometimes seen and rarely infection at the injection site may occur. All care will be taken to prevent these side effects. If therapy is given over a long time, atrophy and wasting in the muscle injected may occur. Occasionally the patient's become refractory to treatment because they develop antibodies to the toxin. In this event, therapy needs to be modified.  I have read the above information and consent to the administration of botulism toxin.    BOTOX  PROCEDURE NOTE FOR MIGRAINE HEADACHE  Contraindications and precautions discussed with patient(above). Aseptic procedure was observed and patient tolerated procedure. Procedure performed by Harlene Bogaert, AGNP-BC.   The condition has existed for more than 6 months, and pt does not have a diagnosis of ALS, Myasthenia Gravis or Lambert-Eaton Syndrome.  Risks and benefits of injections discussed and pt agrees to proceed with the procedure.  Written consent obtained  These injections are medically necessary. Pt  receives good benefits from these injections. These injections do not cause sedations or hallucinations which the oral therapies may cause.   Description of procedure:  The patient was placed in a sitting position. The standard protocol was used for Botox  as follows, with 5 units of Botox  injected at each site:  -Procerus muscle, midline injection  -Corrugator muscle, bilateral injection  -Frontalis muscle, bilateral injection, with 2 sites each side, medial injection was performed in the upper one third of the frontalis muscle, in the region  vertical from the medial inferior edge of the superior orbital rim. The lateral injection was again in the upper one third of the forehead vertically above the lateral limbus of the cornea, 1.5 cm lateral to the medial  injection site.  -Temporalis muscle injection, 4 sites, bilaterally. The first injection was 3 cm above the tragus of the ear, second injection site was 1.5 cm to 3 cm up from the first injection site in line with the tragus of the ear. The third injection site was 1.5-3 cm forward between the first 2 injection sites. The fourth injection site was 1.5 cm posterior to the second injection site. 5th site laterally in the temporalis  muscleat the level of the outer canthus.  -Occipitalis muscle injection, 3 sites, bilaterally. The first injection was done one half way between the occipital protuberance and the tip of the mastoid process behind the ear. The second injection site was done lateral and superior to the first, 1 fingerbreadth from the first injection. The third injection site was 1 fingerbreadth superiorly and medially from the first injection site.  -Cervical paraspinal muscle injection, 2 sites, bilaterally. The first injection site was 1 cm from the midline of the cervical spine, 3 cm inferior to the lower border of the occipital protuberance. The second injection site was 1.5 cm superiorly and laterally to the first injection site.  -Trapezius muscle injection was performed at 3 sites, bilaterally. The first injection site was in the upper trapezius muscle halfway between the inflection point of the neck, and the acromion. The second injection site was one half way between the acromion and the first injection site. The third injection was done between the first injection site and the inflection point of the neck.    A total of 200 units of Botox  was prepared, 155 units of Botox  was injected as documented above, any Botox  not injected was wasted. The patient tolerated the procedure well, there were no complications of the above procedure.   Harlene Bogaert, AGNP-BC  [facility] [address] [place], [place] [address]  Phone [phone] Fax  [phone] Note: This document was prepared with digital dictation and possible smart phrase technology. Any transcriptional errors that result from this process are unintentional.    "

## 2024-07-30 NOTE — Progress Notes (Signed)
 Botox - 200 units x 1 vial Lot: [id number] Expiration: 2028/3 NDC: 0023-3921-02   Bacteriostatic 0.9% Sodium Chloride - 4 mL  Lot: [id number] Expiration: 05/08/25 NDC: [id number]   Dx: H56.290 B/B Witnessed by: Diandra CMA

## 2024-08-03 ENCOUNTER — Ambulatory Visit: Admitting: Adult Health

## 2024-08-25 ENCOUNTER — Ambulatory Visit: Attending: Obstetrics and Gynecology | Admitting: Physical Therapy

## 2024-09-25 ENCOUNTER — Encounter: Admitting: Internal Medicine

## 2024-10-28 ENCOUNTER — Ambulatory Visit: Admitting: Adult Health

## 2024-11-04 ENCOUNTER — Ambulatory Visit: Admitting: Dermatology

## 2025-05-24 ENCOUNTER — Ambulatory Visit: Admitting: Neurology
